# Patient Record
Sex: Male | Born: 1980 | Race: White | Hispanic: No | Marital: Married | Smoking: Former smoker
Health system: Southern US, Community
[De-identification: ages and names within clinical notes are randomized; demographics above are authoritative.]

## PROBLEM LIST (undated history)

## (undated) DIAGNOSIS — C719 Malignant neoplasm of brain, unspecified: Secondary | ICD-10-CM

## (undated) DIAGNOSIS — R569 Unspecified convulsions: Secondary | ICD-10-CM

## (undated) DIAGNOSIS — S82891A Other fracture of right lower leg, initial encounter for closed fracture: Secondary | ICD-10-CM

## (undated) DIAGNOSIS — K8020 Calculus of gallbladder without cholecystitis without obstruction: Secondary | ICD-10-CM

## (undated) HISTORY — DX: Malignant neoplasm of brain, unspecified: C71.9

## (undated) HISTORY — DX: Other fracture of right lower leg, initial encounter for closed fracture: S82.891A

## (undated) HISTORY — PX: ANKLE SURGERY: SHX546

## (undated) HISTORY — DX: Unspecified convulsions: R56.9

## (undated) HISTORY — PX: OTHER SURGICAL HISTORY: SHX169

---
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
## 2002-06-21 ENCOUNTER — Emergency Department (HOSPITAL_COMMUNITY): Admission: EM | Admit: 2002-06-21 | Discharge: 2002-06-22 | Payer: Self-pay | Admitting: Emergency Medicine

## 2004-05-20 ENCOUNTER — Ambulatory Visit: Payer: Self-pay | Admitting: Family Medicine

## 2004-07-24 ENCOUNTER — Ambulatory Visit: Payer: Self-pay | Admitting: Family Medicine

## 2004-10-31 ENCOUNTER — Emergency Department (HOSPITAL_COMMUNITY): Admission: EM | Admit: 2004-10-31 | Discharge: 2004-11-01 | Payer: Self-pay

## 2005-01-06 ENCOUNTER — Ambulatory Visit: Payer: Self-pay | Admitting: Family Medicine

## 2005-01-20 ENCOUNTER — Ambulatory Visit: Payer: Self-pay | Admitting: Family Medicine

## 2005-03-19 ENCOUNTER — Ambulatory Visit: Payer: Self-pay | Admitting: Family Medicine
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
## 2005-07-13 ENCOUNTER — Ambulatory Visit: Payer: Self-pay | Admitting: Family Medicine

## 2005-07-30 ENCOUNTER — Emergency Department (HOSPITAL_COMMUNITY): Admission: EM | Admit: 2005-07-30 | Discharge: 2005-07-30 | Payer: Self-pay | Admitting: Emergency Medicine

## 2005-09-02 ENCOUNTER — Ambulatory Visit: Payer: Self-pay | Admitting: Family Medicine

## 2005-11-12 ENCOUNTER — Ambulatory Visit: Payer: Self-pay | Admitting: Family Medicine

## 2006-08-18 ENCOUNTER — Ambulatory Visit: Payer: Self-pay | Admitting: Family Medicine

## 2006-12-20 ENCOUNTER — Encounter (INDEPENDENT_AMBULATORY_CARE_PROVIDER_SITE_OTHER): Payer: Self-pay | Admitting: Internal Medicine

## 2007-01-26 ENCOUNTER — Ambulatory Visit: Payer: Self-pay | Admitting: Family Medicine

## 2007-01-26 DIAGNOSIS — J019 Acute sinusitis, unspecified: Secondary | ICD-10-CM

## 2007-04-25 ENCOUNTER — Ambulatory Visit: Payer: Self-pay | Admitting: Family Medicine

## 2007-07-20 DIAGNOSIS — S82891A Other fracture of right lower leg, initial encounter for closed fracture: Secondary | ICD-10-CM

## 2007-07-20 HISTORY — DX: Other fracture of right lower leg, initial encounter for closed fracture: S82.891A

## 2007-08-02 ENCOUNTER — Ambulatory Visit: Payer: Self-pay | Admitting: Family Medicine

## 2007-08-02 DIAGNOSIS — F17200 Nicotine dependence, unspecified, uncomplicated: Secondary | ICD-10-CM

## 2007-12-19 ENCOUNTER — Encounter (INDEPENDENT_AMBULATORY_CARE_PROVIDER_SITE_OTHER): Payer: Self-pay | Admitting: Internal Medicine

## 2008-02-03 ENCOUNTER — Encounter: Payer: Self-pay | Admitting: Family Medicine

## 2008-02-03 ENCOUNTER — Emergency Department (HOSPITAL_COMMUNITY): Admission: EM | Admit: 2008-02-03 | Discharge: 2008-02-03 | Payer: Self-pay | Admitting: Emergency Medicine

## 2008-02-15 ENCOUNTER — Ambulatory Visit: Payer: Self-pay | Admitting: Family Medicine

## 2008-02-15 ENCOUNTER — Encounter: Admission: RE | Admit: 2008-02-15 | Discharge: 2008-02-15 | Payer: Self-pay | Admitting: Family Medicine

## 2008-02-15 DIAGNOSIS — J189 Pneumonia, unspecified organism: Secondary | ICD-10-CM

## 2008-02-15 DIAGNOSIS — R109 Unspecified abdominal pain: Secondary | ICD-10-CM | POA: Insufficient documentation

## 2008-03-14 ENCOUNTER — Ambulatory Visit: Payer: Self-pay | Admitting: Gastroenterology

## 2008-03-28 ENCOUNTER — Ambulatory Visit: Payer: Self-pay | Admitting: Gastroenterology

## 2008-03-28 ENCOUNTER — Encounter: Payer: Self-pay | Admitting: Gastroenterology

## 2008-03-29 ENCOUNTER — Telehealth: Payer: Self-pay | Admitting: Gastroenterology

## 2008-04-01 ENCOUNTER — Telehealth: Payer: Self-pay | Admitting: Gastroenterology

## 2008-04-01 ENCOUNTER — Encounter: Payer: Self-pay | Admitting: Gastroenterology

## 2008-04-08 ENCOUNTER — Ambulatory Visit: Payer: Self-pay | Admitting: Family Medicine

## 2008-05-10 ENCOUNTER — Ambulatory Visit: Payer: Self-pay | Admitting: Gastroenterology

## 2008-07-08 ENCOUNTER — Ambulatory Visit: Payer: Self-pay | Admitting: Family Medicine

## 2008-07-08 ENCOUNTER — Encounter: Payer: Self-pay | Admitting: Internal Medicine

## 2008-07-08 DIAGNOSIS — J302 Other seasonal allergic rhinitis: Secondary | ICD-10-CM | POA: Insufficient documentation

## 2008-07-08 DIAGNOSIS — J45909 Unspecified asthma, uncomplicated: Secondary | ICD-10-CM | POA: Insufficient documentation

## 2008-07-08 DIAGNOSIS — J069 Acute upper respiratory infection, unspecified: Secondary | ICD-10-CM | POA: Insufficient documentation

## 2008-07-08 DIAGNOSIS — F988 Other specified behavioral and emotional disorders with onset usually occurring in childhood and adolescence: Secondary | ICD-10-CM | POA: Insufficient documentation

## 2008-09-26 ENCOUNTER — Ambulatory Visit: Payer: Self-pay | Admitting: Family Medicine

## 2008-11-30 ENCOUNTER — Emergency Department (HOSPITAL_COMMUNITY): Admission: EM | Admit: 2008-11-30 | Discharge: 2008-11-30 | Payer: Self-pay | Admitting: Family Medicine

## 2009-01-08 ENCOUNTER — Ambulatory Visit: Payer: Self-pay | Admitting: Family Medicine

## 2009-03-11 ENCOUNTER — Encounter (INDEPENDENT_AMBULATORY_CARE_PROVIDER_SITE_OTHER): Payer: Self-pay | Admitting: Internal Medicine

## 2009-05-08 ENCOUNTER — Telehealth: Payer: Self-pay | Admitting: Family Medicine

## 2009-05-09 ENCOUNTER — Ambulatory Visit: Payer: Self-pay | Admitting: Family Medicine

## 2010-02-12 ENCOUNTER — Encounter: Payer: Self-pay | Admitting: Family Medicine

## 2010-08-18 NOTE — Letter (Signed)
Summary: [facility]  [facility]   Imported By: Lanelle Bal 02/24/2010 11:54:16  _____________________________________________________________________  External Attachment:    Type:   Image     Comment:   External Document

## 2010-10-20 ENCOUNTER — Inpatient Hospital Stay (INDEPENDENT_AMBULATORY_CARE_PROVIDER_SITE_OTHER)
Admission: RE | Admit: 2010-10-20 | Discharge: 2010-10-20 | Disposition: A | Discharge status: Home / Self Care | Payer: BC Managed Care – PPO | Source: Ambulatory Visit | Attending: Emergency Medicine | Admitting: Emergency Medicine

## 2010-10-20 ENCOUNTER — Ambulatory Visit (INDEPENDENT_AMBULATORY_CARE_PROVIDER_SITE_OTHER): Payer: BC Managed Care – PPO

## 2010-10-20 DIAGNOSIS — S335XXA Sprain of ligaments of lumbar spine, initial encounter: Secondary | ICD-10-CM

## 2010-10-20 LAB — POCT URINALYSIS DIP (DEVICE): Specific Gravity, Urine: 1.015 (ref 1.005–1.030)

## 2011-01-22 ENCOUNTER — Encounter: Payer: Self-pay | Admitting: Family Medicine

## 2011-01-22 ENCOUNTER — Ambulatory Visit (INDEPENDENT_AMBULATORY_CARE_PROVIDER_SITE_OTHER): Payer: BC Managed Care – PPO | Admitting: Family Medicine

## 2011-01-22 DIAGNOSIS — R197 Diarrhea, unspecified: Secondary | ICD-10-CM | POA: Insufficient documentation

## 2011-01-22 NOTE — Assessment & Plan Note (Signed)
Suspect viral gastroenteritis Reassuring exam Rev bland / brat diet with sips of fluids and rev s/s dehydration to watch for  Adv to call/ f/u if abd pain/fever/ worse sympt or no imp next week

## 2011-01-22 NOTE — Patient Instructions (Signed)
I think you have a GI virus  Eat bland diet BRAT -- bananas / rice/ applesauce/ toast  Sips of fluids all day long to prevent dehydration  If you feel dehydrated - rapid heart rate/ dizzy/ weak -- go to urgent care or ER to get fluids  Let us know next week if not improving  immodium AD is ok - for 2-3 days maximum

## 2011-01-22 NOTE — Progress Notes (Signed)
Subjective:    Patient ID: Corey Chang, male    DOB: 01-09-1981, 30 y.o.   MRN: [id number]  HPI Diarrhea started yest am  Felt a little bad on wed nt- a bit [employer] on to work and had to come twice - diarrhea was so bad  BM every 2 hours - just watery  Some blood when wiping from hemorroids Some abd cramping -- and also soreness all the time -- is uncomfortable -- really the whole abdomen equally No fever  Little queasy- but no vomiting  Is hungry but if he eats - has to run him to the bathroom  Is drinking fluids well - sprite and water   Ate BBQ chicken on wed -- but felt a little weird before that Got real hot too  No contacts with diarrhea -- and no one else got sick   No headache or tick bite or rash   No abx or new meds lately  Was on aleve and zyrtec D for congestion- feels like ear was clogged up   Patient Active Problem List  Diagnoses  . MALIGNANT NEOPLASM OF VENTRICLES OF BRAIN  . SMOKELESS TOBACCO ABUSE  . ADD  . SINUSITIS, ACUTE  . URI  . ALLERGIC RHINITIS  . PNEUMONIA  . ASTHMA  . SEIZURE DISORDER, COMPLEX PARTIAL  . ABDOMINAL PAIN  . Diarrhea   Past Medical History  Diagnosis Date  . Ankle fracture, right 07/2007  . History of brain tumor   . Aneurysm     history of  . Allergic rhinitis 04/22/97  . Asthma 04/22/97  . History of MRI 10/23/99, 01/18/00    head, stable   Past Surgical History  Procedure Date  . Ankle surgery     right, x 3  . Brain biopsy 1997, 2001   History  Substance Use Topics  . Smoking status: Former Games developer  . Smokeless tobacco: Current User    Types: Chew  . Alcohol Use: Yes     2 beers per week   Family History  Problem Relation Age of Onset  . Heart disease Maternal Grandfather     MI  . Parkinsonism Paternal Grandmother   . Alzheimer's disease Paternal Grandmother   . Depression Neg Hx   . Drug abuse Neg Hx   . Alcohol abuse Neg Hx   . Cancer Neg Hx     no colon, prostate, breast, uterine,ovarian    Allergies  Allergen Reactions  . Azithromycin     REACTION: NAUSEA; DIARRHEA  . Guaifenesin     REACTION: unspecified  . Sulfonamide Derivatives     REACTION: unspecified   No current outpatient prescriptions on file prior to visit.        Review of Systems Review of Systems  Constitutional: Negative for fever, appetite change, fatigue and unexpected weight change.  Eyes: Negative for pain and visual disturbance. ENT pos for ear congestion/ allergies   Respiratory: Negative for cough and shortness of breath.   Cardiovascular: Negative.  for cp or sob Gastrointestinal: Negative for severe abd pain / vomiting / jaundice  Genitourinary: Negative for urgency and frequency.  Skin: Negative for pallor.  Neurological: Negative for weakness, light-headedness, numbness and headaches.  Hematological: Negative for adenopathy. Does not bruise/bleed easily.  Psychiatric/Behavioral: Negative for dysphoric mood. The patient is not nervous/anxious.          Objective:   Physical Exam  Constitutional: He appears well-developed and well-nourished. No distress.  HENT:  Head: Normocephalic and  atraumatic.  Right Ear: External ear normal.  Left Ear: External ear normal.  Mouth/Throat: Oropharynx is clear and moist.       Nares are pale and boggy  Eyes: Conjunctivae and EOM are normal. Pupils are equal, round, and reactive to light. No scleral icterus.  Neck: Normal range of motion. Neck supple. No JVD present. No thyromegaly present.  Cardiovascular: Normal rate, regular rhythm and normal heart sounds.   Pulmonary/Chest: Breath sounds normal. No respiratory distress. He has no wheezes.  Abdominal: Soft. He exhibits no distension and no mass. There is no tenderness. There is no rebound and no guarding.       Mildly hyperactive bs without high pitched/ tinkling noises  Lymphadenopathy:    He has no cervical adenopathy.  Neurological: He is alert. He has normal reflexes.  Skin: Skin is  warm and dry. No rash noted. No erythema. No pallor.       Brisk cap refil time  Psychiatric: He has a normal mood and affect.          Assessment & Plan:

## 2011-04-16 LAB — URINALYSIS, ROUTINE W REFLEX MICROSCOPIC
Glucose, UA: NEGATIVE
Hgb urine dipstick: NEGATIVE
Specific Gravity, Urine: 1.024

## 2011-04-16 LAB — COMPREHENSIVE METABOLIC PANEL
AST: 25
Albumin: 4
Alkaline Phosphatase: 84
BUN: 7
CO2: 27
Chloride: 105
Creatinine, Ser: 0.74
GFR calc Af Amer: >60
GFR calc non Af Amer: >60
Potassium: 3.7
Total Bilirubin: 0.6

## 2011-04-16 LAB — LIPASE, BLOOD: Lipase: 25

## 2011-04-16 LAB — CBC
HCT: 48.1
MCV: 85.8
RBC: 5.61
WBC: 10.2

## 2011-04-16 LAB — DIFFERENTIAL
Basophils Absolute: 0
Basophils Relative: 0
Eosinophils Relative: 4
Lymphocytes Relative: 15
Monocytes Absolute: 1.2 — ABNORMAL HIGH

## 2011-10-14 ENCOUNTER — Encounter: Payer: Self-pay | Admitting: Internal Medicine

## 2011-10-14 ENCOUNTER — Ambulatory Visit (INDEPENDENT_AMBULATORY_CARE_PROVIDER_SITE_OTHER): Payer: BC Managed Care – PPO | Admitting: Internal Medicine

## 2011-10-14 VITALS — BP 120/80 | HR 79 | Temp 98.0°F | Ht 71.0 in | Wt 192.0 lb

## 2011-10-14 DIAGNOSIS — C719 Malignant neoplasm of brain, unspecified: Secondary | ICD-10-CM | POA: Insufficient documentation

## 2011-10-14 DIAGNOSIS — Z23 Encounter for immunization: Secondary | ICD-10-CM

## 2011-10-14 DIAGNOSIS — J45909 Unspecified asthma, uncomplicated: Secondary | ICD-10-CM | POA: Insufficient documentation

## 2011-10-14 MED ORDER — PREDNISONE 20 MG PO TABS: 40.0000 mg | ORAL_TABLET | Freq: Every day | ORAL | Status: AC

## 2011-10-14 MED ORDER — MONTELUKAST SODIUM 10 MG PO TABS: 10.0000 mg | ORAL_TABLET | Freq: Every day | ORAL | Status: DC

## 2011-10-14 NOTE — Assessment & Plan Note (Signed)
Doesn't have apparent asthma---just pollen related Will give 5 days of prednisone and start singulair Continue the other meds

## 2011-10-14 NOTE — Progress Notes (Signed)
Subjective:    Patient ID: Corey Chang, male    DOB: [birth year], 31 y.o.   MRN: [id number]  HPI Has had wheezing in chest and cough Goes back 2 months Mostly irritating Mostly in Am and then better after taking allergy meds  No fever Doesn't smoke Doesn't want to give up smokeless tobacco No sore throat Did get biaxin a month ago for ear infection---walk in clinic This only helped ear  Not SOB--unless he pushes himself (and no recent change in his stamina) Has proair inhaler--this has helped  Current Outpatient Prescriptions on File Prior to Visit  Medication Sig Dispense Refill  . fexofenadine (ALLEGRA) 180 MG tablet Take 180 mg by mouth daily.        . fluticasone (FLONASE) 50 MCG/ACT nasal spray 2 sprays each nostril every morning       . levETIRAcetam (KEPPRA) 500 MG tablet Take 500 mg by mouth every 12 (twelve) hours.          Allergies  Allergen Reactions  . Azithromycin     REACTION: NAUSEA; DIARRHEA  . Guaifenesin     REACTION: unspecified  . Sulfonamide Derivatives     REACTION: unspecified    Past Medical History  Diagnosis Date  . Ankle fracture, right 07/2007  . History of brain tumor   . Aneurysm     history of  . Allergic rhinitis 04/22/97  . Asthma 04/22/97  . History of MRI 10/23/99, 01/18/00    head, stable    Past Surgical History  Procedure Date  . Ankle surgery     right, x 3  . Brain biopsy 1997, 2001    Family History  Problem Relation Age of Onset  . Heart disease Maternal Grandfather     MI  . Parkinsonism Paternal Grandmother   . Alzheimer's disease Paternal Grandmother   . Depression Neg Hx   . Drug abuse Neg Hx   . Alcohol abuse Neg Hx   . Cancer Neg Hx     no colon, prostate, breast, uterine,ovarian    History   Social History  . Marital Status: Married    Spouse Name: N/A    Number of Children: 0  . Years of Education: N/A   Occupational History  . sign erector     DOT for [employer]   Social History Main Topics  . Smoking  status: Former Games developer  . Smokeless tobacco: Current User    Types: Chew  . Alcohol Use: Yes     2 beers per week  . Drug Use: No  . Sexually Active: Not on file   Other Topics Concern  . 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 Patient gets regular exercise.   Review of Systems No vomiting or diarrhea Appetite is fine Did have asthma as child---told only "allergic asthma" by Dr [place] Callas    Objective:   Physical Exam  Constitutional: He appears well-developed and well-nourished. No distress.  HENT:  Mouth/Throat: Oropharynx is clear and moist. No oropharyngeal exudate.       TMs normal Mild nasal congestion  Neck: Normal range of motion. Neck supple.  Pulmonary/Chest: Effort normal and breath sounds normal. No respiratory distress. He has no wheezes. He has no rales.  Lymphadenopathy:    He has no cervical adenopathy.  Skin:       Small well apposed laceration to left thumb. No inflammation          Assessment & Plan:

## 2011-10-14 NOTE — Assessment & Plan Note (Signed)
Discussed that he had biopsy and then RT when he was 19  Due for follow up Doctor in [place] is not there anymore Will set up with oncology locally

## 2011-10-26 ENCOUNTER — Encounter: Payer: Self-pay | Admitting: Internal Medicine

## 2011-10-26 ENCOUNTER — Ambulatory Visit (INDEPENDENT_AMBULATORY_CARE_PROVIDER_SITE_OTHER): Payer: BC Managed Care – PPO | Admitting: Internal Medicine

## 2011-10-26 VITALS — BP 110/70 | HR 87 | Temp 98.3°F | Wt 191.0 lb

## 2011-10-26 DIAGNOSIS — J019 Acute sinusitis, unspecified: Secondary | ICD-10-CM

## 2011-10-26 DIAGNOSIS — J45909 Unspecified asthma, uncomplicated: Secondary | ICD-10-CM

## 2011-10-26 MED ORDER — AMOXICILLIN-POT CLAVULANATE 875-125 MG PO TABS: 1.0000 | ORAL_TABLET | Freq: Two times a day (BID) | ORAL | Status: AC

## 2011-10-26 NOTE — Patient Instructions (Signed)
Call next week if the sinus infection isn't much better

## 2011-10-26 NOTE — Progress Notes (Signed)
  Subjective:    Patient ID: Corey Chang, male    DOB: Aug 13, 1980, 31 y.o.   MRN: [id number]  HPI Seemed to be better at first Now with productive yellow mucus Some wheezing which he relates to the mucus Breathing is better on the singulair  No fever Still has sinus congestion Pain in ears Still with cough  Current Outpatient Prescriptions on File Prior to Visit  Medication Sig Dispense Refill  . fexofenadine (ALLEGRA) 180 MG tablet Take 180 mg by mouth daily.        . fluticasone (FLONASE) 50 MCG/ACT nasal spray 2 sprays each nostril every morning       . levETIRAcetam (KEPPRA) 500 MG tablet Take 500 mg by mouth every 12 (twelve) hours.        . montelukast (SINGULAIR) 10 MG tablet Take 1 tablet (10 mg total) by mouth daily.  30 tablet  11    Allergies  Allergen Reactions  . Azithromycin     REACTION: NAUSEA; DIARRHEA  . Guaifenesin     REACTION: unspecified  . Sulfonamide Derivatives     REACTION: unspecified    Past Medical History  Diagnosis Date  . Ankle fracture, right 07/2007  . History of brain tumor   . Aneurysm     history of  . Allergic rhinitis 04/22/97  . Asthma 04/22/97  . History of MRI 10/23/99, 01/18/00    head, stable    Past Surgical History  Procedure Date  . Ankle surgery     right, x 3  . Brain biopsy 1997, 2001    Family History  Problem Relation Age of Onset  . Heart disease Maternal Grandfather     MI  . Parkinsonism Paternal Grandmother   . Alzheimer's disease Paternal Grandmother   . Depression Neg Hx   . Drug abuse Neg Hx   . Alcohol abuse Neg Hx   . Cancer Neg Hx     no colon, prostate, breast, uterine,ovarian    History   Social History  . Marital Status: Married    Spouse Name: N/A    Number of Children: 0  . Years of Education: N/A   Occupational History  . sign erector     DOT for [employer]   Social History Main Topics  . Smoking status: Former Games developer  . Smokeless tobacco: Current User    Types: Chew  . Alcohol Use: Yes       2 beers per week  . Drug Use: No  . Sexually Active: Not on file   Other Topics Concern  . 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 Patient gets regular exercise.   Review of Systems No rash Appetite is fine     Objective:   Physical Exam  Constitutional: He appears well-developed and well-nourished. No distress.  HENT:  Mouth/Throat: Oropharynx is clear and moist. No oropharyngeal exudate.       No sinus tenderness TMs non inflamed Marked nasal inflammation  Neck: Normal range of motion. Neck supple.  Pulmonary/Chest: Effort normal. No respiratory distress. He has no wheezes. He has no rales.       Some I/E squeeks  Lymphadenopathy:    He has no cervical adenopathy.          Assessment & Plan:

## 2011-10-26 NOTE — Assessment & Plan Note (Signed)
Now seems to have infection Will treat with augmentin then quinolone if not better by next week

## 2011-10-26 NOTE — Assessment & Plan Note (Signed)
Better on the montelukast

## 2011-11-09 ENCOUNTER — Telehealth: Payer: Self-pay | Admitting: *Deleted

## 2011-11-09 NOTE — Telephone Encounter (Signed)
patient confirmed appointment for 11-26-2011 starting at 10:00am

## 2011-11-10 ENCOUNTER — Telehealth: Payer: Self-pay | Admitting: Oncology

## 2011-11-10 NOTE — Telephone Encounter (Signed)
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

## 2011-11-24 ENCOUNTER — Encounter: Payer: Self-pay | Admitting: Oncology

## 2011-11-24 DIAGNOSIS — R569 Unspecified convulsions: Secondary | ICD-10-CM | POA: Insufficient documentation

## 2011-11-26 ENCOUNTER — Ambulatory Visit: Payer: BC Managed Care – PPO

## 2011-11-26 ENCOUNTER — Encounter: Payer: Self-pay | Admitting: Oncology

## 2011-11-26 ENCOUNTER — Ambulatory Visit (HOSPITAL_BASED_OUTPATIENT_CLINIC_OR_DEPARTMENT_OTHER): Payer: BC Managed Care – PPO | Admitting: Oncology

## 2011-11-26 ENCOUNTER — Telehealth: Payer: Self-pay | Admitting: Oncology

## 2011-11-26 ENCOUNTER — Other Ambulatory Visit (HOSPITAL_BASED_OUTPATIENT_CLINIC_OR_DEPARTMENT_OTHER): Payer: BC Managed Care – PPO | Admitting: Lab

## 2011-11-26 VITALS — BP 121/80 | HR 68 | Temp 97.5°F | Ht 69.5 in | Wt 192.3 lb

## 2011-11-26 DIAGNOSIS — R569 Unspecified convulsions: Secondary | ICD-10-CM

## 2011-11-26 DIAGNOSIS — Z85841 Personal history of malignant neoplasm of brain: Secondary | ICD-10-CM

## 2011-11-26 DIAGNOSIS — C719 Malignant neoplasm of brain, unspecified: Secondary | ICD-10-CM

## 2011-11-26 DIAGNOSIS — F17200 Nicotine dependence, unspecified, uncomplicated: Secondary | ICD-10-CM

## 2011-11-26 LAB — COMPREHENSIVE METABOLIC PANEL
AST: 28 U/L (ref 0–37)
Alkaline Phosphatase: 86 U/L (ref 39–117)
BUN: 9 mg/dL (ref 6–23)
Glucose, Bld: 75 mg/dL (ref 70–99)
Sodium: 139 mEq/L (ref 135–145)
Total Bilirubin: 0.6 mg/dL (ref 0.3–1.2)

## 2011-11-26 LAB — CBC WITH DIFFERENTIAL/PLATELET
BASO%: 2 % (ref 0.0–2.0)
Basophils Absolute: 0.1 10*3/uL (ref 0.0–0.1)
EOS%: 7.7 % — ABNORMAL HIGH (ref 0.0–7.0)
HGB: 17 g/dL (ref 13.0–17.1)
MCH: 29.5 pg (ref 27.2–33.4)
MCHC: 34.5 g/dL (ref 32.0–36.0)
MCV: 85.3 fL (ref 79.3–98.0)
MONO%: 12.9 % (ref 0.0–14.0)
NEUT%: 36.7 % — ABNORMAL LOW (ref 39.0–75.0)
RDW: 14.4 % (ref 11.0–14.6)
lymph#: 2.8 10*3/uL (ref 0.9–3.3)

## 2011-11-26 NOTE — Telephone Encounter (Signed)
appts made and printed for pt aom °

## 2011-11-26 NOTE — Consult Note (Signed)
Corey Chang  [phone] Fax:([phone]) [phone]   MEDICAL ONCOLOGY - INITIAL CONSULATION   Referral MD:  Dr. Hannah Chang, M.D.  Reason for Referral:  History of left temporal glioma.     HPI:   [name] is a 31 year-old man with history of left temporal glioma in 12/1999.  He has been lost to follow up with his original treating radiation oncologist Dr. Loralyn Chang from [facility].  His PCP recently realized that patient had not had follow up and thus kindly referred patient to the Cancer to establish care.   [name] had seizures and headache in Jan 1998.  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  He was evaluated by Dr. Casilda Chang.   He was followed with serial MRI's and was found to have persistent edema.  Thus, he underwent sterotactic biopsy of the lesion in 06/1998 with pathology showing low-grade glioma, grade I.  In April 2001, brain MRI showed interval development of enhancement in the existing nodules in the right thalamus and right cerebral peduncle with extension into the right medial temporal lobe.  He underwent on 01/06/2000 repeat biopsy at [facility] which showed low grade glioma.  He underwent adjuvant radiation therapy on Corey protocol Chang, total 54 Gy in 30 fractions.  He was randomized on this protocol to the arm that was supposed to receive chemotherapy; however, he declined.  He las saw Dr. Clelia Chang at [facility] on 12/29/2005 and was in remission at that time.  He was advised to have yearly follow up; however, he was lost to follow up.   [name] presented to the [facility] for the first time today with his aunt.  He reports feeling well.  His PCP recently attempted to taper down his Corey Chang; however, he developed seizure a few days after.  Thus, his Corey Chang dose was increased back to BID dosing; and he has been doing well.  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    Patient denies fatigue, fever, anorexia, weight loss, headache, visual changes, confusion, drenching night sweats, palpable lymph node swelling, mucositis, odynophagia, dysphagia, nausea vomiting, jaundice, chest pain, palpitation, dyspnea on exertion, productive cough, gum bleeding, epistaxis, hematemesis, hemoptysis, abdominal pain, abdominal swelling, early satiety, melena, hematochezia, hematuria, skin rash, spontaneous bleeding, joint swelling, joint pain, heat or cold intolerance, bowel bladder incontinence, back pain, focal motor weakness, paresthesia, depression, suicidal or homocidal ideation, feeling hopelessness.  He has mild wheezing from allergy but no increased work of breathing.     Past Medical History  Diagnosis Date  . Ankle fracture, right 07/2007  . 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  He had external beam radiation in August 2001 with 54 Gy in 30 fractions on Corey Chang protocol.   He declined chemotherapy.   . Aneurysm     history of that turned out to be oligodendroglioma  . Allergic rhinitis 04/22/97  . Asthma 04/22/97  . Seizure     from history of oligodendroglioma  :  Past Surgical History  Procedure Date  . Ankle surgery     right, x 3  . Brain biopsy 1997, 2001  :  Current Outpatient Prescriptions  Medication Sig Dispense Refill  . fexofenadine (ALLEGRA) 180 MG tablet Take 180 mg by mouth daily.        . fluticasone (  FLONASE) 50 MCG/ACT nasal spray 2 sprays each nostril every morning       . levETIRAcetam (KEPPRA) 500 MG tablet Take 500 mg by mouth every 12 (twelve) hours.        Marland Kitchen loratadine (CLARITIN) 10 MG tablet Take 10 mg by mouth daily.      . montelukast (SINGULAIR) 10 MG tablet Take 1 tablet (10 mg total) by mouth daily.  30 tablet  11  . PROAIR HFA 108 (90 BASE) MCG/ACT inhaler As needed         Allergies  Allergen Reactions  . Sulfonamide  Derivatives     REACTION: unspecified  :  Family History  Problem Relation Age of Onset  . Heart disease Maternal Grandfather     MI  . Parkinsonism Paternal Grandmother   . Alzheimer's disease Paternal Grandmother   . Depression Neg Hx   . Drug abuse Neg Hx   . Alcohol abuse Neg Hx   . Cancer Neg Hx     no colon, prostate, breast, uterine,ovarian  :  History   Social History  . Marital Status: Married    Spouse Name: N/A    Number of Children: 1  . Years of Education: N/A   Occupational History  . 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     Social History Main Topics  . Smoking status: Former Games developer  . Smokeless tobacco: Current User    Types: Chew  . Alcohol Use: Yes     2 beers per week  . Drug Use: No  . Sexually Active: Not on file   Other Topics Concern  . 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 Patient gets regular exercise.  :  Pertinent items are noted in HPI.  Exam: ECOG 0.   General:  well-nourished in no acute distress.  Eyes:  no scleral icterus.  ENT:  There were no oropharyngeal lesions.  Neck was without thyromegaly.  Lymphatics:  Negative cervical, supraclavicular or axillary adenopathy.  Respiratory: lungs were clear bilaterally without wheezing or crackles.  Cardiovascular:  Regular rate and rhythm, S1/S2, without murmur, rub or gallop.  There was no pedal edema.  GI:  abdomen was soft, flat, nontender, nondistended, without organomegaly.  Muscoloskeletal:  no spinal tenderness of palpation of vertebral spine.  Skin exam was without echymosis, petichae.  Neuro exam was nonfocal. CN II-XII grossly intact.  Motor strength 5/5 bilaterally.  There was no dysmetria.  Patient was able to get on and off exam table without assistance.  Gait was normal.  Patient was alerted and oriented.  Attention was good.   Language was appropriate.  Mood was normal without depression.  Speech was not  pressured.  Thought content was not tangential.     Lab Results  Component Value Date   WBC 6.8 11/26/2011   HGB 17.0 11/26/2011   HCT 49.3 11/26/2011   PLT 224 11/26/2011   GLUCOSE 104* 02/03/2008   ALT 28 02/03/2008   AST 25 02/03/2008   NA 139 02/03/2008   K 3.7 02/03/2008   CL 105 02/03/2008   CREATININE 0.74 02/03/2008   BUN 7 02/03/2008   CO2 27 02/03/2008    Assessment and Plan:   1.  History of left temporal glioma:   - s/p resection in 2001; s/p adjuvant radiation therapy.  He has been in remission.  There has been no residual side  effects.  - Clinically today on exam, there was no sign of recurrence.  I recommended brain MRI with and without IV contrast to ensure no recurrent disease.  He has follow up appointment in about 1 year.  I recommended about every 2 year brain MRI but sooner if he has concerning symptoms.   2.  Seizure:  Continue with Corey Chang 500mg  PO BID.  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   I advised him not to drink EtOH while taking Corey Chang.   3.  Chewing tobacco:  I strongly urged him to stop this since it can increase his risk of oral cancer.      Thank you for this referral.   The length of time of the face-to-face encounter was 30  minutes. More than 50% of time was spent counseling and coordination of care.

## 2011-11-26 NOTE — Progress Notes (Signed)
See consult note

## 2011-11-30 ENCOUNTER — Ambulatory Visit (HOSPITAL_COMMUNITY)
Admission: RE | Admit: 2011-11-30 | Discharge: 2011-11-30 | Disposition: A | Discharge status: Home / Self Care | Payer: BC Managed Care – PPO | Source: Ambulatory Visit | Attending: Oncology | Admitting: Oncology

## 2011-11-30 DIAGNOSIS — R569 Unspecified convulsions: Secondary | ICD-10-CM

## 2011-11-30 DIAGNOSIS — Z923 Personal history of irradiation: Secondary | ICD-10-CM | POA: Insufficient documentation

## 2011-11-30 DIAGNOSIS — Q283 Other malformations of cerebral vessels: Secondary | ICD-10-CM | POA: Insufficient documentation

## 2011-11-30 DIAGNOSIS — C719 Malignant neoplasm of brain, unspecified: Secondary | ICD-10-CM | POA: Insufficient documentation

## 2011-11-30 MED ORDER — GADOBENATE DIMEGLUMINE 529 MG/ML IV SOLN
18.0000 mL | Freq: Once | INTRAVENOUS | Status: AC | PRN
  Administered 2011-11-30: 18 mL via INTRAVENOUS

## 2011-12-01 ENCOUNTER — Telehealth: Payer: Self-pay | Admitting: *Deleted

## 2011-12-01 NOTE — Telephone Encounter (Signed)
Message copied by Wende Mott on Wed Dec 01, 2011 11:13 AM ------      Message from: HA, Raliegh Ip T      Created: Wed Dec 01, 2011  9:42 AM       Please call patient.  We will need to get previous MRI at [facility] burned on a CD-ROM for our radiologist for review.  Please contact  [facility] to get the CD Rom.            Thanks.

## 2011-12-01 NOTE — Telephone Encounter (Signed)
Received call back from Med Records at [facility] and requested MRI on CD Rom.  Was done there on 12/29/2005.   Faxed them a Release of Information (fax [phone]) for CD Rom of MRI brain from 12/29/2005 to be mailed to [place] asap.

## 2011-12-01 NOTE — Telephone Encounter (Signed)
Called [facility] (ph# [phone]) and spoke w/ Duffy Rhody.  Requested most recent MRI brain on CD be sent to [place] at [facility] then we can forward to our Radiology dept so they can compare to MRI done here.   Called pt and he says he does not have MRI on CD.  Informed him we are waiting for it to be sent from [facility] so we can compare to new MRI.  He verbalized understanding.

## 2011-12-01 NOTE — Telephone Encounter (Signed)
Received VM from Rad Onc dept at [facility] stating they do not have record of MRI in past 5 years.   I called [facility] Med Record dept and left a VM requesting call back regarding trying to get MRI brain on disc for our Radiologist to review.  Waiting for call back.

## 2011-12-09 ENCOUNTER — Telehealth: Payer: Self-pay | Admitting: Internal Medicine

## 2011-12-09 ENCOUNTER — Other Ambulatory Visit: Payer: Self-pay | Admitting: Oncology

## 2011-12-09 ENCOUNTER — Telehealth: Payer: Self-pay | Admitting: *Deleted

## 2011-12-09 ENCOUNTER — Encounter: Payer: Self-pay | Admitting: *Deleted

## 2011-12-09 DIAGNOSIS — C719 Malignant neoplasm of brain, unspecified: Secondary | ICD-10-CM

## 2011-12-09 MED ORDER — AMOXICILLIN-POT CLAVULANATE 875-125 MG PO TABS: 1.0000 | ORAL_TABLET | Freq: Two times a day (BID) | ORAL | Status: DC

## 2011-12-09 NOTE — Telephone Encounter (Signed)
Patient is blowing green mucous,congestion,cough,left ear pain.  Patient said he has been seen several times since the beginning of the year.  Patient wants to know if a rx can be called in to [facility] or does he need to be seen?

## 2011-12-09 NOTE — Telephone Encounter (Signed)
VM from Dr. Chestine Spore,  States he received the CD of MRI Brain for comparison.  He will dictate an addendum to MRI report but Dr. Gaylyn Rong may call him if he wants to discuss over the phone.  [phone].

## 2011-12-09 NOTE — Telephone Encounter (Signed)
I call patient and personally talked with him on the phone today.  I relayed to him the discussion I had with the radiologist Dr. Charisse Klinefelter.  Compared this last brain MRI to the ones in 2005, 2006, and 2007, there has been a slight enlargement of the cyst in the right temporal area.   He is still asymptomatic.  He preferred to establish care with Neuro Surgery here in town.  I referred him to Dr. Venetia Maxon who is active in the brain malignancy program (in case he needs repeat radiation) again.

## 2011-12-09 NOTE — Telephone Encounter (Signed)
Okay to have him try the augmentin again as long as he was improved after the visit in April 875mg  bid  #20 x 0 Will need to be seen next week if not improving

## 2011-12-09 NOTE — Telephone Encounter (Signed)
Patient advised as instructed via telephone, he stated that the Augmentin did help in April.  Rx sent to [facility] electronically.

## 2011-12-09 NOTE — Progress Notes (Signed)
Recevied MRI Brain on CD dates 04/05/05, 12/29/05 and 01/06/04 from [facility]. Boyd CD to [facility] Radiology dept and gave to Fay Records who will arrange for disc to be taken to [facility] today and given to Dr. Janeece Riggers for review,, to compare to recent MRI Brain done at [facility].

## 2011-12-10 ENCOUNTER — Telehealth: Payer: Self-pay | Admitting: *Deleted

## 2011-12-10 ENCOUNTER — Telehealth: Payer: Self-pay | Admitting: Oncology

## 2011-12-10 NOTE — Telephone Encounter (Signed)
called Dr. Rush Farmer office lmovm with new pt cor with pts info and informed her that we fax over info needed to schedule appt.  will inform pt of call and that he wil receive a call from there office with appt d/t

## 2011-12-10 NOTE — Telephone Encounter (Signed)
Pt called to clarify results of his MRI and reason for referral to neurosurgeon.  Reviewed results w/ pt that Dr. Gaylyn Rong had explained already and that Neurosurgeon's opinion if pt needs biopsy vs. Surgery vs. Observation of slightly enlarged cystic appearing lesion.  Pt verbalized understanding and states will keep appt w/ neurosurgeon as scheduled.

## 2012-03-01 ENCOUNTER — Encounter: Payer: Self-pay | Admitting: Internal Medicine

## 2012-03-01 ENCOUNTER — Ambulatory Visit (INDEPENDENT_AMBULATORY_CARE_PROVIDER_SITE_OTHER): Payer: BC Managed Care – PPO | Admitting: Internal Medicine

## 2012-03-01 VITALS — BP 120/80 | HR 71 | Temp 97.9°F | Wt 195.0 lb

## 2012-03-01 DIAGNOSIS — J019 Acute sinusitis, unspecified: Secondary | ICD-10-CM

## 2012-03-01 MED ORDER — AMOXICILLIN-POT CLAVULANATE 875-125 MG PO TABS: 1.0000 | ORAL_TABLET | Freq: Two times a day (BID) | ORAL | Status: AC

## 2012-03-01 NOTE — Assessment & Plan Note (Signed)
Seems like this is just a viral infection Discussed typical time course Will continue supportive care If worsens, or persists to next week, will start the augmentin rx

## 2012-03-01 NOTE — Progress Notes (Signed)
  Subjective:    Patient ID: Corey Chang, male    DOB: 1981-05-12, 31 y.o.   MRN: [id number]  HPI Started with respiratory virus about 5 days ago Had clear rhinorrhea Then last night---turned yellow  Sudafed and guaifenisin---slight help  No fever Ears are clogged Cough with mucus---seems to be from PND No sig sore throat Does have frontal headache---using Goody powders  Current Outpatient Prescriptions on File Prior to Visit  Medication Sig Dispense Refill  . fluticasone (FLONASE) 50 MCG/ACT nasal spray 2 sprays each nostril every morning       . levETIRAcetam (KEPPRA) 500 MG tablet Take 500 mg by mouth every 12 (twelve) hours.        Marland Kitchen PROAIR HFA 108 (90 BASE) MCG/ACT inhaler As needed        No Known Allergies  Past Medical History  Diagnosis Date  . Ankle fracture, right 07/2007  . 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  He had external beam radiation in August 2001 with 54 Gy in 30 fractions on RTOG 9802 protocol.   He declined chemotherapy.   . Aneurysm     history of that turned out to be oligodendroglioma  . Allergic rhinitis 04/22/97  . Asthma 04/22/97  . 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 Ankle surgery     right, x 3  . Brain biopsy 1997, 2001    Family History  Problem Relation Age of Onset  . Heart disease Maternal Grandfather     MI  . Parkinsonism Paternal Grandmother   . Alzheimer's disease Paternal Grandmother   . Depression Neg Hx   . Drug abuse Neg Hx   . Alcohol abuse Neg Hx   . Cancer Neg Hx     no colon, prostate, breast, uterine,ovarian    History   Social History  . Marital Status: Married    Spouse Name: N/A    Number of Children: 1  . Years of Education: N/A   Occupational History  . 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     Social History Main Topics  . Smoking status: Former Games developer  . Smokeless tobacco: Current User      Types: Chew  . Alcohol Use: Yes     2 beers per week  . Drug Use: No  . Sexually Active: Not on file   Other Topics Concern  . 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 Patient gets regular exercise.      Review of Systems Vomited once 2 days ago No stomach pain Eating okay    Objective:   Physical Exam  Constitutional: He appears well-developed and well-nourished. No distress.  HENT:  Mouth/Throat: Oropharynx is clear and moist. No oropharyngeal exudate.       TMs normal Mild right maxillary tenderness Mild nasal inflammation and swelling  Neck: Normal range of motion. Neck supple.  Pulmonary/Chest: Effort normal and breath sounds normal. No respiratory distress. He has no wheezes. He has no rales.  Lymphadenopathy:    He has no cervical adenopathy.          Assessment & Plan:

## 2012-03-28 ENCOUNTER — Telehealth: Payer: Self-pay | Admitting: Oncology

## 2012-03-28 NOTE — Telephone Encounter (Signed)
Faxed records to [facility] @ [place] [phone]

## 2012-05-04 ENCOUNTER — Ambulatory Visit (INDEPENDENT_AMBULATORY_CARE_PROVIDER_SITE_OTHER): Payer: BC Managed Care – PPO | Admitting: Family Medicine

## 2012-05-04 ENCOUNTER — Encounter: Payer: Self-pay | Admitting: Family Medicine

## 2012-05-04 VITALS — BP 116/76 | HR 80 | Temp 98.6°F | Wt 201.5 lb

## 2012-05-04 DIAGNOSIS — J019 Acute sinusitis, unspecified: Secondary | ICD-10-CM

## 2012-05-04 MED ORDER — AMOXICILLIN-POT CLAVULANATE 875-125 MG PO TABS: 1.0000 | ORAL_TABLET | Freq: Two times a day (BID) | ORAL | Status: AC

## 2012-05-04 NOTE — Progress Notes (Signed)
  Subjective:    Patient ID: Corey Chang, male    DOB: [birth year], 31 y.o.   MRN: [id number]  HPI CC: sinusitis?  5d h/o significant sinus congestion, constant PNdrainage, yellow/green mucous that leads to productive cough.  Worse congestion at night time.  + HA.  Mild ear and tooth pain.  + ST from drainage.  + coryza and rhinorrhea.  No fevers/chills, abd pain, nausea.  Taking alleve, mucinex.    Tends to get recurrent sinusitis. No sick contacts at home.  No smokers at home. H/o seasonal allergies and allergy related asthma - usually flonase works well for this.  Uses albuterol PRN.  Past Medical History  Diagnosis Date  . Ankle fracture, right 07/2007  . 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  He had external beam radiation in August 2001 with 54 Gy in 30 fractions on RTOG 9802 protocol.   He declined chemotherapy.   . Aneurysm     history of that turned out to be oligodendroglioma  . Allergic rhinitis 04/22/97  . Asthma 04/22/97  . 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 Constitutional: He appears well-developed and well-nourished. No distress.  HENT:  Head: Normocephalic and atraumatic.  Right Ear: Hearing, tympanic membrane, external ear and ear canal normal.  Left Ear: Hearing, tympanic membrane, external ear and ear canal normal.  Nose: No mucosal edema or rhinorrhea. Right sinus exhibits maxillary sinus tenderness. Right sinus exhibits no frontal sinus tenderness. Left sinus exhibits maxillary sinus tenderness. Left sinus exhibits no frontal sinus tenderness.  Mouth/Throat: Uvula is midline, oropharynx is clear and moist and mucous membranes are normal. No oropharyngeal exudate, posterior oropharyngeal edema, posterior oropharyngeal erythema or tonsillar abscesses.       Evidently congested  Eyes: Conjunctivae normal and EOM are normal. Pupils are equal,  round, and reactive to light. No scleral icterus.  Neck: Normal range of motion. Neck supple.  Cardiovascular: Normal rate, regular rhythm, normal heart sounds and intact distal pulses.   No murmur heard. Pulmonary/Chest: Effort normal and breath sounds normal. No respiratory distress. He has no wheezes. He has no rales.       Cough present  Lymphadenopathy:    He has no cervical adenopathy.  Skin: Skin is warm and dry. No rash noted.       Assessment & Plan:

## 2012-05-04 NOTE — Patient Instructions (Signed)
You have a sinus infection, likely viral. Take medicine as prescribed if going on past 10 days, sudden worsening, or fever >101. Otherwise continue with meds as up to now. Push fluids and plenty of rest. Nasal saline irrigation or neti pot to help drain sinuses. May use simple mucinex with plenty of fluid to help mobilize mucous. Let us know if fever >101.5, trouble opening/closing mouth, difficulty swallowing, or worsening - you may need to be seen again.

## 2012-05-04 NOTE — Assessment & Plan Note (Signed)
Given duration, likely viral. Supportive care continued as up to now. Discussed sxs to be concerned for bacterial superinfection - provided with WASP for augmentin in case concern for bacterial sinusitis. Pt agrees with plan.

## 2012-08-18 ENCOUNTER — Other Ambulatory Visit: Payer: Self-pay | Admitting: Neurosurgery

## 2012-08-18 DIAGNOSIS — D496 Neoplasm of unspecified behavior of brain: Secondary | ICD-10-CM

## 2012-08-25 ENCOUNTER — Ambulatory Visit
Admission: RE | Admit: 2012-08-25 | Discharge: 2012-08-25 | Disposition: A | Discharge status: Home / Self Care | Payer: BC Managed Care – PPO | Source: Ambulatory Visit | Attending: Neurosurgery | Admitting: Neurosurgery

## 2012-08-25 DIAGNOSIS — D496 Neoplasm of unspecified behavior of brain: Secondary | ICD-10-CM

## 2012-08-25 MED ORDER — GADOBENATE DIMEGLUMINE 529 MG/ML IV SOLN
19.0000 mL | Freq: Once | INTRAVENOUS | Status: AC | PRN
  Administered 2012-08-25: 19 mL via INTRAVENOUS

## 2012-09-28 ENCOUNTER — Ambulatory Visit (INDEPENDENT_AMBULATORY_CARE_PROVIDER_SITE_OTHER): Payer: BC Managed Care – PPO | Admitting: Family Medicine

## 2012-09-28 ENCOUNTER — Encounter: Payer: Self-pay | Admitting: *Deleted

## 2012-09-28 ENCOUNTER — Encounter: Payer: Self-pay | Admitting: Family Medicine

## 2012-09-28 VITALS — BP 118/78 | HR 70 | Temp 97.8°F | Ht 69.5 in | Wt 199.0 lb

## 2012-09-28 DIAGNOSIS — J069 Acute upper respiratory infection, unspecified: Secondary | ICD-10-CM

## 2012-09-28 DIAGNOSIS — J45909 Unspecified asthma, uncomplicated: Secondary | ICD-10-CM

## 2012-09-28 NOTE — Assessment & Plan Note (Signed)
No ongoing asthma exacerbation.

## 2012-09-28 NOTE — Patient Instructions (Addendum)
Mucinex DM, nasal saline spray 2-3 times daily. Call if not following viral course as expected, ie. If worsening as opposed to improving in next 3-4  Days.  Call sooner if shortness of breath or wheezing, use albuterol prn.

## 2012-09-28 NOTE — Assessment & Plan Note (Signed)
Symptomatic care. No clear sign of ongoing bacterial infection.

## 2012-09-28 NOTE — Progress Notes (Signed)
  Subjective:    Patient ID: Corey Chang, male    DOB: 1981/05/07, 32 y.o.   MRN: [id number]  Cough This is a new problem. Episode onset: 4 days. The cough is productive of sputum. Associated symptoms include headaches, postnasal drip, rhinorrhea and a sore throat. Pertinent negatives include no fever, shortness of breath or wheezing. Treatments tried: Flonase, Zyrtec, and Aleve daily. Mucinex and Albuterol x 2 days. His past medical history is significant for asthma.      Review of Systems  Constitutional: Negative for fever.  HENT: Positive for sore throat, rhinorrhea and postnasal drip.        Occasional ear pain  Respiratory: Positive for cough. Negative for shortness of breath and wheezing.   Neurological: Positive for headaches.       Objective:   Physical Exam  Constitutional: Vital signs are normal. He appears well-developed and well-nourished.  Non-toxic appearance. He does not appear ill. No distress.  HENT:  Head: Normocephalic and atraumatic.  Right Ear: Hearing, tympanic membrane, external ear and ear canal normal. No tenderness. No foreign bodies. Tympanic membrane is not retracted and not bulging.  Left Ear: Hearing, tympanic membrane, external ear and ear canal normal. No tenderness. No foreign bodies. Tympanic membrane is not retracted and not bulging.  Nose: Nose normal. No mucosal edema or rhinorrhea. Right sinus exhibits no maxillary sinus tenderness and no frontal sinus tenderness. Left sinus exhibits no maxillary sinus tenderness and no frontal sinus tenderness.  Mouth/Throat: Uvula is midline, oropharynx is clear and moist and mucous membranes are normal. Normal dentition. No dental caries. No oropharyngeal exudate or tonsillar abscesses.  Eyes: Conjunctivae, EOM and lids are normal. Pupils are equal, round, and reactive to light. No foreign bodies found.  Neck: Trachea normal, normal range of motion and phonation normal. Neck supple. Carotid bruit is not present. No  mass and no thyromegaly present.  Cardiovascular: Normal rate, regular rhythm, S1 normal, S2 normal, normal heart sounds, intact distal pulses and normal pulses.  Exam reveals no gallop.   No murmur heard. Pulmonary/Chest: Effort normal and breath sounds normal. No respiratory distress. He has no wheezes. He has no rhonchi. He has no rales.  Abdominal: Soft. Normal appearance and bowel sounds are normal. There is no hepatosplenomegaly. There is no tenderness. There is no rebound, no guarding and no CVA tenderness. No hernia.  Lymphadenopathy:    He has no cervical adenopathy.  Neurological: He is alert. He has normal reflexes.  Skin: Skin is warm, dry and intact. No rash noted.  Psychiatric: He has a normal mood and affect. His speech is normal and behavior is normal. Judgment normal.          Assessment & Plan:

## 2012-10-10 ENCOUNTER — Telehealth: Payer: Self-pay

## 2012-10-10 MED ORDER — AZITHROMYCIN 250 MG PO TABS: ORAL_TABLET | ORAL | Status: DC

## 2012-10-10 NOTE — Telephone Encounter (Signed)
Pt left note that pt seen 09/28/12; pt said if did not improved in a week pt was to call back and would not have to see a doctor to get an antibiotic. Pt said has been 11 days and pt said he feels worse not just congestion in head but also in chest; pt has tightness in chest with productive cough with yellow phlegm. No fever or SOB. Pt said slight chest pain when coughs or takes deep breath. [place]. Pt has trouble swallowing large pills. Pt request call back.

## 2012-10-10 NOTE — Telephone Encounter (Signed)
Patient advised.

## 2012-10-10 NOTE — Telephone Encounter (Signed)
Sent in rx for azithro x 5 days. Let me know if he is having SOB or wheeze.

## 2012-10-11 NOTE — Progress Notes (Signed)
Received office notes from Dr. [name] from [facility]; forwarded to Dr. Gaylyn Rong

## 2012-10-30 ENCOUNTER — Telehealth: Payer: Self-pay | Admitting: *Deleted

## 2012-10-30 NOTE — Telephone Encounter (Signed)
Patient wants refills on singulair but this is not on medication list is it okay to refill?

## 2012-10-30 NOTE — Telephone Encounter (Signed)
He has been on this Okay to refill #30 x 11 10mg  Take daily for severe allergy symptoms

## 2012-10-31 MED ORDER — MONTELUKAST SODIUM 10 MG PO TABS: 10.0000 mg | ORAL_TABLET | Freq: Every day | ORAL | Status: DC

## 2012-10-31 NOTE — Telephone Encounter (Signed)
rx sent to pharmacy by e-script  

## 2012-11-24 ENCOUNTER — Other Ambulatory Visit (HOSPITAL_BASED_OUTPATIENT_CLINIC_OR_DEPARTMENT_OTHER): Payer: BC Managed Care – PPO | Admitting: Lab

## 2012-11-24 ENCOUNTER — Ambulatory Visit (HOSPITAL_BASED_OUTPATIENT_CLINIC_OR_DEPARTMENT_OTHER): Payer: BC Managed Care – PPO | Admitting: Oncology

## 2012-11-24 ENCOUNTER — Encounter: Payer: Self-pay | Admitting: Oncology

## 2012-11-24 VITALS — BP 136/79 | HR 74 | Temp 98.2°F | Resp 20 | Ht 69.5 in | Wt 195.6 lb

## 2012-11-24 DIAGNOSIS — F17200 Nicotine dependence, unspecified, uncomplicated: Secondary | ICD-10-CM

## 2012-11-24 DIAGNOSIS — Z85841 Personal history of malignant neoplasm of brain: Secondary | ICD-10-CM

## 2012-11-24 DIAGNOSIS — R569 Unspecified convulsions: Secondary | ICD-10-CM

## 2012-11-24 DIAGNOSIS — C719 Malignant neoplasm of brain, unspecified: Secondary | ICD-10-CM

## 2012-11-24 LAB — COMPREHENSIVE METABOLIC PANEL (CC13)
ALT: 19 U/L (ref 0–55)
AST: 20 U/L (ref 5–34)
Albumin: 4.1 g/dL (ref 3.5–5.0)
Alkaline Phosphatase: 92 U/L (ref 40–150)
BUN: 10.6 mg/dL (ref 7.0–26.0)
CO2: 22 meq/L (ref 22–29)
Calcium: 9.4 mg/dL (ref 8.4–10.4)
Chloride: 106 meq/L (ref 98–107)
Creatinine: 1 mg/dL (ref 0.7–1.3)
Glucose: 89 mg/dL (ref 70–99)
Potassium: 3.7 meq/L (ref 3.5–5.1)
Sodium: 140 meq/L (ref 136–145)
Total Bilirubin: 0.39 mg/dL (ref 0.20–1.20)
Total Protein: 7.5 g/dL (ref 6.4–8.3)

## 2012-11-24 LAB — CBC WITH DIFFERENTIAL/PLATELET
BASO%: 1.4 % (ref 0.0–2.0)
Basophils Absolute: 0.1 10*3/uL (ref 0.0–0.1)
HGB: 16.8 g/dL (ref 13.0–17.1)
LYMPH%: 44.7 % (ref 14.0–49.0)
MCH: 28.7 pg (ref 27.2–33.4)
MONO%: 11 % (ref 0.0–14.0)
NEUT#: 2.3 10*3/uL (ref 1.5–6.5)
NEUT%: 37.2 % — ABNORMAL LOW (ref 39.0–75.0)
RBC: 5.84 10*6/uL — ABNORMAL HIGH (ref 4.20–5.82)
RDW: 13.8 % (ref 11.0–14.6)
lymph#: 2.7 10*3/uL (ref 0.9–3.3)

## 2012-11-24 NOTE — Progress Notes (Signed)
[facility]  Telephone:([phone]) [phone] Fax:([phone]) [phone]   OFFICE PROGRESS NOTE   Cc:  [name], MD  DIAGNOSIS: History of left temporal glioma in 12/1999. He was treated by Dr Loralyn Freshwater from [facility].  PAST THERAPY:  1. He underwent sterotactic biopsy of the lesion in 06/1998 with pathology showing low-grade glioma, grade I 2. In April 2001, brain MRI showed interval development of enhancement in the existing nodules in the right thalamus and right cerebral peduncle with extension into the right medial temporal lobe. He underwent on 01/06/2000 repeat biopsy at [facility] which showed low grade glioma. He underwent adjuvant radiation therapy on RTOG protocol 9802, total 54 Gy in 30 fractions. He was randomized on this protocol to the arm that was supposed to receive chemotherapy; however, he declined.  CURRENT THERAPY: Watchful observation  INTERVAL HISTORY: Corey Chang 32 y.o. male returns for routine follow-up. He has continued to do well over the past year. He is followed by Dr Venetia Maxon who is planning on doing another MRI at the end of this year. No headaches, dizziness, changes in vision. No chest pain, SOB, DOE. No abdominal pain, nausea, vomiting. No weakness in his extremities.   Past Medical History  Diagnosis Date  . Ankle fracture, right 07/2007  . 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  He had external beam radiation in August 2001 with 54 Gy in 30 fractions on RTOG 9802 protocol.   He declined chemotherapy.   . Aneurysm     history of that turned out to be oligodendroglioma  . Allergic rhinitis 04/22/97  . Asthma 04/22/97  . 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 Ankle surgery      right, x 3  . Brain biopsy  1997, 2001    Current Outpatient Prescriptions  Medication Sig Dispense Refill  . fluticasone (FLONASE) 50 MCG/ACT nasal spray 2 sprays each nostril every morning        . ibuprofen (ADVIL,MOTRIN) 200 MG tablet Take 200 mg by mouth every 6 (six) hours as needed.      . levETIRAcetam (KEPPRA) 500 MG tablet Take by mouth every 12 (twelve) hours. 500 mg every AM and 1000 mg every PM      . montelukast (SINGULAIR) 10 MG tablet Take 1 tablet (10 mg total) by mouth at bedtime.  30 tablet  11  . PROAIR HFA 108 (90 BASE) MCG/ACT inhaler As needed       No current facility-administered medications for this visit.    ALLERGIES:  has No Known Allergies.  REVIEW OF SYSTEMS:  The rest of the 14-point review of system was negative.   Filed Vitals:   11/24/12 0838  BP: 136/79  Pulse: 74  Temp: 98.2 F (36.8 C)  Resp: 20   Wt Readings from Last 3 Encounters:  11/24/12 195 lb 9.6 oz (88.724 kg)  09/28/12 199 lb (90.266 kg)  05/04/12 201 lb 8 oz (91.4 kg)   ECOG Performance status: 0  PHYSICAL EXAMINATION:   General: well-nourished in no acute distress. Eyes: no scleral icterus. ENT: There were no oropharyngeal lesions. Neck was without thyromegaly. Lymphatics: Negative cervical, supraclavicular or axillary adenopathy. Respiratory: lungs were clear bilaterally without wheezing or crackles. Cardiovascular: Regular rate and rhythm, S1/S2, without murmur, rub or gallop. There was no pedal edema. GI: abdomen was soft, flat, nontender, nondistended, without organomegaly. Muscoloskeletal: no spinal tenderness of  palpation of vertebral spine. Skin exam was without echymosis, petichae. Neuro exam was nonfocal. CN II-XII grossly intact. Motor strength 5/5 bilaterally. There was no dysmetria. Patient was able to get on and off exam table without assistance. Gait was normal. Patient was alerted and oriented. Attention was good. Language was appropriate. Mood was normal without depression. Speech was not pressured. Thought content was not tangential.    LABORATORY/RADIOLOGY DATA:  Lab Results  Component Value Date   WBC 6.1 11/24/2012   HGB 16.8 11/24/2012   HCT 48.9 11/24/2012    PLT 230 11/24/2012   GLUCOSE 89 11/24/2012   ALKPHOS 92 11/24/2012   ALT 19 11/24/2012   AST 20 11/24/2012   NA 140 11/24/2012   K 3.7 11/24/2012   CL 106 11/24/2012   CREATININE 1.0 11/24/2012   BUN 10.6 11/24/2012   CO2 22 11/24/2012   IMAGING:  *RADIOLOGY REPORT*  Clinical Data: Follow-up brain tumor. oligodendroglioma.  MRI HEAD WITHOUT AND WITH CONTRAST  Technique: Multiplanar, multiecho pulse sequences of the brain and  surrounding structures were obtained according to standard protocol  without and with intravenous contrast  Contrast: 19mL MULTIHANCE GADOBENATE DIMEGLUMINE 529 MG/ML IV SOLN  Comparison: 11/30/2011. Head CT 10/31/2004.  Findings: The brainstem is normal. There are old small vessel  infarctions affecting the cerebellum.  There is no discernible change relating to have a nonenhancing mass  lesion of the medial temporal lobe on the right. Old blood  products are present to within this lesion. There are extensive  intratumoral cystic changes. One would suspect that there had been  previous biopsy, though the approach is not clearly apparent . The  internal characteristics and overall size of the lesion are  unchanged. No evidence of dedifferentiation, extension or  development of a second lesion. Gliotic focus in the right  hemispheric white matter appears the same. 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 No hydrocephalus. No extra-axial fluid  collection. There is mucosal inflammation of the paranasal  sinuses.  IMPRESSION:  Stable MRI examination when compared to May of last year. Right  mesial temporal lobe tumor shows no evidence of growth or  dedifferentiation.  Original Report Authenticated By: Paulina Fusi, M.D.  ASSESSMENT AND PLAN:   1. History of left temporal glioma:  - s/p resection in 2001; s/p adjuvant radiation therapy. He  has been in remission. There has been no residual side effects.  - Clinically today on exam, there was no sign of recurrence. Brain MRI from 08/2012 showed no evidence of recurrence. Per patient, he will have another MRI at the end of the year by Dr Venetia Maxon. 2. Seizure: Continue with Kepra 500mg  PO BID. 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 I advised him not to drink EtOH while taking Kepra.  3. Chewing tobacco: I strongly urged him to stop this since it can increase his risk of oral cancer.  4. Follow-up: I have recommended follow-up in 1 year, but the patient prefers to follow-up PRN. He can be referred back to [place] as needed.       The length of time of the face-to-face encounter was 15 minutes. More than 50% of time was spent counseling and coordination of care.

## 2012-12-05 ENCOUNTER — Encounter (HOSPITAL_COMMUNITY): Payer: Self-pay | Admitting: Emergency Medicine

## 2012-12-05 ENCOUNTER — Telehealth: Payer: Self-pay | Admitting: Nurse Practitioner

## 2012-12-05 ENCOUNTER — Emergency Department (HOSPITAL_COMMUNITY)
Admission: EM | Admit: 2012-12-05 | Discharge: 2012-12-05 | Disposition: A | Discharge status: Home / Self Care | Payer: Worker's Compensation | Attending: Emergency Medicine | Admitting: Emergency Medicine

## 2012-12-05 ENCOUNTER — Emergency Department (HOSPITAL_COMMUNITY): Payer: Worker's Compensation

## 2012-12-05 DIAGNOSIS — G40909 Epilepsy, unspecified, not intractable, without status epilepticus: Secondary | ICD-10-CM | POA: Insufficient documentation

## 2012-12-05 DIAGNOSIS — Z8669 Personal history of other diseases of the nervous system and sense organs: Secondary | ICD-10-CM | POA: Insufficient documentation

## 2012-12-05 DIAGNOSIS — IMO0002 Reserved for concepts with insufficient information to code with codable children: Secondary | ICD-10-CM | POA: Insufficient documentation

## 2012-12-05 DIAGNOSIS — C719 Malignant neoplasm of brain, unspecified: Secondary | ICD-10-CM | POA: Insufficient documentation

## 2012-12-05 DIAGNOSIS — R55 Syncope and collapse: Secondary | ICD-10-CM | POA: Insufficient documentation

## 2012-12-05 DIAGNOSIS — Z8781 Personal history of (healed) traumatic fracture: Secondary | ICD-10-CM | POA: Insufficient documentation

## 2012-12-05 DIAGNOSIS — Z87891 Personal history of nicotine dependence: Secondary | ICD-10-CM | POA: Insufficient documentation

## 2012-12-05 DIAGNOSIS — G9389 Other specified disorders of brain: Secondary | ICD-10-CM

## 2012-12-05 DIAGNOSIS — Y92410 Unspecified street and highway as the place of occurrence of the external cause: Secondary | ICD-10-CM | POA: Insufficient documentation

## 2012-12-05 DIAGNOSIS — Y9389 Activity, other specified: Secondary | ICD-10-CM | POA: Insufficient documentation

## 2012-12-05 DIAGNOSIS — J45909 Unspecified asthma, uncomplicated: Secondary | ICD-10-CM | POA: Insufficient documentation

## 2012-12-05 DIAGNOSIS — Z79899 Other long term (current) drug therapy: Secondary | ICD-10-CM | POA: Insufficient documentation

## 2012-12-05 LAB — BASIC METABOLIC PANEL
BUN: 8 mg/dL (ref 6–23)
CO2: 26 mEq/L (ref 19–32)
Chloride: 101 mEq/L (ref 96–112)
Creatinine, Ser: 0.99 mg/dL (ref 0.50–1.35)
Glucose, Bld: 87 mg/dL (ref 70–99)

## 2012-12-05 LAB — POCT I-STAT TROPONIN I: Troponin i, poc: 0 ng/mL (ref 0.00–0.08)

## 2012-12-05 LAB — CBC
HCT: 48.3 % (ref 39.0–52.0)
MCV: 83.9 fL (ref 78.0–100.0)
RBC: 5.76 MIL/uL (ref 4.22–5.81)
WBC: 6.2 10*3/uL (ref 4.0–10.5)
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
## 2012-12-08 ENCOUNTER — Encounter (HOSPITAL_BASED_OUTPATIENT_CLINIC_OR_DEPARTMENT_OTHER): Payer: Self-pay | Admitting: Emergency Medicine

## 2012-12-08 ENCOUNTER — Emergency Department (HOSPITAL_BASED_OUTPATIENT_CLINIC_OR_DEPARTMENT_OTHER)
Admission: EM | Admit: 2012-12-08 | Discharge: 2012-12-08 | Disposition: A | Discharge status: Home / Self Care | Payer: Worker's Compensation | Attending: Emergency Medicine | Admitting: Emergency Medicine

## 2012-12-08 DIAGNOSIS — J45909 Unspecified asthma, uncomplicated: Secondary | ICD-10-CM | POA: Insufficient documentation

## 2012-12-08 DIAGNOSIS — Z139 Encounter for screening, unspecified: Secondary | ICD-10-CM

## 2012-12-08 DIAGNOSIS — Z85841 Personal history of malignant neoplasm of brain: Secondary | ICD-10-CM | POA: Insufficient documentation

## 2012-12-08 DIAGNOSIS — R55 Syncope and collapse: Secondary | ICD-10-CM | POA: Insufficient documentation

## 2012-12-08 DIAGNOSIS — Z1389 Encounter for screening for other disorder: Secondary | ICD-10-CM | POA: Insufficient documentation

## 2012-12-08 DIAGNOSIS — Z8709 Personal history of other diseases of the respiratory system: Secondary | ICD-10-CM | POA: Insufficient documentation

## 2012-12-08 DIAGNOSIS — Z87891 Personal history of nicotine dependence: Secondary | ICD-10-CM | POA: Insufficient documentation

## 2012-12-08 DIAGNOSIS — G40909 Epilepsy, unspecified, not intractable, without status epilepticus: Secondary | ICD-10-CM | POA: Insufficient documentation

## 2012-12-08 DIAGNOSIS — IMO0002 Reserved for concepts with insufficient information to code with codable children: Secondary | ICD-10-CM | POA: Insufficient documentation

## 2012-12-08 DIAGNOSIS — Z8679 Personal history of other diseases of the circulatory system: Secondary | ICD-10-CM | POA: Insufficient documentation

## 2012-12-08 DIAGNOSIS — Z8781 Personal history of (healed) traumatic fracture: Secondary | ICD-10-CM | POA: Insufficient documentation

## 2012-12-08 DIAGNOSIS — Z79899 Other long term (current) drug therapy: Secondary | ICD-10-CM | POA: Insufficient documentation
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
## 2012-12-08 NOTE — Discharge Instructions (Signed)
No active health concerns.  Please follow up with your neurologist or primary care physician regarding work status
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
## 2012-12-12 NOTE — Telephone Encounter (Signed)
Called patient he is coming for a sooner apt. With Dr.Yan.

## 2012-12-13 ENCOUNTER — Ambulatory Visit (INDEPENDENT_AMBULATORY_CARE_PROVIDER_SITE_OTHER): Payer: BC Managed Care – PPO | Admitting: Neurology

## 2012-12-13 ENCOUNTER — Encounter: Payer: Self-pay | Admitting: Neurology

## 2012-12-13 VITALS — BP 121/73 | HR 73 | Ht 71.5 in | Wt 194.5 lb

## 2012-12-13 DIAGNOSIS — R569 Unspecified convulsions: Secondary | ICD-10-CM

## 2012-12-13 DIAGNOSIS — G40209 Localization-related (focal) (partial) symptomatic epilepsy and epileptic syndromes with complex partial seizures, not intractable, without status epilepticus: Secondary | ICD-10-CM

## 2012-12-13 MED ORDER — LEVETIRACETAM 500 MG PO TABS: 1000.0000 mg | ORAL_TABLET | Freq: Two times a day (BID) | ORAL | Status: DC
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
## 2012-12-14 ENCOUNTER — Telehealth: Payer: Self-pay

## 2012-12-14 MED ORDER — LEVETIRACETAM 500 MG PO TABS: ORAL_TABLET | ORAL | Status: DC

## 2012-12-14 NOTE — Telephone Encounter (Signed)
[facility] called wanting to verify Keppra Rx.  The prescription sent has two different directions in the sig.  I reviewed OV which says: PLAN: Discussed with Dr. Terrace Arabia. Will repeat EEG. Will increase Keppra to 1500mg  daily. I will update Rx and resend.

## 2012-12-15 DIAGNOSIS — Z0289 Encounter for other administrative examinations: Secondary | ICD-10-CM

## 2012-12-21 ENCOUNTER — Other Ambulatory Visit: Payer: Self-pay | Admitting: Neurology

## 2012-12-21 DIAGNOSIS — G40209 Localization-related (focal) (partial) symptomatic epilepsy and epileptic syndromes with complex partial seizures, not intractable, without status epilepticus: Secondary | ICD-10-CM | POA: Insufficient documentation

## 2012-12-21 MED ORDER — LEVETIRACETAM 1000 MG PO TABS: ORAL_TABLET | ORAL | Status: DC

## 2012-12-26 ENCOUNTER — Ambulatory Visit: Payer: Self-pay | Admitting: Nurse Practitioner

## 2013-01-09 ENCOUNTER — Other Ambulatory Visit: Payer: Self-pay | Admitting: Radiation Therapy

## 2013-01-09 DIAGNOSIS — C719 Malignant neoplasm of brain, unspecified: Secondary | ICD-10-CM

## 2013-02-08 DIAGNOSIS — Z0289 Encounter for other administrative examinations: Secondary | ICD-10-CM

## 2013-02-23 ENCOUNTER — Telehealth: Payer: Self-pay

## 2013-02-23 NOTE — Telephone Encounter (Signed)
I called and left VM for patient that disability forms are at front for pick up. Please make sure you complete your parts, review carefully. I will have ongoing forms placed to be completed the first week of each month.

## 2013-03-16 ENCOUNTER — Ambulatory Visit (INDEPENDENT_AMBULATORY_CARE_PROVIDER_SITE_OTHER): Payer: BC Managed Care – PPO | Admitting: Nurse Practitioner

## 2013-03-16 ENCOUNTER — Encounter: Payer: Self-pay | Admitting: Nurse Practitioner

## 2013-03-16 VITALS — BP 123/71 | HR 73 | Ht 71.0 in | Wt 190.0 lb

## 2013-03-16 DIAGNOSIS — R569 Unspecified convulsions: Secondary | ICD-10-CM

## 2013-03-16 MED ORDER — LEVETIRACETAM 500 MG PO TABS: 1000.0000 mg | ORAL_TABLET | Freq: Two times a day (BID) | ORAL | Status: DC
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
## 2013-03-16 NOTE — Patient Instructions (Addendum)
Continue Keppra at 1000mg  BID, will change  to 500mg  tab rx to drug store F/U in 3 months

## 2013-04-02 DIAGNOSIS — Z0289 Encounter for other administrative examinations: Secondary | ICD-10-CM

## 2013-04-20 ENCOUNTER — Telehealth: Payer: Self-pay

## 2013-04-20 NOTE — Telephone Encounter (Signed)
I called patient and left a VM that I am submitting his disability/out of work forms. He is to be out of work and seizure free for 6 months from 12/05/2012. That is now two months from now. Please call and advise [place] if there have been and new seizures. I will call when forms are signed so patient can pick up his copy.

## 2013-04-24 ENCOUNTER — Telehealth: Payer: Self-pay

## 2013-04-24 NOTE — Telephone Encounter (Signed)
I called patient to let him know that his forms have been signed and are up front for him to pick up when he makes a payment. I have also faxed a copy to his employer. Patient states that he has had no further seizures.

## 2013-04-30 DIAGNOSIS — Z0289 Encounter for other administrative examinations: Secondary | ICD-10-CM

## 2013-05-23 ENCOUNTER — Telehealth: Payer: Self-pay | Admitting: Nurse Practitioner

## 2013-05-23 NOTE — Telephone Encounter (Signed)
called patient to reschedule per yan, couldn't reach pt, left message to call back and reschedule.  °

## 2013-05-25 ENCOUNTER — Telehealth: Payer: Self-pay | Admitting: Nurse Practitioner

## 2013-06-01 NOTE — Telephone Encounter (Signed)
sched slot change, CM has no more 11:30 available openings, patient did not want to wait to be seen, resched appt w/ Dr Terrace Arabia, confirmed with patient.

## 2013-06-12 ENCOUNTER — Ambulatory Visit
Admission: RE | Admit: 2013-06-12 | Discharge: 2013-06-12 | Disposition: A | Discharge status: Home / Self Care | Payer: BC Managed Care – PPO | Source: Ambulatory Visit | Attending: Radiation Oncology | Admitting: Radiation Oncology

## 2013-06-12 ENCOUNTER — Inpatient Hospital Stay: Admission: RE | Admit: 2013-06-12 | Payer: Self-pay | Source: Ambulatory Visit

## 2013-06-12 DIAGNOSIS — C719 Malignant neoplasm of brain, unspecified: Secondary | ICD-10-CM

## 2013-06-12 MED ORDER — GADOBENATE DIMEGLUMINE 529 MG/ML IV SOLN: 18.0000 mL | Freq: Once | INTRAVENOUS | Status: AC | PRN

## 2013-06-12 NOTE — Progress Notes (Signed)
Quick Note:  No new findings to present in conference. Neuroradiology agrees, we could forego the imaging review in our conference tomorrow. ______

## 2013-06-13 ENCOUNTER — Ambulatory Visit
Admission: RE | Admit: 2013-06-13 | Discharge: 2013-06-13 | Disposition: A | Discharge status: Home / Self Care | Payer: BC Managed Care – PPO | Source: Ambulatory Visit | Attending: Radiation Oncology | Admitting: Radiation Oncology

## 2013-06-13 ENCOUNTER — Encounter: Payer: Self-pay | Admitting: Radiation Oncology

## 2013-06-13 DIAGNOSIS — C719 Malignant neoplasm of brain, unspecified: Secondary | ICD-10-CM

## 2013-06-13 NOTE — Progress Notes (Signed)
  Radiation Oncology         [phone]) [phone] ________________________________  Name: Corey Chang MRN: [id number]  Date: 06/13/2013  DOB: 09/29/1980  Multidisciplinary Neuro Oncology Clinic Follow-Up Visit Note  CC: [name], MD  [name], MD  Diagnosis:   32 yo s/p biopsy and radiotherapy for oligodendroglioma  Interval Since Last Radiation:  13  years  Narrative:  The patient returns today for routine follow-up with myself and Dr. Venetia Maxon from neurosurgery.  The recent films were presented in our multidisciplinary conference with neuroradiology just prior to the clinic.  He is asymptomatic.                              ALLERGIES:  has No Known Allergies.  Meds: Current Outpatient Prescriptions  Medication Sig Dispense Refill  . fluticasone (FLONASE) 50 MCG/ACT nasal spray Place 1 spray into the nose daily. 2 sprays each nostril every morning      . levETIRAcetam (KEPPRA) 500 MG tablet Take 2 tablets (1,000 mg total) by mouth every 12 (twelve) hours.  120 tablet  11  . montelukast (SINGULAIR) 10 MG tablet Take 10 mg by mouth at bedtime.      . Naproxen Sodium (ALEVE PO) Take 1 tablet by mouth daily as needed (for pain.).      Marland Kitchen PROAIR HFA 108 (90 BASE) MCG/ACT inhaler Inhale 1 puff into the lungs daily as needed for wheezing. As needed       No current facility-administered medications for this encounter.    Physical Findings: The patient is in no acute distress. Patient is alert and oriented.  vitals were not taken for this visit..  No significant changes.  Lab Findings: Lab Results  Component Value Date   WBC 6.2 12/05/2012   HGB 16.9 12/05/2012   HCT 48.3 12/05/2012   MCV 83.9 12/05/2012   PLT 209 12/05/2012    @LASTCHEM @  Radiographic Findings: Mr Laqueta Jean DG Contrast  06/12/2013   CLINICAL DATA:  Followup brain tumor.  Glioma  EXAM: MRI HEAD WITHOUT AND WITH CONTRAST  TECHNIQUE: Multiplanar, multiecho pulse sequences of the brain and surrounding structures  were obtained without and with intravenous contrast.  CONTRAST:  18 mL MultiHance IV  COMPARISON:  MRI 08/25/2012.  CT 12/05/2012  FINDINGS: Mass lesion in the right medial temporal lobe is unchanged. This has cystic and solid components and does not enhance. There is associated chronic hemorrhage, unchanged. This mass involves the hippocampus.  Negative for acute infarct. Chronic ischemia is present in the right cerebellum, right thalamus,, and right frontal white matter.  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 Ill-defined enhancement in the ventral pons unchanged consistent with a small vascular malformation which is stable.  IMPRESSION: Nonenhancing cystic and solid mass right hippocampus is stable. Other findings are unchanged.   Electronically Signed   By: [name] M.D.   On: 06/12/2013 15:04    Impression:  The patient is NED  Plan:  MRI in 1 year then follow-up  _____________________________________  Artist Pais. Kathrynn Running, M.D. and  [name], M.D.

## 2013-06-13 NOTE — Progress Notes (Signed)
Name: Corey Chang MRN: [id number]  Date: 06/13/2013  DOB: 14-Feb-1981  Multidisciplinary Neuro Oncology Clinic Follow-Up Visit Note  CC: [name], MD  [name], MD  Diagnosis:   32 yo s/p biopsy and radiotherapy for oligodendroglioma  Interval Since Last Radiation:  13  years  Narrative:  The patient returns today for routine follow-up with myself and Dr. Venetia Maxon from neurosurgery.  The recent films were presented in our multidisciplinary conference with neuroradiology just prior to the clinic.  He is asymptomatic.   Physical exam reveals fullstrength both upper extremities without drift.  Face symmetric.  PERRL, EOMI.                           ALLERGIES:  has No Known Allergies.  Meds: Current Outpatient Prescriptions  Medication Sig Dispense Refill  . fluticasone (FLONASE) 50 MCG/ACT nasal spray Place 1 spray into the nose daily. 2 sprays each nostril every morning      . levETIRAcetam (KEPPRA) 500 MG tablet Take 2 tablets (1,000 mg total) by mouth every 12 (twelve) hours.  120 tablet  11  . montelukast (SINGULAIR) 10 MG tablet Take 10 mg by mouth at bedtime.      . Naproxen Sodium (ALEVE PO) Take 1 tablet by mouth daily as needed (for pain.).      Marland Kitchen PROAIR HFA 108 (90 BASE) MCG/ACT inhaler Inhale 1 puff into the lungs daily as needed for wheezing. As needed       No current facility-administered medications for this encounter.    Physical Findings: The patient is in no acute distress. Patient is alert and oriented.  vitals were not taken for this visit..  No significant changes.  Lab Findings: Lab Results  Component Value Date   WBC 6.2 12/05/2012   HGB 16.9 12/05/2012   HCT 48.3 12/05/2012   MCV 83.9 12/05/2012   PLT 209 12/05/2012    @LASTCHEM @  Radiographic Findings: Mr Corey Chang Contrast  06/12/2013   CLINICAL DATA:  Followup brain tumor.  Glioma  EXAM: MRI HEAD WITHOUT AND WITH CONTRAST  TECHNIQUE: Multiplanar, multiecho pulse sequences of the brain and  surrounding structures were obtained without and with intravenous contrast.  CONTRAST:  18 mL MultiHance IV  COMPARISON:  MRI 08/25/2012.  CT 12/05/2012  FINDINGS: Mass lesion in the right medial temporal lobe is unchanged. This has cystic and solid components and does not enhance. There is associated chronic hemorrhage, unchanged. This mass involves the hippocampus.  Negative for acute infarct. Chronic ischemia is present in the right cerebellum, right thalamus,, and right frontal white matter.  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 Ill-defined enhancement in the ventral pons unchanged consistent with a small vascular malformation which is stable.  IMPRESSION: Nonenhancing cystic and solid mass right hippocampus is stable. Other findings are unchanged.   Electronically Signed   By: [name] M.D.   On: 06/12/2013 15:04    Impression:  The patient is NED  Plan:  MRI in 1 year then follow-up  _____________________________________  [name], M.D.

## 2013-06-25 ENCOUNTER — Encounter (INDEPENDENT_AMBULATORY_CARE_PROVIDER_SITE_OTHER): Payer: Self-pay

## 2013-06-25 ENCOUNTER — Encounter: Payer: Self-pay | Admitting: Neurology

## 2013-06-25 ENCOUNTER — Ambulatory Visit: Payer: BC Managed Care – PPO | Admitting: Nurse Practitioner

## 2013-06-25 ENCOUNTER — Ambulatory Visit (INDEPENDENT_AMBULATORY_CARE_PROVIDER_SITE_OTHER): Payer: BC Managed Care – PPO | Admitting: Neurology

## 2013-06-25 VITALS — BP 129/85 | HR 73

## 2013-06-25 DIAGNOSIS — C715 Malignant neoplasm of cerebral ventricle: Secondary | ICD-10-CM

## 2013-06-25 DIAGNOSIS — G40209 Localization-related (focal) (partial) symptomatic epilepsy and epileptic syndromes with complex partial seizures, not intractable, without status epilepticus: Secondary | ICD-10-CM
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
## 2013-07-04 ENCOUNTER — Ambulatory Visit (INDEPENDENT_AMBULATORY_CARE_PROVIDER_SITE_OTHER): Payer: BC Managed Care – PPO | Admitting: Neurology

## 2013-07-04 ENCOUNTER — Encounter: Payer: Self-pay | Admitting: Neurology

## 2013-07-04 VITALS — BP 130/80 | HR 68 | Temp 96.8°F | Resp 12 | Ht 71.0 in | Wt 189.4 lb

## 2013-07-04 DIAGNOSIS — C712 Malignant neoplasm of temporal lobe: Secondary | ICD-10-CM

## 2013-07-04 DIAGNOSIS — G40209 Localization-related (focal) (partial) symptomatic epilepsy and epileptic syndromes with complex partial seizures, not intractable, without status epilepticus: Secondary | ICD-10-CM

## 2013-07-04 MED ORDER — LEVETIRACETAM 500 MG PO TABS: 1500.0000 mg | ORAL_TABLET | Freq: Two times a day (BID) | ORAL | Status: DC
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
## 2013-07-05 ENCOUNTER — Ambulatory Visit
Admission: RE | Admit: 2013-07-05 | Discharge: 2013-07-05 | Disposition: A | Discharge status: Home / Self Care | Payer: BC Managed Care – PPO | Source: Ambulatory Visit | Attending: Neurology | Admitting: Neurology

## 2013-07-05 DIAGNOSIS — G40209 Localization-related (focal) (partial) symptomatic epilepsy and epileptic syndromes with complex partial seizures, not intractable, without status epilepticus: Secondary | ICD-10-CM

## 2013-07-23 DIAGNOSIS — Z0289 Encounter for other administrative examinations: Secondary | ICD-10-CM

## 2013-08-01 ENCOUNTER — Other Ambulatory Visit: Payer: Self-pay | Admitting: Hematology and Oncology

## 2013-08-03 ENCOUNTER — Telehealth: Payer: Self-pay | Admitting: Hematology and Oncology

## 2013-08-03 NOTE — Telephone Encounter (Signed)
, °

## 2013-08-06 ENCOUNTER — Telehealth: Payer: Self-pay

## 2013-08-06 NOTE — Telephone Encounter (Signed)
I called and spoke with patient. Okay to fax forms to [phone]. He does not need a copy. We reviewed form. He will be sure to breing in three more forms for April through June.

## 2013-08-08 ENCOUNTER — Ambulatory Visit (HOSPITAL_BASED_OUTPATIENT_CLINIC_OR_DEPARTMENT_OTHER): Payer: BC Managed Care – PPO | Admitting: Hematology and Oncology

## 2013-08-08 ENCOUNTER — Encounter (INDEPENDENT_AMBULATORY_CARE_PROVIDER_SITE_OTHER): Payer: Self-pay

## 2013-08-08 ENCOUNTER — Encounter: Payer: Self-pay | Admitting: Hematology and Oncology

## 2013-08-08 VITALS — BP 129/66 | HR 82 | Temp 97.4°F | Resp 18 | Ht 71.0 in | Wt 191.1 lb

## 2013-08-08 DIAGNOSIS — Z85841 Personal history of malignant neoplasm of brain: Secondary | ICD-10-CM

## 2013-08-08 DIAGNOSIS — C719 Malignant neoplasm of brain, unspecified: Secondary | ICD-10-CM

## 2013-08-08 DIAGNOSIS — F17200 Nicotine dependence, unspecified, uncomplicated: Secondary | ICD-10-CM

## 2013-08-08 DIAGNOSIS — R569 Unspecified convulsions: Secondary | ICD-10-CM

## 2013-08-08 NOTE — Progress Notes (Signed)
[facility] OFFICE PROGRESS NOTE  Patient Care Team: Delia Chimes as PCP - General (Nurse Practitioner)  DIAGNOSIS: Low-grade glioma  SUMMARY OF ONCOLOGIC HISTORY: History of left temporal glioma in 12/1999. He was treated by Dr Elberta Spaniel from [facility]. He underwent sterotactic biopsy of the lesion in 06/1998 with pathology showing low-grade glioma, grade I.In April 2001, brain MRI showed interval development of enhancement in the existing nodules in the right thalamus and right cerebral peduncle with extension into the right medial temporal lobe. He underwent on 01/06/2000 repeat biopsy at [facility] which showed low grade glioma. He underwent adjuvant radiation therapy on RTOG protocol 9802, total 54 Gy in 30 fractions. He was randomized on this protocol to the arm that was supposed to receive chemotherapy; however, he declined.  INTERVAL HISTORY: Corey Chang 33 y.o. male returns for further followup. Over the last 2 months, he had multiple spells which his family member characterize that as partial seizures. There were non-purposeful movements lasting for several seconds to minutes. It were no reported incontinence episodes. No focal neurological deficit. He saw his neurologist recently and was recommended to continue on anti-seizure medications. He denies any headache. No permanent neurological deficit.   I have reviewed the past medical history, past surgical history, social history and family history with the patient and they are unchanged from previous note.  ALLERGIES:  is allergic to carbatrol.  MEDICATIONS:  Current Outpatient Prescriptions  Medication Sig Dispense Refill  . fluticasone (FLONASE) 50 MCG/ACT nasal spray Place 1 spray into the nose daily as needed. 2 sprays each nostril every morning      . levETIRAcetam (KEPPRA) 500 MG tablet Take 3 tablets (1,500 mg total) by mouth every 12 (twelve) hours.  180 tablet  5  . Naproxen Sodium (ALEVE PO) Take 1 tablet by mouth  daily as needed (for pain.).      Marland Kitchen PROAIR HFA 108 (90 BASE) MCG/ACT inhaler Inhale 1 puff into the lungs daily as needed for wheezing. As needed       No current facility-administered medications for this visit.    REVIEW OF SYSTEMS:   Constitutional: Denies fevers, chills or abnormal weight loss Eyes: Denies blurriness of vision Ears, nose, mouth, throat, and face: Denies mucositis or sore throat Respiratory: Denies cough, dyspnea or wheezes Cardiovascular: Denies palpitation, chest discomfort or lower extremity swelling Gastrointestinal:  Denies nausea, heartburn or change in bowel habits Skin: Denies abnormal skin rashes Lymphatics: Denies new lymphadenopathy or easy bruising Neurological:Denies numbness, tingling or new weaknesses Behavioral/Psych: Mood is stable, no new changes  All other systems were reviewed with the patient and are negative.  PHYSICAL EXAMINATION: ECOG PERFORMANCE STATUS: 1 - Symptomatic but completely ambulatory  Filed Vitals:   08/08/13 1352  BP: 129/66  Pulse: 82  Temp: 97.4 F (36.3 C)  Resp: 18   Filed Weights   08/08/13 1352  Weight: 191 lb 1.6 oz (86.682 kg)    GENERAL:alert, no distress and comfortable SKIN: skin color, texture, turgor are normal, no rashes or significant lesions EYES: normal, Conjunctiva are pink and non-injected, sclera clear OROPHARYNX:no exudate, no erythema and lips, buccal mucosa, and tongue normal  NECK: supple, thyroid normal size, non-tender, without nodularity LYMPH:  no palpable lymphadenopathy in the cervical, axillary or inguinal LUNGS: clear to auscultation and percussion with normal breathing effort HEART: regular rate & rhythm and no murmurs and no lower extremity edema ABDOMEN:abdomen soft, non-tender and normal bowel sounds Musculoskeletal:no cyanosis of digits and no clubbing  NEURO: alert & oriented x 3 with fluent speech, no focal motor/sensory deficits  LABORATORY DATA:  I have reviewed the data  as listed    Component Value Date/Time   NA 139 12/05/2012 0949   NA 140 11/24/2012 0825   K 3.6 12/05/2012 0949   K 3.7 11/24/2012 0825   CL 101 12/05/2012 0949   CL 106 11/24/2012 0825   CO2 26 12/05/2012 0949   CO2 22 11/24/2012 0825   GLUCOSE 87 12/05/2012 0949   GLUCOSE 89 11/24/2012 0825   BUN 8 12/05/2012 0949   BUN 10.6 11/24/2012 0825   CREATININE 0.99 12/05/2012 0949   CREATININE 1.0 11/24/2012 0825   CALCIUM 9.7 12/05/2012 0949   CALCIUM 9.4 11/24/2012 0825   PROT 7.5 11/24/2012 0825   PROT 7.3 11/26/2011 1009   ALBUMIN 4.1 11/24/2012 0825   ALBUMIN 4.9 11/26/2011 1009   AST 20 11/24/2012 0825   AST 28 11/26/2011 1009   ALT 19 11/24/2012 0825   ALT 28 11/26/2011 1009   ALKPHOS 92 11/24/2012 0825   ALKPHOS 86 11/26/2011 1009   BILITOT 0.39 11/24/2012 0825   BILITOT 0.6 11/26/2011 1009   GFRNONAA >90 12/05/2012 0949   GFRAA >90 12/05/2012 0949    No results found for this basename: SPEP,  UPEP,   kappa and lambda light chains    Lab Results  Component Value Date   WBC 6.2 12/05/2012   NEUTROABS 2.3 11/24/2012   HGB 16.9 12/05/2012   HCT 48.3 12/05/2012   MCV 83.9 12/05/2012   PLT 209 12/05/2012      Chemistry      Component Value Date/Time   NA 139 12/05/2012 0949   NA 140 11/24/2012 0825   K 3.6 12/05/2012 0949   K 3.7 11/24/2012 0825   CL 101 12/05/2012 0949   CL 106 11/24/2012 0825   CO2 26 12/05/2012 0949   CO2 22 11/24/2012 0825   BUN 8 12/05/2012 0949   BUN 10.6 11/24/2012 0825   CREATININE 0.99 12/05/2012 0949   CREATININE 1.0 11/24/2012 0825      Component Value Date/Time   CALCIUM 9.7 12/05/2012 0949   CALCIUM 9.4 11/24/2012 0825   ALKPHOS 92 11/24/2012 0825   ALKPHOS 86 11/26/2011 1009   AST 20 11/24/2012 0825   AST 28 11/26/2011 1009   ALT 19 11/24/2012 0825   ALT 28 11/26/2011 1009   BILITOT 0.39 11/24/2012 0825   BILITOT 0.6 11/26/2011 1009       RADIOGRAPHIC STUDIES: I reviewed his most recent CT scan of the head as well as MRI scan  I have personally reviewed the radiological images as listed and  agreed with the findings in the report.   ASSESSMENT & PLAN:  #1 history of glioma #2 recurrent seizures I am concerned, even though imaging study did not show significant change in the size of the lesions, with recent onset of recurrent seizures, I'm wondering whether there is slight progression that is currently manifesting as recurrent seizures. I will call pathology department at [facility] and to add additional testing on his prior biopsies to see if he has left over tissues for 1p19q deletion If this is positive, the patient would be a candidate to consider systemic therapy in the future. We discussed about the pros and cons of pursuing additional biopsies. At present time, the patient is comfortable to continue followup with his neurosurgeon with periodic imaging studies He will continue to followup with his neurologist for management of  seizures I have not mae a return appointment for the patient to come back unless he requires systemic therapy in the new future #3 tobacco abuse I spent some time counseling the patient about the importance of stopping this behavior All questions were answered. The patient knows to call the clinic with any problems, questions or concerns. No barriers to learning was detected. I spent 25 minutes counseling the patient face to face. The total time spent in the appointment was 40 minutes and more than 50% was on counseling and review of test results     [facility], [place], MD 08/08/2013 8:06 PM

## 2013-08-10 ENCOUNTER — Telehealth: Payer: Self-pay | Admitting: *Deleted

## 2013-08-10 NOTE — Telephone Encounter (Signed)
Left VM for Corey Chang in Pathology Dept at [facility].  Asked if any tissue left over for 1p19q deletion?  Requested call back to nurse.

## 2013-08-10 NOTE — Telephone Encounter (Signed)
Message copied by Cathlean Cower on Fri Aug 10, 2013 10:15 AM ------      Message from: [facility], [place]: Wed Aug 08, 2013  8:03 PM      Regarding: pathology department at [facility]       He had multiple biopsies done at [facility]      Can you call the pathology department to see if they have left over tissue to order 1p19q deletion? ------

## 2013-08-13 ENCOUNTER — Telehealth: Payer: Self-pay | Admitting: *Deleted

## 2013-08-13 NOTE — Telephone Encounter (Signed)
Request faxed to [facility] to run test noted below

## 2013-08-13 NOTE — Telephone Encounter (Signed)
Message copied by Patton Salles on Mon Aug 13, 2013 12:15 PM ------      Message from: [facility], [place]: Mon Aug 13, 2013 11:28 AM      Regarding: RE: samples       Let's add specific test called the 1p/19q deletion to tissue from 2001      ----- Message -----         From: Patton Salles, RN         Sent: 08/10/2013   4:33 PM           To: Heath Lark, MD      Subject: samples                                                  [facility] pathology called to say they have tissue from 2001 and 1999.       ------

## 2013-08-16 ENCOUNTER — Telehealth: Payer: Self-pay | Admitting: Neurology

## 2013-08-16 NOTE — Telephone Encounter (Signed)
Pt needs a letter for his job stating that Driving is his only limitation. Please call and he will pick it up.

## 2013-08-16 NOTE — Telephone Encounter (Signed)
Corey Chang, I left message for patient, please call him again,   He has seizure. 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

## 2013-08-21 NOTE — Telephone Encounter (Signed)
I spoke to patient and relayed that he cannot drive until seizure free for 6 months, no ladder climbing , or mechanical operation.  He stated his boss needs the letter stating these things because he is trying to find something the patient can do to come off of disability.

## 2013-08-22 ENCOUNTER — Telehealth: Payer: Self-pay | Admitting: Hematology and Oncology

## 2013-08-22 NOTE — Telephone Encounter (Signed)
I left a voicemail today. I called to inform the patient that is no tissue left from prior biopsy to do any additional testing. There is no change on previous recommendation. Patient is advised for further followup with his neurologist and radiation oncologist in the future and I will be happy to see the patient back if he requires chemotherapy.

## 2013-08-22 NOTE — Telephone Encounter (Signed)
Message copied by [facility], [place] on Wed Aug 22, 2013  9:07 AM ------      Message from: Cathlean Cower      Created: Tue Aug 21, 2013 11:32 AM      Regarding: 1p19q deletion       [facility] called and s/w Tammi.  They told her they thought they had some tissue left to do this test, but when they went to pull the block, they actually do not have any and cannot do any additional testing.  ------

## 2013-08-22 NOTE — Telephone Encounter (Signed)
Corey Chang, please see the letter dated 06/25/2013, it includes the elements, please generate the letter according to his needs.

## 2013-08-24 ENCOUNTER — Telehealth: Payer: Self-pay | Admitting: *Deleted

## 2013-08-24 NOTE — Telephone Encounter (Signed)
Spoke to patient and relayed that the doctor had written a letter on Dec 8,2014.  He was not aware.  I told him I will have the doctor sign the letter and then he will have to pay $20.00 to medical records.  They will be in contact and release letter to patient.

## 2013-08-24 NOTE — Telephone Encounter (Signed)
Left message for patient to call back, not sure if he received the letter dated 06-25-14.  Or did he need an additional letter.

## 2013-08-24 NOTE — Telephone Encounter (Signed)
Spoke to patient and relayed about 06-25-14 letter.  He will speak to MR for payment and pick up letter.

## 2013-08-29 ENCOUNTER — Telehealth: Payer: Self-pay | Admitting: *Deleted

## 2013-08-29 NOTE — Telephone Encounter (Signed)
Received call from Liam Rogers at [facility] yesterday.  Their phone [phone].  She states they found tissue specimen on pt and are running the 1p 19q deletion test as ordered by Dr. Alvy Bimler.  Results will take about 3 days and they will fax them to [place] when completed.  Dr. Alvy Bimler aware.

## 2013-09-06 ENCOUNTER — Encounter: Payer: Self-pay | Admitting: *Deleted

## 2013-09-06 NOTE — Progress Notes (Signed)
Received [employer] report from [facility] as requested by Dr. Alvy Bimler.  Given to Dr. Alvy Bimler for review.

## 2013-09-12 DIAGNOSIS — Z0289 Encounter for other administrative examinations: Secondary | ICD-10-CM

## 2013-09-17 ENCOUNTER — Telehealth: Payer: Self-pay | Admitting: Neurology

## 2013-09-17 NOTE — Telephone Encounter (Signed)
Pt returned your call, I advised pt you were with a pt. Please return pt's call.

## 2013-09-17 NOTE — Telephone Encounter (Signed)
Spoke with patient and received information regarding his disability.
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
## 2013-10-24 ENCOUNTER — Encounter: Payer: Self-pay | Admitting: Neurology

## 2013-10-24 ENCOUNTER — Ambulatory Visit (INDEPENDENT_AMBULATORY_CARE_PROVIDER_SITE_OTHER): Payer: BC Managed Care – PPO | Admitting: Neurology

## 2013-10-24 VITALS — BP 118/84 | HR 68 | Wt 189.4 lb

## 2013-10-24 DIAGNOSIS — G40209 Localization-related (focal) (partial) symptomatic epilepsy and epileptic syndromes with complex partial seizures, not intractable, without status epilepticus: Secondary | ICD-10-CM

## 2013-10-24 DIAGNOSIS — C712 Malignant neoplasm of temporal lobe: Secondary | ICD-10-CM

## 2013-10-24 NOTE — Patient Instructions (Signed)
1. Continue Keppra 1500mg  twice a day  Seizure Precautions: 1. If medication has been prescribed for you to prevent seizures, take it exactly as directed.  Do not stop taking the medicine without talking to your doctor first, even if you have not had a seizure in a long time.   2. Avoid activities in which a seizure would cause danger to yourself or to others.  Don't operate dangerous machinery, swim alone, or climb in high or dangerous places, such as on ladders, roofs, or girders.  Do not drive unless your doctor says you may.  3. If you have any warning that you may have a seizure, lay down in a safe place where you can't hurt yourself.    4.  No driving for 6 months from last seizure, as per [facility].   Please refer to the following link on the [place] website for more information: http://www.epilepsyfoundation.org/answerplace/Social/driving/drivingu.cfm   5.  Maintain good sleep hygiene.  6.  Contact your doctor if you have any problems that may be related to the medicine you are taking.  7.  Call 911 and bring the patient back to the ED if:        A.  The seizure lasts longer than 5 minutes.       B.  The patient doesn't awaken shortly after the seizure  C.  The patient has new problems such as difficulty seeing, speaking or moving  D.  The patient was injured during the seizure  E.  The patient has a temperature over 102 F (39C)  F.  The patient vomited and now is having trouble breathing
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
## 2013-10-25 ENCOUNTER — Encounter: Payer: Self-pay | Admitting: Neurology

## 2013-10-30 ENCOUNTER — Encounter: Payer: Self-pay | Admitting: Neurology

## 2013-10-30 ENCOUNTER — Telehealth: Payer: Self-pay | Admitting: Neurology

## 2013-10-30 NOTE — Telephone Encounter (Signed)
Letter done, pls let him know ready for pickup. thanks

## 2013-10-30 NOTE — Telephone Encounter (Signed)
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

## 2013-10-30 NOTE — Telephone Encounter (Signed)
Unable to reach patient will try it again later

## 2013-10-30 NOTE — Telephone Encounter (Signed)
Pt called requesting to speak to a nurse regarding his limitations Dr. Delice Lesch had went over with him.

## 2013-10-30 NOTE — Telephone Encounter (Signed)
Patient notified

## 2013-12-27 ENCOUNTER — Ambulatory Visit (INDEPENDENT_AMBULATORY_CARE_PROVIDER_SITE_OTHER): Payer: BC Managed Care – PPO | Admitting: Neurology

## 2013-12-27 ENCOUNTER — Encounter: Payer: Self-pay | Admitting: Neurology

## 2013-12-27 ENCOUNTER — Encounter (INDEPENDENT_AMBULATORY_CARE_PROVIDER_SITE_OTHER): Payer: Self-pay

## 2013-12-27 VITALS — BP 125/73 | HR 78 | Ht 71.0 in | Wt 178.0 lb

## 2013-12-27 DIAGNOSIS — R569 Unspecified convulsions: Secondary | ICD-10-CM

## 2013-12-27 DIAGNOSIS — C719 Malignant neoplasm of brain, unspecified: Secondary | ICD-10-CM
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
## 2014-01-07 ENCOUNTER — Other Ambulatory Visit: Payer: Self-pay | Admitting: *Deleted

## 2014-01-07 MED ORDER — LEVETIRACETAM 500 MG PO TABS: 1500.0000 mg | ORAL_TABLET | Freq: Two times a day (BID) | ORAL | Status: DC

## 2014-01-07 NOTE — Telephone Encounter (Signed)
Is it ok to refill? 

## 2014-01-08 ENCOUNTER — Encounter: Payer: Self-pay | Admitting: Neurology

## 2014-01-08 ENCOUNTER — Ambulatory Visit (INDEPENDENT_AMBULATORY_CARE_PROVIDER_SITE_OTHER): Payer: BC Managed Care – PPO | Admitting: Neurology

## 2014-01-08 VITALS — BP 116/80 | HR 79 | Ht 72.0 in | Wt 190.0 lb

## 2014-01-08 DIAGNOSIS — G40209 Localization-related (focal) (partial) symptomatic epilepsy and epileptic syndromes with complex partial seizures, not intractable, without status epilepticus: Secondary | ICD-10-CM

## 2014-01-08 DIAGNOSIS — C719 Malignant neoplasm of brain, unspecified: Secondary | ICD-10-CM

## 2014-01-08 MED ORDER — LEVETIRACETAM 500 MG PO TABS: ORAL_TABLET | ORAL | Status: DC
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
## 2014-01-08 NOTE — Patient Instructions (Signed)
1. Continue Keppra 500mg  3 tabs twice a day 2. Continue follow-up with brain imaging as scheduled 3. Call our office for any problems

## 2014-01-17 ENCOUNTER — Telehealth: Payer: Self-pay | Admitting: Neurology

## 2014-01-17 NOTE — Telephone Encounter (Signed)
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

## 2014-01-17 NOTE — Telephone Encounter (Signed)
Called employer and could not get threw because something was wrong with the line. I called the pt and left message asking if the pt could have his employer give our office a call back concerning this matter.

## 2014-01-24 ENCOUNTER — Telehealth: Payer: Self-pay | Admitting: *Deleted

## 2014-01-24 NOTE — Telephone Encounter (Signed)
Patient calling about his form need it turn in to his employer by the end of this week.

## 2014-02-13 ENCOUNTER — Telehealth: Payer: Self-pay | Admitting: *Deleted

## 2014-02-13 DIAGNOSIS — Z0289 Encounter for other administrative examinations: Secondary | ICD-10-CM

## 2014-02-13 NOTE — Telephone Encounter (Signed)
Call patient on 02/13/14 no answer left message that  his form is ready its at the front desk.

## 2014-03-13 ENCOUNTER — Encounter: Payer: Self-pay | Admitting: Gastroenterology

## 2014-07-31 ENCOUNTER — Ambulatory Visit (INDEPENDENT_AMBULATORY_CARE_PROVIDER_SITE_OTHER): Payer: BC Managed Care – PPO | Admitting: Neurology

## 2014-07-31 ENCOUNTER — Encounter: Payer: Self-pay | Admitting: Neurology

## 2014-07-31 VITALS — BP 118/76 | HR 64 | Resp 16 | Ht 71.0 in | Wt 194.0 lb

## 2014-07-31 DIAGNOSIS — C712 Malignant neoplasm of temporal lobe: Secondary | ICD-10-CM

## 2014-07-31 DIAGNOSIS — G40219 Localization-related (focal) (partial) symptomatic epilepsy and epileptic syndromes with complex partial seizures, intractable, without status epilepticus: Secondary | ICD-10-CM

## 2014-07-31 MED ORDER — LEVETIRACETAM 500 MG PO TABS: ORAL_TABLET | ORAL | Status: DC

## 2014-07-31 MED ORDER — LAMOTRIGINE ER 25 MG PO TB24: ORAL_TABLET | ORAL | Status: DC

## 2014-07-31 NOTE — Patient Instructions (Signed)
1. Schedule MRI brain with and without contrast 2. Start Lamotrigine ER 25mg  : Take 1 tablet daily for 2 weeks, then increase to 2 tablets daily for 2 weeks, then increase to 4 tablets daily 3. Continue Keppra 500mg  3 tablets twice a day 4. Follow-up in 6 weeks, call for any problems  Seizure Precautions: 1. If medication has been prescribed for you to prevent seizures, take it exactly as directed.  Do not stop taking the medicine without talking to your doctor first, even if you have not had a seizure in a long time.   2. Avoid activities in which a seizure would cause danger to yourself or to others.  Don't operate dangerous machinery, swim alone, or climb in high or dangerous places, such as on ladders, roofs, or girders.  Do not drive unless your doctor says you may.  3. If you have any warning that you may have a seizure, lay down in a safe place where you can't hurt yourself.    4.  No driving for 6 months from last seizure, as per [facility].   Please refer to the following link on the [facility] website for more information: http://www.epilepsyfoundation.org/answerplace/Social/driving/drivingu.cfm   5.  Maintain good sleep hygiene.  6.  Contact your doctor if you have any problems that may be related to the medicine you are taking.  7.  Call 911 and bring the patient back to the ED if:        A.  The seizure lasts longer than 5 minutes.       B.  The patient doesn't awaken shortly after the seizure  C.  The patient has new problems such as difficulty seeing, speaking or moving  D.  The patient was injured during the seizure  E.  The patient has a temperature over 102 F (39C)  F.  The patient vomited and now is having trouble breathing
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
## 2014-08-01 NOTE — Progress Notes (Signed)
Done

## 2014-08-04 ENCOUNTER — Ambulatory Visit
Admission: RE | Admit: 2014-08-04 | Discharge: 2014-08-04 | Disposition: A | Discharge status: Home / Self Care | Payer: BC Managed Care – PPO | Source: Ambulatory Visit | Attending: Neurology | Admitting: Neurology

## 2014-08-04 MED ORDER — GADOBENATE DIMEGLUMINE 529 MG/ML IV SOLN
15.0000 mL | Freq: Once | INTRAVENOUS | Status: AC | PRN
  Administered 2014-08-04: 15 mL via INTRAVENOUS

## 2014-08-16 ENCOUNTER — Telehealth: Payer: Self-pay | Admitting: Family Medicine

## 2014-08-16 DIAGNOSIS — Z0279 Encounter for issue of other medical certificate: Secondary | ICD-10-CM

## 2014-08-16 NOTE — Telephone Encounter (Signed)
Left msg for patient to return my call. Need to speak with him about disability forms.

## 2014-08-19 ENCOUNTER — Telehealth: Payer: Self-pay | Admitting: Neurology

## 2014-08-19 NOTE — Telephone Encounter (Signed)
Pt called stating that Dianna Black will be picking up the work forms for him today. C/B [phone]

## 2014-08-19 NOTE — Telephone Encounter (Signed)
Noted  

## 2014-09-11 ENCOUNTER — Encounter: Payer: Self-pay | Admitting: Neurology

## 2014-09-11 ENCOUNTER — Ambulatory Visit (INDEPENDENT_AMBULATORY_CARE_PROVIDER_SITE_OTHER): Payer: BC Managed Care – PPO | Admitting: Neurology

## 2014-09-11 VITALS — BP 110/82 | HR 75 | Resp 16 | Ht 71.0 in | Wt 193.0 lb

## 2014-09-11 DIAGNOSIS — C712 Malignant neoplasm of temporal lobe: Secondary | ICD-10-CM

## 2014-09-11 DIAGNOSIS — G40219 Localization-related (focal) (partial) symptomatic epilepsy and epileptic syndromes with complex partial seizures, intractable, without status epilepticus: Secondary | ICD-10-CM

## 2014-09-11 MED ORDER — LAMOTRIGINE ER 100 MG PO TB24: ORAL_TABLET | ORAL | Status: DC

## 2014-09-11 NOTE — Patient Instructions (Signed)
1. Continue with schedule to increase Lamictal XR 25mg  to 4 tablets next week. When you refill the prescription, it will be Lamictal XR 100mg : Take 1 tablet daily 2. Continue Keppra 3. Follow-up in 3 months  Seizure Precautions: 1. If medication has been prescribed for you to prevent seizures, take it exactly as directed.  Do not stop taking the medicine without talking to your doctor first, even if you have not had a seizure in a long time.   2. Avoid activities in which a seizure would cause danger to yourself or to others.  Don't operate dangerous machinery, swim alone, or climb in high or dangerous places, such as on ladders, roofs, or girders.  Do not drive unless your doctor says you may.  3. If you have any warning that you may have a seizure, lay down in a safe place where you can't hurt yourself.    4.  No driving for 6 months from last seizure, as per [facility].   Please refer to the following link on the [place] website for more information: http://www.epilepsyfoundation.org/answerplace/Social/driving/drivingu.cfm   5.  Maintain good sleep hygiene.  6.  Contact your doctor if you have any problems that may be related to the medicine you are taking.  7.  Call 911 and bring the patient back to the ED if:        A.  The seizure lasts longer than 5 minutes.       B.  The patient doesn't awaken shortly after the seizure  C.  The patient has new problems such as difficulty seeing, speaking or moving  D.  The patient was injured during the seizure  E.  The patient has a temperature over 102 F (39C)  F.  The patient vomited and now is having trouble breathing
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
## 2014-10-07 ENCOUNTER — Ambulatory Visit
Admission: RE | Admit: 2014-10-07 | Discharge: 2014-10-07 | Disposition: A | Discharge status: Home / Self Care | Payer: BC Managed Care – PPO | Source: Ambulatory Visit | Attending: Nurse Practitioner | Admitting: Nurse Practitioner

## 2014-10-07 ENCOUNTER — Other Ambulatory Visit: Payer: Self-pay | Admitting: Nurse Practitioner

## 2014-10-07 DIAGNOSIS — R059 Cough, unspecified: Secondary | ICD-10-CM

## 2014-10-07 DIAGNOSIS — R05 Cough: Secondary | ICD-10-CM

## 2014-12-18 ENCOUNTER — Encounter: Payer: Self-pay | Admitting: Neurology

## 2014-12-18 ENCOUNTER — Ambulatory Visit (INDEPENDENT_AMBULATORY_CARE_PROVIDER_SITE_OTHER): Payer: BC Managed Care – PPO | Admitting: Neurology

## 2014-12-18 VITALS — BP 118/84 | HR 72 | Resp 16 | Ht 71.0 in | Wt 190.0 lb

## 2014-12-18 DIAGNOSIS — G40219 Localization-related (focal) (partial) symptomatic epilepsy and epileptic syndromes with complex partial seizures, intractable, without status epilepticus: Secondary | ICD-10-CM | POA: Diagnosis not present

## 2014-12-18 DIAGNOSIS — C712 Malignant neoplasm of temporal lobe: Secondary | ICD-10-CM

## 2014-12-18 MED ORDER — LAMOTRIGINE ER 100 MG PO TB24: ORAL_TABLET | ORAL | Status: DC

## 2014-12-18 MED ORDER — LEVETIRACETAM 500 MG PO TABS: ORAL_TABLET | ORAL | Status: DC

## 2014-12-18 NOTE — Telephone Encounter (Signed)
Done
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
## 2014-12-18 NOTE — Patient Instructions (Signed)
1. Continue Lamictal XR 100mg  daily and Keppra 1500mg  twice a day 2. Bloodwork for Keppra and Lamictal levels 3. We will request recent bloodwork done by your PCP 4. Contact the DMV regarding driving paperwork 5. Follow-up in 4 months, call our office for any changes  Seizure Precautions: 1. If medication has been prescribed for you to prevent seizures, take it exactly as directed.  Do not stop taking the medicine without talking to your doctor first, even if you have not had a seizure in a long time.   2. Avoid activities in which a seizure would cause danger to yourself or to others.  Don't operate dangerous machinery, swim alone, or climb in high or dangerous places, such as on ladders, roofs, or girders.  Do not drive unless your doctor says you may.  3. If you have any warning that you may have a seizure, lay down in a safe place where you can't hurt yourself.    4.  No driving for 6 months from last seizure, as per [facility].   Please refer to the following link on the [facility] website for more information: http://[email or web address].org/answerplace/Social/driving/drivingu.cfm   5.  Maintain good sleep hygiene.  6.  Contact your doctor if you have any problems that may be related to the medicine you are taking.  7.  Call 911 and bring the patient back to the ED if:        A.  The seizure lasts longer than 5 minutes.       B.  The patient doesn't awaken shortly after the seizure  C.  The patient has new problems such as difficulty seeing, speaking or moving  D.  The patient was injured during the seizure  E.  The patient has a temperature over 102 F (39C)  F.  The patient vomited and now is having trouble breathing

## 2015-01-01 ENCOUNTER — Telehealth: Payer: Self-pay | Admitting: Family Medicine

## 2015-01-01 ENCOUNTER — Telehealth: Payer: Self-pay | Admitting: Neurology

## 2015-01-01 DIAGNOSIS — G40219 Localization-related (focal) (partial) symptomatic epilepsy and epileptic syndromes with complex partial seizures, intractable, without status epilepticus: Secondary | ICD-10-CM

## 2015-01-01 NOTE — Telephone Encounter (Signed)
Called patient and notified of lamictal level & the need to repeat. Patient stated that he hasn't missed any doses & that he is taking med everyday. He is requesting to go to [facility] where he can get labs done for free. I will put in an order for level and mail patient the order per his request.

## 2015-01-01 NOTE — Telephone Encounter (Signed)
-----   Message from Cameron Sprang, MD sent at 01/01/2015  3:17 PM EDT ----- Regarding: labs Pls ask patient if he missed any Lamictal doses. His blood level was undetectable (is he taking it?). Would like to repeat Lamictal level. keppra level good. Thanks

## 2015-01-01 NOTE — Telephone Encounter (Signed)
Bloodwork from 12/25/14 reviewed: Keppra level 13.5; Lamictal level none detected (reference 2.0-20; detection limit 1.0).   Repeat Lamictal level.

## 2015-01-14 ENCOUNTER — Telehealth: Payer: Self-pay | Admitting: Family Medicine

## 2015-01-14 NOTE — Telephone Encounter (Signed)
Patient was notified of results & advisement. 

## 2015-01-14 NOTE — Telephone Encounter (Signed)
-----   Message from Cameron Sprang, MD sent at 01/14/2015  9:00 AM EDT ----- Regarding: Lamictal level Pls let him know lamictal is now detected (1.7), continue current dose and all his other medications. Thanks

## 2015-04-21 ENCOUNTER — Ambulatory Visit (INDEPENDENT_AMBULATORY_CARE_PROVIDER_SITE_OTHER): Payer: BC Managed Care – PPO | Admitting: Neurology

## 2015-04-21 ENCOUNTER — Encounter: Payer: Self-pay | Admitting: Neurology

## 2015-04-21 VITALS — BP 118/78 | HR 67 | Resp 16 | Wt 192.0 lb

## 2015-04-21 DIAGNOSIS — G40219 Localization-related (focal) (partial) symptomatic epilepsy and epileptic syndromes with complex partial seizures, intractable, without status epilepticus: Secondary | ICD-10-CM

## 2015-04-21 DIAGNOSIS — C712 Malignant neoplasm of temporal lobe: Secondary | ICD-10-CM

## 2015-04-21 NOTE — Patient Instructions (Addendum)
1. Continue all your medications 2. Call our office once you are ready to have the MRI brain (after January 2017) 3. Recommend contacting the DMV regarding forms for seizures that I will fill out for you 4. Follow-up in 4 months
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
## 2015-12-05 ENCOUNTER — Telehealth: Payer: Self-pay | Admitting: Neurology

## 2015-12-05 NOTE — Telephone Encounter (Signed)
Patient called to get his f/u mri scheduled and also wants to get forms filled out for work. He was last seen 04/2015 & was do to have a repeat MRI in Jan. He didn't have his f/u visit in Feb. Patient needs forms filled out which Dr. Delice Lesch did last Feb 2016. He states he needs new forms filled out by end of June and he can't wait until July/Aug to have forms filled out. Told him that we could go ahead and schedule MRI since that was noted on his last ov that it would need to be repeated. States he hasn't had any seizures, he does drive for work. Please advise about forms and

## 2015-12-05 NOTE — Telephone Encounter (Signed)
[name] 11/10/1980. He called wanting to set up his annual MRI. I did let him know that Dr. Delice Lesch is out of the office until July. His number is [phone]. Thank you

## 2015-12-08 NOTE — Telephone Encounter (Signed)
Message relayed to pt that dr. Tomi Likens would fill out forms. Pt will drop them off at front desk.

## 2015-12-08 NOTE — Telephone Encounter (Signed)
I can fill out the forms.

## 2015-12-10 DIAGNOSIS — Z029 Encounter for administrative examinations, unspecified: Secondary | ICD-10-CM

## 2015-12-10 DIAGNOSIS — R569 Unspecified convulsions: Secondary | ICD-10-CM | POA: Insufficient documentation

## 2015-12-10 NOTE — Telephone Encounter (Signed)
Forms completed. Will leave at front desk to pick up. Dr. Tomi Likens did not remove the portion of Dr. Amparo Bristol form as patient had requested. Dr. Tomi Likens did state, "Patient last seizure was on 12/15. 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 While Dr. 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 Did search the DMV/DOT web site, without much luck. Also an e-mail was sent [facility] customer service for help locating guidelines, still awaiting that reply. Advised pt to reach out to the [facility] or his PCP to find a certified provider that can clear him to return to work.

## 2016-01-06 ENCOUNTER — Other Ambulatory Visit: Payer: Self-pay | Admitting: Neurology

## 2016-01-06 NOTE — Telephone Encounter (Signed)
Rx sent 

## 2016-01-10 ENCOUNTER — Other Ambulatory Visit: Payer: Self-pay | Admitting: Neurology

## 2016-01-12 ENCOUNTER — Other Ambulatory Visit: Payer: Self-pay | Admitting: *Deleted

## 2016-01-12 DIAGNOSIS — G40219 Localization-related (focal) (partial) symptomatic epilepsy and epileptic syndromes with complex partial seizures, intractable, without status epilepticus: Secondary | ICD-10-CM

## 2016-01-12 MED ORDER — LEVETIRACETAM 500 MG PO TABS: ORAL_TABLET | ORAL | Status: DC

## 2016-01-14 ENCOUNTER — Telehealth: Payer: Self-pay | Admitting: Neurology

## 2016-01-14 DIAGNOSIS — C712 Malignant neoplasm of temporal lobe: Secondary | ICD-10-CM

## 2016-01-14 NOTE — Telephone Encounter (Signed)
[id number] patient states that he needs  MRI done before he comes in to see dr Delice Lesch

## 2016-01-14 NOTE — Telephone Encounter (Signed)
Order entered for MR. Looks like patient has called about this before and was supposed to set it up. Referral to [facility] and they will call him to schedule.

## 2016-01-28 ENCOUNTER — Ambulatory Visit
Admission: RE | Admit: 2016-01-28 | Discharge: 2016-01-28 | Disposition: A | Discharge status: Home / Self Care | Payer: BC Managed Care – PPO | Source: Ambulatory Visit | Attending: Neurology | Admitting: Neurology

## 2016-01-28 DIAGNOSIS — C712 Malignant neoplasm of temporal lobe: Secondary | ICD-10-CM

## 2016-01-28 MED ORDER — GADOBENATE DIMEGLUMINE 529 MG/ML IV SOLN
18.0000 mL | Freq: Once | INTRAVENOUS | Status: AC | PRN
  Administered 2016-01-28: 18 mL via INTRAVENOUS

## 2016-01-29 ENCOUNTER — Telehealth: Payer: Self-pay | Admitting: Neurology

## 2016-01-29 NOTE — Telephone Encounter (Signed)
Patient made aware.

## 2016-01-29 NOTE — Telephone Encounter (Signed)
-----   Message from [place], DO sent at 01/29/2016  7:22 AM EDT ----- You can let pt know that MRI was stable when compared to last several years studies.

## 2016-01-29 NOTE — Telephone Encounter (Signed)
Left message on machine for patient to call back.

## 2016-02-16 ENCOUNTER — Other Ambulatory Visit: Payer: Self-pay | Admitting: Neurology

## 2016-02-16 DIAGNOSIS — G40219 Localization-related (focal) (partial) symptomatic epilepsy and epileptic syndromes with complex partial seizures, intractable, without status epilepticus: Secondary | ICD-10-CM

## 2016-02-26 ENCOUNTER — Encounter: Payer: Self-pay | Admitting: Neurology

## 2016-02-26 ENCOUNTER — Ambulatory Visit (INDEPENDENT_AMBULATORY_CARE_PROVIDER_SITE_OTHER): Payer: BC Managed Care – PPO | Admitting: Neurology

## 2016-02-26 VITALS — BP 112/68 | HR 76 | Ht 71.75 in | Wt 196.0 lb

## 2016-02-26 DIAGNOSIS — C712 Malignant neoplasm of temporal lobe: Secondary | ICD-10-CM

## 2016-02-26 DIAGNOSIS — G40219 Localization-related (focal) (partial) symptomatic epilepsy and epileptic syndromes with complex partial seizures, intractable, without status epilepticus: Secondary | ICD-10-CM

## 2016-02-26 MED ORDER — LAMOTRIGINE ER 100 MG PO TB24: 1.0000 | ORAL_TABLET | Freq: Every day | ORAL | 3 refills | Status: DC

## 2016-02-26 MED ORDER — LEVETIRACETAM 500 MG PO TABS: ORAL_TABLET | ORAL | 3 refills | Status: DC

## 2016-02-26 NOTE — Patient Instructions (Signed)
1. Continue all your medications 2. Call our office once ready for MRI brain in May/June 2018 3. Follow-up in 1 year, call for any changes  Seizure Precautions: 1. If medication has been prescribed for you to prevent seizures, take it exactly as directed.  Do not stop taking the medicine without talking to your doctor first, even if you have not had a seizure in a long time.   2. Avoid activities in which a seizure would cause danger to yourself or to others.  Don't operate dangerous machinery, swim alone, or climb in high or dangerous places, such as on ladders, roofs, or girders.  Do not drive unless your doctor says you may.  3. If you have any warning that you may have a seizure, lay down in a safe place where you can't hurt yourself.    4.  No driving for 6 months from last seizure, as per [facility].   Please refer to the following link on the [place] website for more information: http://www.epilepsyfoundation.org/answerplace/Social/driving/drivingu.cfm   5.  Maintain good sleep hygiene. Avoid alcohol.  6.  Contact your doctor if you have any problems that may be related to the medicine you are taking.  7.  Call 911 and bring the patient back to the ED if:        A.  The seizure lasts longer than 5 minutes.       B.  The patient doesn't awaken shortly after the seizure  C.  The patient has new problems such as difficulty seeing, speaking or moving  D.  The patient was injured during the seizure  E.  The patient has a temperature over 102 F (39C)  F.  The patient vomited and now is having trouble breathing
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
## 2016-04-13 ENCOUNTER — Emergency Department (HOSPITAL_COMMUNITY): Payer: Worker's Compensation

## 2016-04-13 ENCOUNTER — Emergency Department (HOSPITAL_COMMUNITY)
Admission: EM | Admit: 2016-04-13 | Discharge: 2016-04-14 | Disposition: A | Discharge status: Home / Self Care | Payer: Worker's Compensation | Attending: Emergency Medicine | Admitting: Emergency Medicine

## 2016-04-13 ENCOUNTER — Encounter (HOSPITAL_COMMUNITY): Payer: Self-pay | Admitting: Emergency Medicine

## 2016-04-13 DIAGNOSIS — R55 Syncope and collapse: Secondary | ICD-10-CM | POA: Diagnosis present

## 2016-04-13 DIAGNOSIS — J45909 Unspecified asthma, uncomplicated: Secondary | ICD-10-CM | POA: Insufficient documentation

## 2016-04-13 DIAGNOSIS — R569 Unspecified convulsions: Secondary | ICD-10-CM

## 2016-04-13 DIAGNOSIS — Z87891 Personal history of nicotine dependence: Secondary | ICD-10-CM | POA: Insufficient documentation

## 2016-04-13 DIAGNOSIS — G40909 Epilepsy, unspecified, not intractable, without status epilepticus: Secondary | ICD-10-CM | POA: Insufficient documentation

## 2016-04-13 DIAGNOSIS — Z85841 Personal history of malignant neoplasm of brain: Secondary | ICD-10-CM | POA: Diagnosis not present

## 2016-04-13 DIAGNOSIS — F909 Attention-deficit hyperactivity disorder, unspecified type: Secondary | ICD-10-CM | POA: Insufficient documentation

## 2016-04-13 LAB — BASIC METABOLIC PANEL
Anion gap: 10 (ref 5–15)
BUN: 9 mg/dL (ref 6–20)
CO2: 24 mmol/L (ref 22–32)
CREATININE: 0.95 mg/dL (ref 0.61–1.24)
Calcium: 9.9 mg/dL (ref 8.9–10.3)
Chloride: 102 mmol/L (ref 101–111)
GFR calc Af Amer: >60 mL/min (ref >60)
GFR calc non Af Amer: >60 mL/min (ref >60)
GLUCOSE: 99 mg/dL (ref 65–99)
POTASSIUM: 3.8 mmol/L (ref 3.5–5.1)
Sodium: 136 mmol/L (ref 135–145)

## 2016-04-13 LAB — CBC
HEMATOCRIT: 47.1 % (ref 39.0–52.0)
Hemoglobin: 16.4 g/dL (ref 13.0–17.0)
MCH: 30 pg (ref 26.0–34.0)
MCHC: 34.8 g/dL (ref 30.0–36.0)
MCV: 86.1 fL (ref 78.0–100.0)
PLATELETS: 226 10*3/uL (ref 150–400)
RBC: 5.47 MIL/uL (ref 4.22–5.81)
RDW: 13 % (ref 11.5–15.5)
WBC: 7.2 10*3/uL (ref 4.0–10.5)

## 2016-04-13 LAB — URINALYSIS, ROUTINE W REFLEX MICROSCOPIC
Bilirubin Urine: NEGATIVE
GLUCOSE, UA: NEGATIVE mg/dL
KETONES UR: NEGATIVE mg/dL
Leukocytes, UA: NEGATIVE
Nitrite: NEGATIVE
Protein, ur: NEGATIVE mg/dL
Specific Gravity, Urine: 1.015 (ref 1.005–1.030)
pH: 5.5 (ref 5.0–8.0)

## 2016-04-13 LAB — URINE MICROSCOPIC-ADD ON
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
## 2016-04-14 ENCOUNTER — Telehealth: Payer: Self-pay

## 2016-04-14 ENCOUNTER — Other Ambulatory Visit: Payer: Self-pay | Admitting: Neurology

## 2016-04-14 DIAGNOSIS — G40219 Localization-related (focal) (partial) symptomatic epilepsy and epileptic syndromes with complex partial seizures, intractable, without status epilepticus: Secondary | ICD-10-CM

## 2016-04-14 MED ORDER — LAMOTRIGINE ER 100 MG PO TB24: ORAL_TABLET | ORAL | 3 refills | Status: DC

## 2016-04-14 NOTE — Discharge Instructions (Addendum)
Call your neurologist in the morning to clarify if you should change your medication dose and to schedule a follow up appointment.

## 2016-04-14 NOTE — Telephone Encounter (Signed)
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 Co-worker told him he went blank, no convulsion, lasted just a few seconds. No recent infections but today he has been feeling sinus stuff, no fever. Sleep has been good. No missed medications. Denies any headaches, dizziness, no focal sx.   He does not think repeat imaging is necessary. Discussed increasing Lamictal XR to 200mg  daily. Continue Keppra 1500mg  BID. Needs a letter stating we discussed what happened, med changes, and no driving until 6 months seizure-free.   Brandy, pls schedule an earlier f/u in 3 months. Thanks

## 2016-04-14 NOTE — ED Provider Notes (Signed)
Care assumed from Dr. Ralene Bathe at end of shift. In brief Corey Chang is an 35 y.o. male with history of oligodendroglioma and seizures currently followed closely by neurology. He presents tot he ED today for evaluation after a likely seizure. Currently on Keppra and Lamictal. At this point disposition is pending CT head.  If CT negative for major acute findings pt can be discharged home with instructions to call his neurologist in the AM for medication dosing adjustment recommendations as well as to schedule close outpatient f/u.   Physical Exam  BP 118/90 (BP Location: Left Arm)   Pulse 72   Temp 98.1 F (36.7 C) (Oral)   Resp 20   Ht 5\' 11"  (1.803 m)   Wt 86.2 kg   SpO2 98%   BMI 26.50 kg/m   Physical Exam  Constitutional: He is oriented to person, place, and time. No distress.  HENT:  Head: Atraumatic.  Right Ear: External ear normal.  Left Ear: External ear normal.  Nose: Nose normal.  Eyes: Conjunctivae are normal. No scleral icterus.  Cardiovascular: Normal rate and regular rhythm.   Pulmonary/Chest: Effort normal. No respiratory distress.  Abdominal: He exhibits no distension.  Neurological: He is alert and oriented to person, place, and time.  Skin: Skin is warm and dry. He is not diaphoretic.  Psychiatric: He has a normal mood and affect. His behavior is normal.  Nursing note and vitals reviewed.    ED Course  Procedures  Results for orders placed or performed during the hospital encounter of [id number]  Basic metabolic panel  Result Value Ref Range   Sodium 136 135 - 145 mmol/L   Potassium 3.8 3.5 - 5.1 mmol/L   Chloride 102 101 - 111 mmol/L   CO2 24 22 - 32 mmol/L   Glucose, Bld 99 65 - 99 mg/dL   BUN 9 6 - 20 mg/dL   Creatinine, Ser 0.95 0.61 - 1.24 mg/dL   Calcium 9.9 8.9 - 10.3 mg/dL   GFR calc non Af Amer >60 >60 mL/min   GFR calc Af Amer >60 >60 mL/min   Anion gap 10 5 - 15  CBC  Result Value Ref Range   WBC 7.2 4.0 - 10.5 K/uL   RBC 5.47 4.22 - 5.81  MIL/uL   Hemoglobin 16.4 13.0 - 17.0 g/dL   HCT 47.1 39.0 - 52.0 %   MCV 86.1 78.0 - 100.0 fL   MCH 30.0 26.0 - 34.0 pg   MCHC 34.8 30.0 - 36.0 g/dL   RDW 13.0 11.5 - 15.5 %   Platelets 226 150 - 400 K/uL  Urinalysis, Routine w reflex microscopic  Result Value Ref Range   Color, Urine YELLOW YELLOW   APPearance CLEAR CLEAR   Specific Gravity, Urine 1.015 1.005 - 1.030   pH 5.5 5.0 - 8.0   Glucose, UA NEGATIVE NEGATIVE mg/dL   Hgb urine dipstick TRACE (A) NEGATIVE   Bilirubin Urine NEGATIVE NEGATIVE   Ketones, ur NEGATIVE NEGATIVE mg/dL   Protein, ur NEGATIVE NEGATIVE mg/dL   Nitrite NEGATIVE NEGATIVE   Leukocytes, UA NEGATIVE NEGATIVE  Urine microscopic-add on  Result Value Ref Range   Squamous Epithelial / LPF 0-5 (A) NONE SEEN   WBC, UA 0-5 0 - 5 WBC/hpf   RBC / HPF 0-5 0 - 5 RBC/hpf   Bacteria, UA FEW (A) NONE SEEN   Ct Head Wo Contrast  Result Date: 04/14/2016 CLINICAL DATA:  35 year old male with syncopal episode. Concern for seizure. History of  brain tumor status post radiation therapy in 2001. EXAM: CT HEAD WITHOUT CONTRAST TECHNIQUE: Contiguous axial images were obtained from the base of the skull through the vertex without intravenous contrast. COMPARISON:  Brain MRI dated 01/28/2016 FINDINGS: Brain: The ventricles and sulci are appropriate in size for the patient's age. An ill-defined area of hypodensity in the right temporal lobe medially corresponds to the lesion seen on the prior MRI. Multiple small right basal ganglia old lacunar infarct versus dilated perivascular spaces. There is no acute intracranial hemorrhage. No mass effect or midline shift noted. No extra-axial fluid collection. Vascular: No hyperdense vessel or unexpected calcification. Skull: Normal. Negative for fracture or focal lesion. Sinuses/Orbits: Mild mucoperiosteal thickening of paranasal sinuses. No air-fluid levels. The mastoid air cells are clear. The calvarium is intact. Other: None IMPRESSION: No  acute intracranial hemorrhage. Ill-defined hypodensity in the right mesial temporal lobe corresponding to the known brain mass. Electronically Signed   By: Anner Crete M.D.   On: 04/14/2016 01:58     MDM CT negative for acute findings. Pt nontoxic appearing, ambulatory with steady gait. Stable for discharge. Instructed to call neuro office in the morning. ER return precautions given.       Anne Ng, PA-C 04/14/16 0255    Quintella Reichert, MD 04/16/16 2251

## 2016-04-14 NOTE — Telephone Encounter (Signed)
Pt called said he had a seizure yesterday. He did go to the Ed. Pt wants to know if he can come in for a OV . He was last seen 02/26/16.

## 2016-04-14 NOTE — Telephone Encounter (Signed)
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 Co-worker told him he went blank, no convulsion, lasted just a few seconds. No recent infections but today he has been feeling sinus stuff, no fever. Sleep has been good. No missed medications. Denies any headaches, dizziness, no focal sx.

## 2016-04-16 ENCOUNTER — Telehealth: Payer: Self-pay | Admitting: Neurology

## 2016-04-16 ENCOUNTER — Encounter: Payer: Self-pay | Admitting: Neurology

## 2016-04-16 NOTE — Telephone Encounter (Signed)
Corey Chang 05/03/1981. He was calling in regarding a Work Note for him to pick up. His # is [id number]. Thank you

## 2016-04-16 NOTE — Telephone Encounter (Signed)
He called back with a Fax # [phone]. Thanks

## 2016-04-19 ENCOUNTER — Encounter: Payer: Self-pay | Admitting: Neurology

## 2016-04-19 NOTE — Telephone Encounter (Signed)
Pt called now wants letter faxed to [phone]. Thank you!

## 2016-04-19 NOTE — Telephone Encounter (Signed)
Letter done, pls fax, thanks

## 2016-04-20 ENCOUNTER — Ambulatory Visit (INDEPENDENT_AMBULATORY_CARE_PROVIDER_SITE_OTHER): Payer: BC Managed Care – PPO | Admitting: Neurology

## 2016-04-20 ENCOUNTER — Encounter: Payer: Self-pay | Admitting: Neurology

## 2016-04-20 VITALS — BP 124/82 | HR 97 | Temp 97.5°F | Ht 71.0 in | Wt 197.3 lb

## 2016-04-20 DIAGNOSIS — G40219 Localization-related (focal) (partial) symptomatic epilepsy and epileptic syndromes with complex partial seizures, intractable, without status epilepticus: Secondary | ICD-10-CM

## 2016-04-20 DIAGNOSIS — C712 Malignant neoplasm of temporal lobe: Secondary | ICD-10-CM | POA: Diagnosis not present

## 2016-04-20 DIAGNOSIS — Z029 Encounter for administrative examinations, unspecified: Secondary | ICD-10-CM
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
## 2016-04-20 NOTE — Patient Instructions (Signed)
1. Continue Keppra 1500mg  twice a day 2. Continue Lamictal XR 100mg  daily 3. No driving until 6 months event-free, call for any change in symptoms 4. Follow-up as scheduled in January 2018  Seizure Precautions: 1. If medication has been prescribed for you to prevent seizures, take it exactly as directed.  Do not stop taking the medicine without talking to your doctor first, even if you have not had a seizure in a long time.   2. Avoid activities in which a seizure would cause danger to yourself or to others.  Don't operate dangerous machinery, swim alone, or climb in high or dangerous places, such as on ladders, roofs, or girders.  Do not drive unless your doctor says you may.  3. If you have any warning that you may have a seizure, lay down in a safe place where you can't hurt yourself.    4.  No driving for 6 months from last seizure, as per [facility].   Please refer to the following link on the [facility] website for more information: http://[email or web address]/answerplace/Social/driving/drivingu.cfm   5.  Maintain good sleep hygiene. Avoid alcohol.  6.  Contact your doctor if you have any problems that may be related to the medicine you are taking.  7.  Call 911 and bring the patient back to the ED if:        A.  The seizure lasts longer than 5 minutes.       B.  The patient doesn't awaken shortly after the seizure  C.  The patient has new problems such as difficulty seeing, speaking or moving  D.  The patient was injured during the seizure  E.  The patient has a temperature over 102 F (39C)  F.  The patient vomited and now is having trouble breathing

## 2016-04-21 ENCOUNTER — Telehealth: Payer: Self-pay | Admitting: Neurology

## 2016-04-21 NOTE — Telephone Encounter (Signed)
Noted  

## 2016-04-21 NOTE — Telephone Encounter (Signed)
Corey Chang 01/21/1981. [employer] called needing to get his return to work papers faxed to  [phone]. Thank you

## 2016-04-30 ENCOUNTER — Encounter: Payer: Self-pay | Admitting: Neurology

## 2016-04-30 ENCOUNTER — Telehealth: Payer: Self-pay

## 2016-04-30 NOTE — Telephone Encounter (Signed)
Notified patient letter and forms for work are complete. He asked not to fax them he wants to pick them up. Also advised patient that [place] [employer] forms will need picked up and brought to our office a little in advance to be filled out.

## 2016-05-20 ENCOUNTER — Telehealth: Payer: Self-pay | Admitting: Neurology

## 2016-05-20 NOTE — Telephone Encounter (Signed)
PT called and wanted a call back, did not say why/Dawn CB# [phone]

## 2016-05-20 NOTE — Telephone Encounter (Signed)
Contacted patient he had questions about DMV driving laws. Notified patient any individual suspected of having physical or mental impairment that may adversely affect his or her ability to safely operate a vehicle is REQUIRED to submit a completed DMV Medical Report from physician.

## 2016-06-01 ENCOUNTER — Ambulatory Visit: Payer: Self-pay | Admitting: Neurology

## 2016-07-27 ENCOUNTER — Encounter: Payer: Self-pay | Admitting: Neurology

## 2016-07-27 ENCOUNTER — Ambulatory Visit (INDEPENDENT_AMBULATORY_CARE_PROVIDER_SITE_OTHER): Payer: BC Managed Care – PPO | Admitting: Neurology

## 2016-07-27 VITALS — BP 122/78 | HR 88 | Ht 71.0 in | Wt 194.1 lb

## 2016-07-27 DIAGNOSIS — C712 Malignant neoplasm of temporal lobe: Secondary | ICD-10-CM

## 2016-07-27 DIAGNOSIS — G40219 Localization-related (focal) (partial) symptomatic epilepsy and epileptic syndromes with complex partial seizures, intractable, without status epilepticus: Secondary | ICD-10-CM | POA: Diagnosis not present

## 2016-07-27 MED ORDER — LAMOTRIGINE ER 100 MG PO TB24: ORAL_TABLET | ORAL | 3 refills | Status: DC

## 2016-07-27 MED ORDER — LEVETIRACETAM 500 MG PO TABS: ORAL_TABLET | ORAL | 3 refills | Status: DC

## 2016-07-27 NOTE — Patient Instructions (Signed)
1. Continue all your medications 2. Recommend obtaining DMV form for seizure 3. Follow-up in 6 months, call for any changes  Seizure Precautions: 1. If medication has been prescribed for you to prevent seizures, take it exactly as directed.  Do not stop taking the medicine without talking to your doctor first, even if you have not had a seizure in a long time.   2. Avoid activities in which a seizure would cause danger to yourself or to others.  Don't operate dangerous machinery, swim alone, or climb in high or dangerous places, such as on ladders, roofs, or girders.  Do not drive unless your doctor says you may.  3. If you have any warning that you may have a seizure, lay down in a safe place where you can't hurt yourself.    4.  No driving for 6 months from last seizure, as per [facility].   Please refer to the following link on the [facility] website for more information: http://www.epilepsyfoundation.org/answerplace/Social/driving/drivingu.cfm   5.  Maintain good sleep hygiene. Avoid alcohol.  6.  Contact your doctor if you have any problems that may be related to the medicine you are taking.  7.  Call 911 and bring the patient back to the ED if:        A.  The seizure lasts longer than 5 minutes.       B.  The patient doesn't awaken shortly after the seizure  C.  The patient has new problems such as difficulty seeing, speaking or moving  D.  The patient was injured during the seizure  E.  The patient has a temperature over 102 F (39C)  F.  The patient vomited and now is having trouble breathing
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
## 2016-08-05 ENCOUNTER — Encounter: Payer: Self-pay | Admitting: Neurology

## 2016-09-13 ENCOUNTER — Other Ambulatory Visit: Payer: Self-pay | Admitting: Unknown Physician Specialty

## 2016-09-13 DIAGNOSIS — H9190 Unspecified hearing loss, unspecified ear: Secondary | ICD-10-CM

## 2016-09-24 ENCOUNTER — Ambulatory Visit: Payer: BC Managed Care – PPO

## 2016-11-11 ENCOUNTER — Encounter (INDEPENDENT_AMBULATORY_CARE_PROVIDER_SITE_OTHER): Payer: Self-pay

## 2016-11-11 ENCOUNTER — Ambulatory Visit (INDEPENDENT_AMBULATORY_CARE_PROVIDER_SITE_OTHER): Payer: BC Managed Care – PPO | Admitting: Internal Medicine

## 2016-11-11 ENCOUNTER — Encounter: Payer: Self-pay | Admitting: Internal Medicine

## 2016-11-11 VITALS — BP 122/84 | HR 78 | Temp 97.9°F | Ht 70.5 in | Wt 192.8 lb

## 2016-11-11 DIAGNOSIS — C719 Malignant neoplasm of brain, unspecified: Secondary | ICD-10-CM

## 2016-11-11 DIAGNOSIS — K219 Gastro-esophageal reflux disease without esophagitis: Secondary | ICD-10-CM | POA: Diagnosis not present

## 2016-11-11 DIAGNOSIS — J301 Allergic rhinitis due to pollen: Secondary | ICD-10-CM

## 2016-11-11 DIAGNOSIS — R569 Unspecified convulsions: Secondary | ICD-10-CM

## 2016-11-11 NOTE — Assessment & Plan Note (Signed)
No known seizures on Keppra and Lamictal Referral placed to [facility] Neurology per pt request

## 2016-11-11 NOTE — Assessment & Plan Note (Signed)
s/p radiation He does not follow with oncology, but will continue to see neurology Referral placed to [facility] Neurology per pt request

## 2016-11-11 NOTE — Assessment & Plan Note (Signed)
Continue Nexium daily and Tums as needed

## 2016-11-11 NOTE — Progress Notes (Signed)
HPI  Pt presents to the clinic today to establish care and for management of the conditions listed below. He is transferring care from Delia Chimes, NP.  Seasonal Allergies: Worse during the spring. He takes Claritin daily with good relief.  Oligodendroglioma: s/p radiation. The mass is still there, but it is not currently growing. He does not follow with oncology. He does follow with Dr. Delice Lesch but is requesting a referral to a different neurologist at this time.  Seizures: Secondary to Oligodendroglioma. He is taking Keppra and Lamictal as prescribed. He follows with Dr. Delice Lesch.  GERD: He is not sure what triggers this. He does get some breakthrough symptoms on Nexium. He takes Tums as needed with good relief.  Flu: never Tetanus: 09/2011 Dentist: biannually  Past Medical History:  Diagnosis Date  . Allergic rhinitis 04/22/97  . Aneurysm ([place])    history of that turned out to be oligodendroglioma  . Ankle fracture, right 07/2007  . Asthma 04/22/97  . Oligodendroglioma ([place]) 2001   Left temporal; bx in 12/1999.  He had external beam radiation in August 2001 with 54 Gy in 30 fractions on RTOG 9802 protocol.   He declined chemotherapy.   . Seizure ([place])    from history of oligodendroglioma    Current Outpatient Prescriptions  Medication Sig Dispense Refill  . Esomeprazole Magnesium (NEXIUM PO) Take by mouth.    . LamoTRIgine 100 MG TB24 Take 1 tablet daily 90 each 3  . levETIRAcetam (KEPPRA) 500 MG tablet Take 3 tablets twice a day 540 tablet 3  . loratadine (CLARITIN) 10 MG tablet Take 10 mg by mouth daily.    . Naproxen Sodium (ALEVE PO) Take 1 tablet by mouth 2 (two) times daily.      No current facility-administered medications for this visit.     Allergies  Allergen Reactions  . Carbatrol [Carbamazepine]     Not sure but may have caused a rash    Family History  Problem Relation Age of Onset  . Alzheimer's disease Mother   . Diabetes Father   . Heart disease Maternal  Grandfather     MI  . Parkinsonism Paternal Grandmother   . Alzheimer's disease Paternal Grandmother   . Depression Neg Hx   . Drug abuse Neg Hx   . Alcohol abuse Neg Hx   . Cancer Neg Hx     no colon, prostate, breast, uterine,ovarian    Social History   Social History  . Marital status: Married    Spouse name: [name]  . Number of children: 1  . Years of education: N/A   Occupational History  . 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  [employer]   Social History Main Topics  . Smoking status: Former Smoker    Types: Cigarettes  . Smokeless tobacco: Current User    Types: Snuff     Comment: Quit 2006  . Alcohol use 1.2 oz/week    2 Cans of beer per week     Comment: 2 beers per week  . Drug use: No  . Sexual activity: Not on file   Other Topics Concern  . Not on file   Social History Narrative   Attends [employer]. [employer]   Patient gets regular exercise.      Patient lives at home with wife [name]). Right handed.    ROS:  Constitutional: Denies fever, malaise, fatigue, headache or abrupt weight changes.  HEENT: Pt reports intermittent nasal congestion. Denies eye pain, eye redness, ear pain, ringing in the ears, wax buildup, runny nose, bloody nose, or sore throat. Respiratory: Denies difficulty breathing, shortness of breath, cough or sputum production.   Cardiovascular: Denies chest pain, chest tightness, palpitations or swelling in the hands or feet.  Gastrointestinal: Pt reports intermittent reflux. Denies abdominal pain, bloating, constipation, diarrhea or blood in the stool.  GU: Denies frequency, urgency, pain with urination, blood in urine, odor or discharge. Musculoskeletal: Denies decrease in range of motion, difficulty with gait, muscle pain or joint pain and swelling.  Skin: Denies redness, rashes, lesions or ulcercations.  Neurological: Denies dizziness, difficulty with memory, difficulty with speech or  problems with balance and coordination.  Psych: Denies anxiety, depression, SI/HI.  No other specific complaints in a complete review of systems (except as listed in HPI above).  PE:  BP 122/84   Pulse 78   Temp 97.9 F (36.6 C) (Oral)   Ht 5' 10.5" (1.791 m)   Wt 192 lb 12 oz (87.4 kg)   SpO2 98%   BMI 27.27 kg/m  Wt Readings from Last 3 Encounters:  11/11/16 192 lb 12 oz (87.4 kg)  07/27/16 194 lb 1 oz (88 kg)  04/20/16 197 lb 5 oz (89.5 kg)    General: Appears her stated age, well developed, well nourished in NAD. HEENT: Head: normal shape and size; Ears: Tm's gray and intact, normal light reflex; Throat/Mouth: Teeth present, mucosa pink and moist, no lesions or ulcerations noted.  Cardiovascular: Normal rate and rhythm. S1,S2 noted.  No murmur, rubs or gallops noted. Pulmonary/Chest: Normal effort and positive vesicular breath sounds. No respiratory distress. No wheezes, rales or ronchi noted.  Abdomen: Soft and nontender. Normal bowel sounds.  Neurological: Alert and oriented.  Psychiatric: Mood and affect normal. Behavior is normal. Judgment and thought content normal.    BMET    Component Value Date/Time   NA 136 04/13/2016 1919   NA 140 11/24/2012 0825   K 3.8 04/13/2016 1919   K 3.7 11/24/2012 0825   CL 102 04/13/2016 1919   CL 106 11/24/2012 0825   CO2 24 04/13/2016 1919   CO2 22 11/24/2012 0825   GLUCOSE 99 04/13/2016 1919   GLUCOSE 89 11/24/2012 0825   BUN 9 04/13/2016 1919   BUN 10.6 11/24/2012 0825   CREATININE 0.95 04/13/2016 1919   CREATININE 1.0 11/24/2012 0825   CALCIUM 9.9 04/13/2016 1919   CALCIUM 9.4 11/24/2012 0825   GFRNONAA >60 04/13/2016 1919   GFRAA >60 04/13/2016 1919    Lipid Panel  No results found for: CHOL, TRIG, HDL, CHOLHDL, VLDL, LDLCALC  CBC    Component Value Date/Time   WBC 7.2 04/13/2016 1919   RBC 5.47 04/13/2016 1919   HGB 16.4 04/13/2016 1919   HGB 16.8 11/24/2012 0825   HCT 47.1 04/13/2016 1919   HCT 48.9  11/24/2012 0825   PLT 226 04/13/2016 1919   PLT 230 11/24/2012 0825   MCV 86.1 04/13/2016 1919   MCV 83.6 11/24/2012 0825   MCH 30.0 04/13/2016 1919   MCHC 34.8 04/13/2016 1919   RDW 13.0 04/13/2016 1919   RDW 13.8 11/24/2012 0825   LYMPHSABS 2.7 11/24/2012 0825   MONOABS 0.7 11/24/2012 0825   EOSABS 0.3 11/24/2012 0825   BASOSABS 0.1 11/24/2012 0825    Hgb A1C No results found for: HGBA1C   Assessment and Plan:

## 2016-11-11 NOTE — Patient Instructions (Signed)
Food Choices for Gastroesophageal Reflux Disease, Adult When you have gastroesophageal reflux disease (GERD), the foods you eat and your eating habits are very important. Choosing the right foods can help ease your discomfort. What guidelines do I need to follow?  Choose fruits, vegetables, whole grains, and low-fat dairy products.  Choose low-fat meat, fish, and poultry.  Limit fats such as oils, salad dressings, butter, nuts, and avocado.  Keep a food diary. This helps you identify foods that cause symptoms.  Avoid foods that cause symptoms. These may be different for everyone.  Eat small meals often instead of 3 large meals a day.  Eat your meals slowly, in a place where you are relaxed.  Limit fried foods.  Cook foods using methods other than frying.  Avoid drinking alcohol.  Avoid drinking large amounts of liquids with your meals.  Avoid bending over or lying down until 2-3 hours after eating. What foods are not recommended? These are some foods and drinks that may make your symptoms worse: Vegetables  Tomatoes. Tomato juice. Tomato and spaghetti sauce. Chili peppers. Onion and garlic. Horseradish. Fruits  Oranges, grapefruit, and lemon (fruit and juice). Meats  High-fat meats, fish, and poultry. This includes hot dogs, ribs, ham, sausage, salami, and bacon. Dairy  Whole milk and chocolate milk. Sour cream. Cream. Butter. Ice cream. Cream cheese. Drinks  Coffee and tea. Bubbly (carbonated) drinks or energy drinks. Condiments  Hot sauce. Barbecue sauce. Sweets/Desserts  Chocolate and cocoa. Donuts. Peppermint and spearmint. Fats and Oils  High-fat foods. This includes French fries and potato chips. Other  Vinegar. Strong spices. This includes black pepper, white pepper, red pepper, cayenne, curry powder, cloves, ginger, and chili powder. The items listed above may not be a complete list of foods and drinks to avoid. Contact your dietitian for more information.    This information is not intended to replace advice given to you by your health care provider. Make sure you discuss any questions you have with your health care provider. Document Released: 01/04/2012 Document Revised: 12/11/2015 Document Reviewed: 05/09/2013 Elsevier Interactive Patient Education  2017 Elsevier Inc.  

## 2016-11-11 NOTE — Assessment & Plan Note (Addendum)
Continue Claritin daily.

## 2016-11-22 ENCOUNTER — Encounter: Payer: Self-pay | Admitting: Neurology

## 2016-11-22 ENCOUNTER — Ambulatory Visit (INDEPENDENT_AMBULATORY_CARE_PROVIDER_SITE_OTHER): Payer: BC Managed Care – PPO | Admitting: Neurology

## 2016-11-22 VITALS — BP 133/96 | HR 75 | Ht 70.5 in | Wt 195.0 lb

## 2016-11-22 DIAGNOSIS — G40219 Localization-related (focal) (partial) symptomatic epilepsy and epileptic syndromes with complex partial seizures, intractable, without status epilepticus: Secondary | ICD-10-CM

## 2016-11-22 DIAGNOSIS — C712 Malignant neoplasm of temporal lobe: Secondary | ICD-10-CM

## 2016-11-22 NOTE — Patient Instructions (Signed)
[email or web address]  [facility]  [id number] Neurology Clinic  [phone]
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
## 2016-11-23 ENCOUNTER — Encounter: Payer: Self-pay | Admitting: Neurology

## 2017-01-25 ENCOUNTER — Ambulatory Visit (INDEPENDENT_AMBULATORY_CARE_PROVIDER_SITE_OTHER): Payer: BC Managed Care – PPO | Admitting: Neurology

## 2017-01-25 ENCOUNTER — Encounter: Payer: Self-pay | Admitting: Neurology

## 2017-01-25 VITALS — BP 134/82 | HR 62 | Ht 71.0 in | Wt 193.0 lb

## 2017-01-25 DIAGNOSIS — G40219 Localization-related (focal) (partial) symptomatic epilepsy and epileptic syndromes with complex partial seizures, intractable, without status epilepticus: Secondary | ICD-10-CM | POA: Diagnosis not present

## 2017-01-25 DIAGNOSIS — C712 Malignant neoplasm of temporal lobe: Secondary | ICD-10-CM

## 2017-01-25 MED ORDER — LEVETIRACETAM 500 MG PO TABS: ORAL_TABLET | ORAL | 3 refills | Status: DC

## 2017-01-25 MED ORDER — LAMOTRIGINE ER 100 MG PO TB24: ORAL_TABLET | ORAL | 3 refills | Status: DC

## 2017-01-25 NOTE — Patient Instructions (Signed)
1. Continue all your medications 2. Follow-up in 6 months, call for any changes  Seizure precautions: 1. If medication has been prescribed for you to prevent seizures, take it exactly as directed.  Do not stop taking the medicine without talking to your doctor first, even if you have not had a seizure in a long time.   2. Avoid activities in which a seizure would cause danger to yourself or to others.  Don't operate dangerous machinery, swim alone, or climb in high or dangerous places, such as on ladders, roofs, or girders.  Do not drive unless your doctor says you may.  3. If you have any warning that you may have a seizure, lay down in a safe place where you can't hurt yourself.    4.  No driving for 6 months from last seizure, as per [facility].   Please refer to the following link on the [place] website for more information: http://[email or web address].org/[email or web address]/driving/drivingu.cfm   5.  Maintain good sleep hygiene. Avoid alcohol.  6.  Contact your doctor if you have any problems that may be related to the medicine you are taking.  7.  Call 911 and bring the patient back to the ED if:        A.  The seizure lasts longer than 5 minutes.       B.  The patient doesn't awaken shortly after the seizure  C.  The patient has new problems such as difficulty seeing, speaking or moving  D.  The patient was injured during the seizure  E.  The patient has a temperature over 102 F (39C)  F.  The patient vomited and now is having trouble breathing
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
## 2017-03-22 DIAGNOSIS — Z85841 Personal history of malignant neoplasm of brain: Secondary | ICD-10-CM | POA: Insufficient documentation

## 2017-03-22 DIAGNOSIS — Z923 Personal history of irradiation: Secondary | ICD-10-CM | POA: Insufficient documentation

## 2017-07-22 ENCOUNTER — Ambulatory Visit (INDEPENDENT_AMBULATORY_CARE_PROVIDER_SITE_OTHER): Payer: BC Managed Care – PPO | Admitting: Neurology

## 2017-07-22 ENCOUNTER — Encounter: Payer: Self-pay | Admitting: Neurology

## 2017-07-22 VITALS — BP 112/78 | HR 81 | Ht 71.0 in | Wt 203.0 lb

## 2017-07-22 DIAGNOSIS — G40219 Localization-related (focal) (partial) symptomatic epilepsy and epileptic syndromes with complex partial seizures, intractable, without status epilepticus: Secondary | ICD-10-CM

## 2017-07-22 DIAGNOSIS — C712 Malignant neoplasm of temporal lobe: Secondary | ICD-10-CM

## 2017-07-22 NOTE — Patient Instructions (Signed)
Good seeing you! Schedule the tests with Dr. Lorain Childes office as discussed, let me know if I can help with them. Continue all your medications. Follow-up in 6 months, call for any changes.  Seizure Precautions: 1. If medication has been prescribed for you to prevent seizures, take it exactly as directed.  Do not stop taking the medicine without talking to your doctor first, even if you have not had a seizure in a long time.   2. Avoid activities in which a seizure would cause danger to yourself or to others.  Don't operate dangerous machinery, swim alone, or climb in high or dangerous places, such as on ladders, roofs, or girders.  Do not drive unless your doctor says you may.  3. If you have any warning that you may have a seizure, lay down in a safe place where you can't hurt yourself.    4.  No driving for 6 months from last seizure, as per [facility].   Please refer to the following link on the [place] website for more information: http://www.epilepsyfoundation.org/answerplace/Social/driving/drivingu.cfm   5.  Maintain good sleep hygiene. Avoid alcohol.  6.  Contact your doctor if you have any problems that may be related to the medicine you are taking.  7.  Call 911 and bring the patient back to the ED if:        A.  The seizure lasts longer than 5 minutes.       B.  The patient doesn't awaken shortly after the seizure  C.  The patient has new problems such as difficulty seeing, speaking or moving  D.  The patient was injured during the seizure  E.  The patient has a temperature over 102 F (39C)  F.  The patient vomited and now is having trouble breathing
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
## 2017-07-27 ENCOUNTER — Encounter: Payer: Self-pay | Admitting: Neurology

## 2017-11-17 ENCOUNTER — Ambulatory Visit: Payer: Self-pay

## 2017-11-17 ENCOUNTER — Encounter: Payer: Self-pay | Admitting: Family Medicine

## 2017-11-17 ENCOUNTER — Ambulatory Visit: Payer: BC Managed Care – PPO | Admitting: Family Medicine

## 2017-11-17 ENCOUNTER — Encounter: Payer: Self-pay | Admitting: Internal Medicine

## 2017-11-17 DIAGNOSIS — R197 Diarrhea, unspecified: Secondary | ICD-10-CM

## 2017-11-17 LAB — CBC WITH DIFFERENTIAL/PLATELET
BASOS ABS: 0.1 10*3/uL (ref 0.0–0.1)
Basophils Relative: 1.2 % (ref 0.0–3.0)
EOS ABS: 0.1 10*3/uL (ref 0.0–0.7)
Eosinophils Relative: 1.8 % (ref 0.0–5.0)
HCT: 49.1 % (ref 39.0–52.0)
HEMOGLOBIN: 16.9 g/dL (ref 13.0–17.0)
Lymphocytes Relative: 22.9 % (ref 12.0–46.0)
Lymphs Abs: 1.3 10*3/uL (ref 0.7–4.0)
MCHC: 34.3 g/dL (ref 30.0–36.0)
MCV: 86.5 fl (ref 78.0–100.0)
MONO ABS: 0.9 10*3/uL (ref 0.1–1.0)
Monocytes Relative: 15.8 % — ABNORMAL HIGH (ref 3.0–12.0)
Neutro Abs: 3.2 10*3/uL (ref 1.4–7.7)
Neutrophils Relative %: 58.3 % (ref 43.0–77.0)
Platelets: 199 10*3/uL (ref 150.0–400.0)
RBC: 5.68 Mil/uL (ref 4.22–5.81)
RDW: 14 % (ref 11.5–15.5)
WBC: 5.5 10*3/uL (ref 4.0–10.5)

## 2017-11-17 LAB — COMPREHENSIVE METABOLIC PANEL
ALBUMIN: 4.5 g/dL (ref 3.5–5.2)
ALK PHOS: 89 U/L (ref 39–117)
ALT: 31 U/L (ref 0–53)
AST: 29 U/L (ref 0–37)
BUN: 10 mg/dL (ref 6–23)
CHLORIDE: 100 meq/L (ref 96–112)
CO2: 26 mEq/L (ref 19–32)
CREATININE: 0.96 mg/dL (ref 0.40–1.50)
Calcium: 9.5 mg/dL (ref 8.4–10.5)
GFR: 94.15 mL/min (ref >60.00)
Glucose, Bld: 81 mg/dL (ref 70–99)
Potassium: 3.9 mEq/L (ref 3.5–5.1)
SODIUM: 135 meq/L (ref 135–145)
TOTAL PROTEIN: 7.4 g/dL (ref 6.0–8.3)
Total Bilirubin: 0.5 mg/dL (ref 0.2–1.2)

## 2017-11-17 NOTE — Progress Notes (Signed)
Subjective:    Patient ID: Corey Chang, male    DOB: 1980-08-30, 37 y.o.   MRN: [id number]  HPI  Here for diarrhea with blood in stool   Diarrhea since Monday afternoon  First bm has blood in it in ams - then just loose and frequent  In ams "slimy blob of blood" like a clot  Drinking a lot of water to stay hydrated  Eating fiber  No black stool   No hx of hemorrhoids   Today- 3 bm so far  More frequent earlier in the week (pretty bad on Tuesday-every 30 minutes)   Some nausea -just one time  No vomiting  No fever  No body aches or chills   No sick contacts  Last ate out - Sunday at [employer]  Ate his regular sausage biscuit later    Lot of gassy type pain - low and in back  Never had a colonoscopy    Wt Readings from Last 3 Encounters:  11/17/17 195 lb 12 oz (88.8 kg)  07/22/17 203 lb (92.1 kg)  01/25/17 193 lb (87.5 kg)     He takes nexium for GERD- very well controlled   Naproxen is on medicine list -takes it twice daily   No bowel disease in the family   Patient Active Problem List   Diagnosis Date Noted  . Bloody diarrhea 11/17/2017  . GERD (gastroesophageal reflux disease) 11/11/2016  . Oligodendroglioma of temporal lobe ([place]) 12/18/2014  . 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 Seasonal allergies 07/08/2008  . SMOKELESS TOBACCO ABUSE 08/02/2007   Past Medical History:  Diagnosis Date  . Allergic rhinitis 04/22/97  . Ankle fracture, right 07/2007  . Oligodendroglioma ([place]) 2001   Left temporal; bx in 12/1999.  He had external beam radiation in August 2001 with 54 Gy in 30 fractions on RTOG 9802 protocol.   He declined chemotherapy.   . Seizure ([place])    from history of oligodendroglioma   Past Surgical History:  Procedure Laterality Date  . ANKLE SURGERY     right, x 3  . brain biopsy  1997, 2001   Social History   Tobacco Use  . Smoking status: Former Smoker    Types: Cigarettes  . Smokeless tobacco: Current User    Types: Snuff  .  Tobacco comment: Quit 2006  Substance Use Topics  . Alcohol use: Yes    Alcohol/week: 1.2 oz    Types: 2 Cans of beer per week    Comment: 2 beers per week  . Drug use: No   Family History  Problem Relation Age of Onset  . Alzheimer's disease Mother   . Diabetes Father   . Heart disease Maternal Grandfather        MI  . Parkinsonism Paternal Grandmother   . Alzheimer's disease Paternal Grandmother   . Depression Neg Hx   . Drug abuse Neg Hx   . Alcohol abuse Neg Hx   . Cancer Neg Hx        no colon, prostate, breast, uterine,ovarian   Allergies  Allergen Reactions  . Carbatrol [Carbamazepine]     Not sure but may have caused a rash   Current Outpatient Medications on File Prior to Visit  Medication Sig Dispense Refill  . Esomeprazole Magnesium (NEXIUM PO) Take by mouth.    . LamoTRIgine 100 MG TB24 Take 1 tablet daily 90 each 3  . levETIRAcetam (KEPPRA) 500 MG tablet Take 3 tablets twice a day 540  tablet 3  . loratadine (CLARITIN) 10 MG tablet Take 10 mg by mouth daily.    . Naproxen Sodium (ALEVE PO) Take 1 tablet by mouth 2 (two) times daily.      No current facility-administered medications on file prior to visit.     Review of Systems  Constitutional: Negative for activity change, appetite change, fatigue, fever and unexpected weight change.  HENT: Negative for congestion, rhinorrhea, sore throat and trouble swallowing.   Eyes: Negative for pain, redness, itching and visual disturbance.  Respiratory: Negative for cough, chest tightness, shortness of breath and wheezing.   Cardiovascular: Negative for chest pain and palpitations.  Gastrointestinal: Positive for abdominal pain and blood in stool. Negative for abdominal distention, constipation, diarrhea, nausea, rectal pain and vomiting.  Endocrine: Negative for cold intolerance, heat intolerance, polydipsia and polyuria.  Genitourinary: Negative for difficulty urinating, dysuria, frequency and urgency.    Musculoskeletal: Negative for arthralgias, joint swelling and myalgias.  Skin: Negative for pallor and rash.  Neurological: Negative for dizziness, tremors, weakness, numbness and headaches.  Hematological: Negative for adenopathy. Does not bruise/bleed easily.  Psychiatric/Behavioral: Negative for decreased concentration and dysphoric mood. The patient is not nervous/anxious.        Objective:   Physical Exam  Constitutional: He appears well-developed and well-nourished. No distress.  Well appearing   HENT:  Head: Normocephalic and atraumatic.  Mouth/Throat: Oropharynx is clear and moist.  Eyes: Pupils are equal, round, and reactive to light. Conjunctivae and EOM are normal. No scleral icterus.  Neck: Normal range of motion. Neck supple.  Cardiovascular: Normal rate, regular rhythm and normal heart sounds.  Pulmonary/Chest: Effort normal and breath sounds normal. No respiratory distress. He has no wheezes. He has no rales.  Abdominal: Soft. Bowel sounds are normal. He exhibits no distension, no pulsatile liver, no pulsatile midline mass and no mass. There is no hepatosplenomegaly. There is generalized tenderness. There is no rebound, no guarding, no tenderness at McBurney's point and negative Murphy's sign.  Very mild abd tenderness in all areas w/o rebound or guarding Worse at LLQ    Genitourinary: Rectum normal.  Genitourinary Comments: Stool is trace heme pos  Lymphadenopathy:    He has no cervical adenopathy.  Neurological: He is alert.  Skin: Skin is warm and dry. Capillary refill takes less than 2 seconds. No erythema. No pallor.  No jaundice   Psychiatric: He has a normal mood and affect.          Assessment & Plan:   Problem List Items Addressed This Visit      Digestive   Bloody diarrhea    Improving from last week (blood with first am bm only)  Suspect infectious etiology Some abd pain- improved  Well hydrated  Labs- cbc/cmet  Stool cx ordered  Watch for  worse diarrhea/abd pain or bloating/fever or more blood  Pend results for plan  Adv not to use anti diarrhea medicines        Relevant Orders   CBC with Differential/Platelet   Comprehensive metabolic panel   Stool culture

## 2017-11-17 NOTE — Assessment & Plan Note (Signed)
Improving from last week (blood with first am bm only)  Suspect infectious etiology Some abd pain- improved  Well hydrated  Labs- cbc/cmet  Stool cx ordered  Watch for worse diarrhea/abd pain or bloating/fever or more blood  Pend results for plan  Adv not to use anti diarrhea medicines

## 2017-11-17 NOTE — Telephone Encounter (Signed)
Pt. Reports 3 days of diarrhea and has noticed dark red blood in stool - "usually in the morning - it is slimy." Has abdominal pain associated with this. No fever. Appointment made for today.  Reason for Disposition . [1] SEVERE diarrhea (e.g., 7 or more times / day more than normal) AND [2] present > 24 hours (1 day)  Answer Assessment - Initial Assessment Questions 1. DIARRHEA SEVERITY: "How bad is the diarrhea?" "How many extra stools have you had in the past 24 hours than normal?"    - MILD: Few loose or mushy BMs; increase of 1-3 stools over normal daily number of stools; mild increase in ostomy output.   - MODERATE: Increase of 4-6 stools daily over normal; moderate increase in ostomy output.   - SEVERE (or Worst Possible): Increase of 7 or more stools daily over normal; moderate increase in ostomy output; incontinence.     10 2. ONSET: "When did the diarrhea begin?"      Started Monday 3. BM CONSISTENCY: "How loose or watery is the diarrhea?"      Loose 4. VOMITING: "Are you also vomiting?" If so, ask: "How many times in the past 24 hours?"      No 5. ABDOMINAL PAIN: "Are you having any abdominal pain?" If yes: "What does it feel like?" (e.g., crampy, dull, intermittent, constant)      Pain 6. ABDOMINAL PAIN SEVERITY: If present, ask: "How bad is the pain?"  (e.g., Scale 1-10; mild, moderate, or severe)    - MILD (1-3): doesn't interfere with normal activities, abdomen soft and not tender to touch     - MODERATE (4-7): interferes with normal activities or awakens from sleep, tender to touch     - SEVERE (8-10): excruciating pain, doubled over, unable to do any normal activities       6-7 7. ORAL INTAKE: If vomiting, "Have you been able to drink liquids?" "How much fluids have you had in the past 24 hours?"     Drinking liquids 8. HYDRATION: "Any signs of dehydration?" (e.g., dry mouth [not just dry lips], too weak to stand, dizziness, new weight loss) "When did you last urinate?"   Voiding well 9. EXPOSURE: "Have you traveled to a foreign country recently?" "Have you been exposed to anyone with diarrhea?" "Could you have eaten any food that was spoiled?"     No 10. OTHER SYMPTOMS: "Do you have any other symptoms?" (e.g., fever, blood in stool)       Blood in stool just in the morning.Red color 11. PREGNANCY: "Is there any chance you are pregnant?" "When was your last menstrual period?"       No  Protocols used: DIARRHEA-A-AH

## 2017-11-17 NOTE — Patient Instructions (Signed)
Drink lots of fluids  Liquid diet today (clear liquids)  Then bland diet as tolerated   If worse diarrhea or abdominal pain or rectal bleeding or fever alert [place] right away   Lab today  Will also get a stool test for culture   Avoid anti diarrhea medicines for now

## 2017-11-17 NOTE — Telephone Encounter (Signed)
Pt has appt to see Dr Glori Bickers 11/17/17 at 12 noon.

## 2017-11-18 ENCOUNTER — Telehealth: Payer: Self-pay | Admitting: Internal Medicine

## 2017-11-18 NOTE — Telephone Encounter (Signed)
Copied from [place] [id number]. Topic: Quick Communication - Lab Results >> Nov 18, 2017  4:29 PM Tammi Sou, CMA wrote: Called patient to inform them of lab results. When patient returns call, triage nurse may disclose results.   Please call back, pt says if no answer to leave detailed message on vm.

## 2017-11-18 NOTE — Addendum Note (Signed)
Addended by: Ellamae Sia on: 11/18/2017 11:39 AM   Modules accepted: Orders

## 2017-11-21 NOTE — Telephone Encounter (Signed)
Results given. See result note. °

## 2017-11-22 LAB — STOOL CULTURE
MICRO NUMBER: 90543036
MICRO NUMBER:: 90543034
MICRO NUMBER:: 90543035
SHIGA RESULT:: NOT DETECTED
SPECIMEN QUALITY: ADEQUATE
SPECIMEN QUALITY:: ADEQUATE
SPECIMEN QUALITY:: ADEQUATE

## 2018-01-20 ENCOUNTER — Ambulatory Visit: Payer: BC Managed Care – PPO | Admitting: Neurology

## 2018-01-20 ENCOUNTER — Other Ambulatory Visit: Payer: Self-pay

## 2018-01-20 ENCOUNTER — Encounter: Payer: Self-pay | Admitting: Neurology

## 2018-01-20 DIAGNOSIS — G40219 Localization-related (focal) (partial) symptomatic epilepsy and epileptic syndromes with complex partial seizures, intractable, without status epilepticus: Secondary | ICD-10-CM | POA: Diagnosis not present

## 2018-01-20 MED ORDER — LAMOTRIGINE ER 100 MG PO TB24: ORAL_TABLET | ORAL | 3 refills | Status: DC

## 2018-01-20 MED ORDER — LEVETIRACETAM 500 MG PO TABS: ORAL_TABLET | ORAL | 3 refills | Status: DC

## 2018-01-20 NOTE — Patient Instructions (Signed)
Great seeing you! Continue all your medications. Proceed with MRI brain when able. Follow-up in 8 months, call for any changes.  Seizure Precautions: 1. If medication has been prescribed for you to prevent seizures, take it exactly as directed.  Do not stop taking the medicine without talking to your doctor first, even if you have not had a seizure in a long time.   2. Avoid activities in which a seizure would cause danger to yourself or to others.  Don't operate dangerous machinery, swim alone, or climb in high or dangerous places, such as on ladders, roofs, or girders.  Do not drive unless your doctor says you may.  3. If you have any warning that you may have a seizure, lay down in a safe place where you can't hurt yourself.    4.  No driving for 6 months from last seizure, as per [facility].   Please refer to the following link on the [place] website for more information: http://www.epilepsyfoundation.org/answerplace/Social/driving/drivingu.cfm   5.  Maintain good sleep hygiene. Avoid alcohol.  6.  Contact your doctor if you have any problems that may be related to the medicine you are taking.  7.  Call 911 and bring the patient back to the ED if:        A.  The seizure lasts longer than 5 minutes.       B.  The patient doesn't awaken shortly after the seizure  C.  The patient has new problems such as difficulty seeing, speaking or moving  D.  The patient was injured during the seizure  E.  The patient has a temperature over 102 F (39C)  F.  The patient vomited and now is having trouble breathing
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
## 2018-05-24 ENCOUNTER — Encounter: Payer: Self-pay | Admitting: Neurology

## 2018-09-07 ENCOUNTER — Ambulatory Visit: Payer: BC Managed Care – PPO | Admitting: Neurology

## 2018-09-07 ENCOUNTER — Other Ambulatory Visit: Payer: Self-pay

## 2018-09-07 ENCOUNTER — Encounter: Payer: Self-pay | Admitting: Neurology

## 2018-09-07 VITALS — BP 126/78 | HR 76 | Ht 71.0 in | Wt 201.0 lb

## 2018-09-07 DIAGNOSIS — G40219 Localization-related (focal) (partial) symptomatic epilepsy and epileptic syndromes with complex partial seizures, intractable, without status epilepticus: Secondary | ICD-10-CM

## 2018-09-07 DIAGNOSIS — C712 Malignant neoplasm of temporal lobe: Secondary | ICD-10-CM

## 2018-09-07 MED ORDER — LEVETIRACETAM 500 MG PO TABS: ORAL_TABLET | ORAL | 3 refills | Status: DC

## 2018-09-07 MED ORDER — LAMOTRIGINE ER 100 MG PO TB24: ORAL_TABLET | ORAL | 3 refills | Status: DC
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
## 2018-09-18 ENCOUNTER — Ambulatory Visit
Admission: RE | Admit: 2018-09-18 | Discharge: 2018-09-18 | Disposition: A | Discharge status: Home / Self Care | Payer: BC Managed Care – PPO | Source: Ambulatory Visit | Attending: Neurology | Admitting: Neurology

## 2018-09-18 DIAGNOSIS — C712 Malignant neoplasm of temporal lobe: Secondary | ICD-10-CM

## 2018-09-18 DIAGNOSIS — G40219 Localization-related (focal) (partial) symptomatic epilepsy and epileptic syndromes with complex partial seizures, intractable, without status epilepticus: Secondary | ICD-10-CM

## 2018-09-18 MED ORDER — GADOBENATE DIMEGLUMINE 529 MG/ML IV SOLN
19.0000 mL | Freq: Once | INTRAVENOUS | Status: AC | PRN
  Administered 2018-09-18: 19 mL via INTRAVENOUS

## 2018-09-20 ENCOUNTER — Telehealth: Payer: Self-pay | Admitting: Neurology

## 2018-09-20 NOTE — Telephone Encounter (Signed)
Patient made aware of MR results.

## 2018-09-20 NOTE — Telephone Encounter (Signed)
-----   Message from Cameron Sprang, MD sent at 09/19/2018  9:13 AM EST ----- Pls let him know the MRI brain did not show any changes from his last MRI. Thanks

## 2019-09-06 ENCOUNTER — Ambulatory Visit: Payer: Self-pay | Admitting: Neurology

## 2019-11-06 ENCOUNTER — Other Ambulatory Visit: Payer: Self-pay | Admitting: Neurology

## 2019-11-06 DIAGNOSIS — G40219 Localization-related (focal) (partial) symptomatic epilepsy and epileptic syndromes with complex partial seizures, intractable, without status epilepticus: Secondary | ICD-10-CM

## 2019-11-07 ENCOUNTER — Telehealth: Payer: Self-pay | Admitting: Neurology

## 2019-11-07 ENCOUNTER — Other Ambulatory Visit: Payer: Self-pay | Admitting: Neurology

## 2019-11-07 ENCOUNTER — Other Ambulatory Visit: Payer: Self-pay

## 2019-11-07 DIAGNOSIS — G40219 Localization-related (focal) (partial) symptomatic epilepsy and epileptic syndromes with complex partial seizures, intractable, without status epilepticus: Secondary | ICD-10-CM

## 2019-11-07 MED ORDER — LEVETIRACETAM 500 MG PO TABS: ORAL_TABLET | ORAL | 0 refills | Status: DC

## 2019-11-07 MED ORDER — LAMOTRIGINE ER 100 MG PO TB24: ORAL_TABLET | ORAL | 0 refills | Status: DC

## 2019-11-07 NOTE — Telephone Encounter (Signed)
Patient called and left a message stating he is out of a medication he has been trying to get refills from his pharmacy for about two weeks without success.   He is now out of his lamotrigine 100 MG completely and needs refills sent in.   Patient also needs refills on Keppra 500 MG and his is almost out of this Rx as well.  [facility] [address]

## 2019-11-07 NOTE — Telephone Encounter (Signed)
Pt called no answer voice mail left to  Inform that script was sent in and that he needs to call the office and schedule an appointment

## 2019-11-29 ENCOUNTER — Other Ambulatory Visit: Payer: Self-pay | Admitting: Neurology

## 2019-11-29 DIAGNOSIS — G40219 Localization-related (focal) (partial) symptomatic epilepsy and epileptic syndromes with complex partial seizures, intractable, without status epilepticus: Secondary | ICD-10-CM

## 2019-12-13 ENCOUNTER — Other Ambulatory Visit: Payer: Self-pay

## 2019-12-13 ENCOUNTER — Encounter: Payer: Self-pay | Admitting: Neurology

## 2019-12-13 ENCOUNTER — Telehealth (INDEPENDENT_AMBULATORY_CARE_PROVIDER_SITE_OTHER): Payer: BC Managed Care – PPO | Admitting: Neurology

## 2019-12-13 VITALS — Ht 71.75 in | Wt 195.0 lb

## 2019-12-13 DIAGNOSIS — C712 Malignant neoplasm of temporal lobe: Secondary | ICD-10-CM | POA: Diagnosis not present

## 2019-12-13 DIAGNOSIS — G40219 Localization-related (focal) (partial) symptomatic epilepsy and epileptic syndromes with complex partial seizures, intractable, without status epilepticus: Secondary | ICD-10-CM

## 2019-12-13 MED ORDER — LAMOTRIGINE ER 100 MG PO TB24: 1.0000 | ORAL_TABLET | Freq: Every day | ORAL | 11 refills | Status: DC

## 2019-12-13 MED ORDER — LEVETIRACETAM 500 MG PO TABS: 1500.0000 mg | ORAL_TABLET | Freq: Two times a day (BID) | ORAL | 11 refills | Status: DC
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
## 2020-03-26 ENCOUNTER — Other Ambulatory Visit: Payer: Self-pay | Admitting: *Deleted

## 2020-03-26 ENCOUNTER — Other Ambulatory Visit: Payer: Self-pay

## 2020-03-26 ENCOUNTER — Other Ambulatory Visit: Payer: BC Managed Care – PPO

## 2020-03-26 DIAGNOSIS — Z20822 Contact with and (suspected) exposure to covid-19: Secondary | ICD-10-CM

## 2020-03-28 LAB — SARS-COV-2, NAA 2 DAY TAT

## 2020-03-28 LAB — NOVEL CORONAVIRUS, NAA: SARS-CoV-2, NAA: NOT DETECTED

## 2020-04-08 ENCOUNTER — Other Ambulatory Visit: Payer: Self-pay

## 2020-12-12 ENCOUNTER — Encounter: Payer: Self-pay | Admitting: Neurology

## 2020-12-12 ENCOUNTER — Ambulatory Visit: Payer: BC Managed Care – PPO | Admitting: Neurology

## 2020-12-12 ENCOUNTER — Other Ambulatory Visit: Payer: Self-pay

## 2020-12-12 VITALS — BP 116/80 | HR 76 | Ht 71.0 in | Wt 199.2 lb

## 2020-12-12 DIAGNOSIS — C712 Malignant neoplasm of temporal lobe: Secondary | ICD-10-CM | POA: Diagnosis not present

## 2020-12-12 DIAGNOSIS — G40219 Localization-related (focal) (partial) symptomatic epilepsy and epileptic syndromes with complex partial seizures, intractable, without status epilepticus: Secondary | ICD-10-CM | POA: Diagnosis not present

## 2020-12-12 MED ORDER — LEVETIRACETAM 500 MG PO TABS: 1500.0000 mg | ORAL_TABLET | Freq: Two times a day (BID) | ORAL | 11 refills | Status: DC

## 2020-12-12 MED ORDER — LAMOTRIGINE ER 100 MG PO TB24
1.0000 | ORAL_TABLET | Freq: Every day | ORAL | 11 refills | Status: DC
Start: 2020-12-12 — End: 2021-12-11

## 2020-12-12 NOTE — Patient Instructions (Signed)
Always good to see you. Continue Keppra 500mg : take 3 tablets twice a day and Lamictal XR 100mg  daily. We will plan for repeat brain MRI in January 2023. Follow-up in 1 year, call for any changes   Seizure Precautions: 1. If medication has been prescribed for you to prevent seizures, take it exactly as directed.  Do not stop taking the medicine without talking to your doctor first, even if you have not had a seizure in a long time.   2. Avoid activities in which a seizure would cause danger to yourself or to others.  Don't operate dangerous machinery, swim alone, or climb in high or dangerous places, such as on ladders, roofs, or girders.  Do not drive unless your doctor says you may.  3. If you have any warning that you may have a seizure, lay down in a safe place where you can't hurt yourself.    4.  No driving for 6 months from last seizure, as per [facility].   Please refer to the following link on the [place] website for more information: http://www.epilepsyfoundation.org/answerplace/Social/driving/drivingu.cfm   5.  Maintain good sleep hygiene. Avoid alcohol.  6.  Contact your doctor if you have any problems that may be related to the medicine you are taking.  7.  Call 911 and bring the patient back to the ED if:        A.  The seizure lasts longer than 5 minutes.       B.  The patient doesn't awaken shortly after the seizure  C.  The patient has new problems such as difficulty seeing, speaking or moving  D.  The patient was injured during the seizure  E.  The patient has a temperature over 102 F (39C)  F.  The patient vomited and now is having trouble breathing
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
## 2021-07-19 ENCOUNTER — Encounter: Payer: Self-pay | Admitting: Emergency Medicine

## 2021-07-19 ENCOUNTER — Ambulatory Visit
Admission: EM | Admit: 2021-07-19 | Discharge: 2021-07-19 | Disposition: A | Discharge status: Home / Self Care | Payer: BC Managed Care – PPO | Attending: Emergency Medicine | Admitting: Emergency Medicine

## 2021-07-19 DIAGNOSIS — H6692 Otitis media, unspecified, left ear: Secondary | ICD-10-CM | POA: Diagnosis not present

## 2021-07-19 MED ORDER — AMOXICILLIN 875 MG PO TABS: 875.0000 mg | ORAL_TABLET | Freq: Two times a day (BID) | ORAL | 0 refills | Status: AC

## 2021-07-19 NOTE — ED Provider Notes (Signed)
[name]    CSN: [id number] Arrival date & time: 07/19/21  1306      History   Chief Complaint Chief Complaint  Patient presents with   Otalgia    HPI Corey Chang is a 41 y.o. male.  Patient presents with 2-day history of left ear pain and muffled hearing.  Treatment attempted at home with OTC eardrops and Advil.  He denies fever, rash, sore throat, cough, shortness of breath, or other symptoms.  His medical history includes allergic rhinitis, seizures, oligodendroglioma of temporal lobe, GERD,   The history is provided by the patient and medical records.   Past Medical History:  Diagnosis Date   Allergic rhinitis 04/22/97   Ankle fracture, right 07/2007   Oligodendroglioma ([place]) 2001   Left temporal; bx in 12/1999.  He had external beam radiation in August 2001 with 54 Gy in 30 fractions on RTOG 9802 protocol.   He declined chemotherapy.    Seizure ([place])    from history of oligodendroglioma    Patient Active Problem List   Diagnosis Date Noted   Bloody diarrhea 11/17/2017   History of oligodendroglioma of brain 03/22/2017   Status post radiation therapy 03/22/2017   GERD (gastroesophageal reflux disease) 11/11/2016   Seizures ([place]) 12/10/2015   Oligodendroglioma of temporal lobe ([place]) 12/18/2014   Complex partial seizure ([place]) 12/21/2012   Seasonal allergies 07/08/2008   SMOKELESS TOBACCO ABUSE 08/02/2007   Intracranial tumor ([place]) 11/17/1999    Past Surgical History:  Procedure Laterality Date   ANKLE SURGERY     right, x 3   brain biopsy  1997, 2001       Home Medications    Prior to Admission medications   Medication Sig Start Date End Date Taking? Authorizing Provider  amoxicillin (AMOXIL) 875 MG tablet Take 1 tablet (875 mg total) by mouth 2 (two) times daily for 10 days. 07/19/21 07/29/21 Yes Sharion Balloon, NP  Esomeprazole Magnesium (NEXIUM PO) Take by mouth.    [provider]  LamoTRIgine 100 MG TB24 24 hour tablet Take 1  tablet (100 mg total) by mouth daily. 12/12/20   Cameron Sprang, MD  levETIRAcetam (KEPPRA) 500 MG tablet Take 3 tablets (1,500 mg total) by mouth 2 (two) times daily. 12/12/20   Cameron Sprang, MD  loratadine (CLARITIN) 10 MG tablet Take 10 mg by mouth daily.    [provider]  Naproxen Sodium (ALEVE PO) Take 1 tablet by mouth 2 (two) times daily.     [provider]    Family History Family History  Problem Relation Age of Onset   Alzheimer's disease Mother    Diabetes Father    Heart disease Maternal Grandfather        MI   Parkinsonism Paternal Grandmother    Alzheimer's disease Paternal Grandmother    Depression Neg Hx    Drug abuse Neg Hx    Alcohol abuse Neg Hx    Cancer Neg Hx        no colon, prostate, breast, uterine,ovarian    Social History Social History   Tobacco Use   Smoking status: Former    Types: Cigarettes   Smokeless tobacco: Current    Types: Snuff   Tobacco comments:    Quit 2006  Vaping Use   Vaping Use: Never used  Substance Use Topics   Alcohol use: Yes    Alcohol/week: 2.0 standard drinks    Types: 2 Cans of beer per week  Comment: 2 beers per week   Drug use: No     Allergies   Carbatrol [carbamazepine] and Sulfa antibiotics   Review of Systems Review of Systems  Constitutional:  Negative for chills and fever.  HENT:  Positive for ear pain and hearing loss. Negative for sore throat.   Respiratory:  Negative for cough and shortness of breath.   Cardiovascular:  Negative for chest pain and palpitations.  Gastrointestinal:  Negative for diarrhea and vomiting.  Skin:  Negative for color change and rash.  All other systems reviewed and are negative.   Physical Exam Triage Vital Signs ED Triage Vitals  Enc Vitals Group     BP      Pulse      Resp      Temp      Temp src      SpO2      Weight      Height      Head Circumference      Peak Flow      Pain Score      Pain Loc      Pain Edu?      Excl. in  [place]?    No data found.  Updated Vital Signs BP (!) 148/82    Pulse (!) 102    Temp 99.1 F (37.3 C)    Resp 20    SpO2 98%   Visual Acuity Right Eye Distance:   Left Eye Distance:   Bilateral Distance:    Right Eye Near:   Left Eye Near:    Bilateral Near:     Physical Exam Vitals and nursing note reviewed.  Constitutional:      General: He is not in acute distress.    Appearance: He is well-developed. He is not ill-appearing.  HENT:     Right Ear: Tympanic membrane and ear canal normal. Tympanic membrane is not erythematous.     Left Ear: Ear canal normal. Tympanic membrane is erythematous.     Nose: Nose normal.     Mouth/Throat:     Mouth: Mucous membranes are moist.     Pharynx: Oropharynx is clear.  Cardiovascular:     Rate and Rhythm: Normal rate and regular rhythm.     Heart sounds: Normal heart sounds.  Pulmonary:     Effort: Pulmonary effort is normal. No respiratory distress.     Breath sounds: Normal breath sounds.  Musculoskeletal:     Cervical back: Neck supple.  Skin:    General: Skin is warm and dry.  Neurological:     Mental Status: He is alert.  Psychiatric:        Mood and Affect: Mood normal.        Behavior: Behavior normal.     UC Treatments / Results  Labs (all labs ordered are listed, but only abnormal results are displayed) Labs Reviewed - No data to display  EKG   Radiology No results found.  Procedures Procedures (including critical care time)  Medications Ordered in UC Medications - No data to display  Initial Impression / Assessment and Plan / UC Course  I have reviewed the triage vital signs and the nursing notes.  Pertinent labs & imaging results that were available during my care of the patient were reviewed by me and considered in my medical decision making (see chart for details).    Left otitis media.  Treating with amoxicillin.  Tylenol as needed for discomfort.  Instructed patient to follow-up with  his PCP if his  symptoms are not improving.  He agrees to plan of care.  Final Clinical Impressions(s) / UC Diagnoses   Final diagnoses:  Left otitis media, unspecified otitis media type     Discharge Instructions      Take the amoxicillin as directed.  Take Tylenol as needed for discomfort.  Follow up with your primary care provider if your symptoms are not improving.         ED Prescriptions     Medication Sig Dispense Auth. Provider   amoxicillin (AMOXIL) 875 MG tablet Take 1 tablet (875 mg total) by mouth 2 (two) times daily for 10 days. 20 tablet Sharion Balloon, NP      PDMP not reviewed this encounter.   Sharion Balloon, NP 07/19/21 [phone]

## 2021-07-19 NOTE — ED Triage Notes (Signed)
Pt here with left ear pain x 2 days. Hearing is muffled and ear throbbing keeps him up at night.

## 2021-07-19 NOTE — Discharge Instructions (Addendum)
Take the amoxicillin as directed.  Take Tylenol as needed for discomfort.  Follow up with your primary care provider if your symptoms are not improving.

## 2021-07-21 ENCOUNTER — Other Ambulatory Visit: Payer: Self-pay

## 2021-07-21 DIAGNOSIS — C712 Malignant neoplasm of temporal lobe: Secondary | ICD-10-CM

## 2021-07-21 DIAGNOSIS — G40219 Localization-related (focal) (partial) symptomatic epilepsy and epileptic syndromes with complex partial seizures, intractable, without status epilepticus: Secondary | ICD-10-CM

## 2021-08-14 ENCOUNTER — Ambulatory Visit
Admission: RE | Admit: 2021-08-14 | Discharge: 2021-08-14 | Disposition: A | Discharge status: Home / Self Care | Payer: BC Managed Care – PPO | Source: Ambulatory Visit | Attending: Neurology | Admitting: Neurology

## 2021-08-14 DIAGNOSIS — G40219 Localization-related (focal) (partial) symptomatic epilepsy and epileptic syndromes with complex partial seizures, intractable, without status epilepticus: Secondary | ICD-10-CM

## 2021-08-14 DIAGNOSIS — C712 Malignant neoplasm of temporal lobe: Secondary | ICD-10-CM

## 2021-08-14 MED ORDER — GADOBENATE DIMEGLUMINE 529 MG/ML IV SOLN
20.0000 mL | Freq: Once | INTRAVENOUS | Status: AC | PRN
  Administered 2021-08-14: 20 mL via INTRAVENOUS

## 2021-08-20 ENCOUNTER — Telehealth: Payer: Self-pay

## 2021-08-20 NOTE — Telephone Encounter (Signed)
-----   Message from Cameron Sprang, MD sent at 08/17/2021  2:14 PM EST ----- Pls let him know the brain MRI did not show any new changes from the scan done in 2020. Thanks

## 2021-08-20 NOTE — Telephone Encounter (Signed)
Pt called no answer left a voice mail per DPR  brain MRI did not show any new changes from the scan done in 2020

## 2021-10-06 ENCOUNTER — Ambulatory Visit: Payer: BC Managed Care – PPO | Admitting: Family Medicine

## 2021-10-06 ENCOUNTER — Encounter: Payer: Self-pay | Admitting: Family Medicine

## 2021-10-06 ENCOUNTER — Other Ambulatory Visit: Payer: Self-pay

## 2021-10-06 VITALS — BP 128/90 | HR 75 | Temp 98.1°F | Ht 71.0 in | Wt 203.0 lb

## 2021-10-06 DIAGNOSIS — F17200 Nicotine dependence, unspecified, uncomplicated: Secondary | ICD-10-CM

## 2021-10-06 DIAGNOSIS — H6992 Unspecified Eustachian tube disorder, left ear: Secondary | ICD-10-CM | POA: Insufficient documentation

## 2021-10-06 DIAGNOSIS — H6982 Other specified disorders of Eustachian tube, left ear: Secondary | ICD-10-CM

## 2021-10-06 DIAGNOSIS — C712 Malignant neoplasm of temporal lobe: Secondary | ICD-10-CM

## 2021-10-06 DIAGNOSIS — K219 Gastro-esophageal reflux disease without esophagitis: Secondary | ICD-10-CM

## 2021-10-06 DIAGNOSIS — G40219 Localization-related (focal) (partial) symptomatic epilepsy and epileptic syndromes with complex partial seizures, intractable, without status epilepticus: Secondary | ICD-10-CM

## 2021-10-06 NOTE — Assessment & Plan Note (Signed)
Dr. Delice Lesch is neurology - on keppra 1500 mg bid and lamotrigine. No hx of seizure - on preventative treatment due to hx of tumor ?

## 2021-10-06 NOTE — Assessment & Plan Note (Signed)
Controlled on nexium - symptoms if he stops ?

## 2021-10-06 NOTE — Progress Notes (Signed)
? ?Subjective:  ? ?  ?Corey Chang is a 41 y.o. male presenting for Transitions Of Care (Would like an rx for an albuterol inhaler to use as needed for allergies ) and Ear Fullness (L since his ear infection on new years day) ?  ? ? ?HPI ? ?#GERD ?- taking nexium to reduce acidity and sinus infection ?- allergy doctor started ? ?#Left ear infection ?- 3 months ago ?- treated ?- ear fullness since then ?- no pain ?- no hearing loss ?- no sinus pressure/pain ?- drainage and green discharge ?- taking claritin with the allergy ? ?Review of Systems ? ? ?Social History  ? ?Tobacco Use  ?Smoking Status Former  ? Packs/day: 0.50  ? Types: Cigarettes  ? Quit date: 07/19/2006  ? Years since quitting: 15.2  ?Smokeless Tobacco Current  ? Types: Snuff  ?Tobacco Comments  ? Quit 2006  ? ? ? ?   ?Objective:  ?  ?BP Readings from Last 3 Encounters:  ?10/06/21 128/90  ?07/19/21 (!) 148/82  ?12/12/20 116/80  ? ?Wt Readings from Last 3 Encounters:  ?10/06/21 203 lb (92.1 kg)  ?12/12/20 199 lb 3.2 oz (90.4 kg)  ?12/13/19 195 lb (88.5 kg)  ? ? ?BP 128/90   Pulse 75   Temp 98.1 ?F (36.7 ?C) (Oral)   Ht '5\' 11"'$  (1.803 m)   Wt 203 lb (92.1 kg)   SpO2 99%   BMI 28.31 kg/m?  ? ? ?Physical Exam ?Constitutional:   ?   General: He is not in acute distress. ?   Appearance: Normal appearance. He is well-developed. He is not ill-appearing or diaphoretic.  ?HENT:  ?   Head: Normocephalic and atraumatic.  ?   Right Ear: Tympanic membrane, ear canal and external ear normal.  ?   Left Ear: Ear canal and external ear normal. A middle ear effusion is present. Tympanic membrane is not erythematous or bulging.  ?   Nose: Nose normal.  ?   Right Sinus: No maxillary sinus tenderness or frontal sinus tenderness.  ?   Left Sinus: No maxillary sinus tenderness or frontal sinus tenderness.  ?   Mouth/Throat:  ?   Pharynx: Uvula midline. Posterior oropharyngeal erythema present. No oropharyngeal exudate.  ?   Tonsils: 0 on the right. 0 on the left.   ?Eyes:  ?   General: No scleral icterus. ?   Extraocular Movements: Extraocular movements intact.  ?   Conjunctiva/sclera: Conjunctivae normal.  ?Cardiovascular:  ?   Rate and Rhythm: Normal rate and regular rhythm.  ?   Heart sounds: No murmur heard. ?Pulmonary:  ?   Effort: Pulmonary effort is normal. No respiratory distress.  ?   Breath sounds: Normal breath sounds.  ?Musculoskeletal:  ?   Cervical back: Neck supple.  ?Lymphadenopathy:  ?   Cervical: No cervical adenopathy.  ?Skin: ?   General: Skin is warm and dry.  ?   Capillary Refill: Capillary refill takes less than 2 seconds.  ?Neurological:  ?   Mental Status: He is alert. Mental status is at baseline.  ?Psychiatric:     ?   Mood and Affect: Mood normal.  ? ? ? ? ? ?   ?Assessment & Plan:  ? ?Problem List Items Addressed This Visit   ? ?  ? Digestive  ? GERD (gastroesophageal reflux disease) - Primary  ?  Controlled on nexium - symptoms if he stops ?  ?  ?  ? Nervous and Auditory  ?  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  Dr. Delice Lesch is neurology - on keppra 1500 mg bid and lamotrigine. No hx of seizure - on preventative treatment due to hx of tumor ?  ?  ? 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  S/p irradiation. No surgery. Follows with neurology and getting MRI every 5 years.  ?  ?  ? Eustachian tube dysfunction, left  ?  No sign of an infection, suspect this might be a little bit of ETD.  Advised trial of Flonase and Sudafed in addition to his allergy treatment.  If not improving can refer to ENT. ?  ?  ?  ? Other  ? SMOKELESS TOBACCO ABUSE  ?  Follows with the dentist, does not want to quit at this time. ?  ?  ? ? ? ?Return in about 1 year (around 10/07/2022), or if symptoms worsen or fail to improve. ? ?Lesleigh Noe, MD ? ?This visit occurred during the SARS-CoV-2 public health emergency.  Safety protocols were in place, including screening questions prior to the visit, additional usage of staff PPE, and extensive cleaning of exam room while observing  appropriate contact time as indicated for disinfecting solutions.  ? ?

## 2021-10-06 NOTE — Assessment & Plan Note (Signed)
S/p irradiation. No surgery. Follows with neurology and getting MRI every 5 years.  ?

## 2021-10-06 NOTE — Assessment & Plan Note (Signed)
Follows with the dentist, does not want to quit at this time. ?

## 2021-10-06 NOTE — Patient Instructions (Addendum)
Ear pressure ?- try flonase and or pseudoephedrine ?- if no improvement - let me know and we can send you to ENT ? ?Bring your labs from the fire department or send via mychart ?

## 2021-10-06 NOTE — Assessment & Plan Note (Signed)
No sign of an infection, suspect this might be a little bit of ETD.  Advised trial of Flonase and Sudafed in addition to his allergy treatment.  If not improving can refer to ENT. ?

## 2021-12-11 ENCOUNTER — Encounter: Payer: Self-pay | Admitting: Neurology

## 2021-12-11 ENCOUNTER — Ambulatory Visit: Payer: BC Managed Care – PPO | Admitting: Neurology

## 2021-12-11 DIAGNOSIS — G40219 Localization-related (focal) (partial) symptomatic epilepsy and epileptic syndromes with complex partial seizures, intractable, without status epilepticus: Secondary | ICD-10-CM | POA: Diagnosis not present

## 2021-12-11 MED ORDER — LAMOTRIGINE ER 100 MG PO TB24: 100.0000 mg | ORAL_TABLET | Freq: Every day | ORAL | 3 refills | Status: DC

## 2021-12-11 MED ORDER — LEVETIRACETAM 500 MG PO TABS: 1500.0000 mg | ORAL_TABLET | Freq: Two times a day (BID) | ORAL | 3 refills | Status: DC
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
## 2022-04-02 ENCOUNTER — Other Ambulatory Visit: Payer: Self-pay

## 2022-04-02 ENCOUNTER — Observation Stay (HOSPITAL_COMMUNITY)
Admission: EM | Admit: 2022-04-02 | Discharge: 2022-04-04 | Disposition: A | Discharge status: Home / Self Care | Payer: BC Managed Care – PPO | Attending: Internal Medicine | Admitting: Internal Medicine

## 2022-04-02 ENCOUNTER — Emergency Department (HOSPITAL_COMMUNITY): Payer: BC Managed Care – PPO

## 2022-04-02 DIAGNOSIS — R55 Syncope and collapse: Secondary | ICD-10-CM | POA: Diagnosis not present

## 2022-04-02 DIAGNOSIS — F17290 Nicotine dependence, other tobacco product, uncomplicated: Secondary | ICD-10-CM | POA: Insufficient documentation

## 2022-04-02 DIAGNOSIS — R778 Other specified abnormalities of plasma proteins: Secondary | ICD-10-CM | POA: Diagnosis not present

## 2022-04-02 DIAGNOSIS — J9811 Atelectasis: Secondary | ICD-10-CM | POA: Diagnosis not present

## 2022-04-02 DIAGNOSIS — M879 Osteonecrosis, unspecified: Secondary | ICD-10-CM | POA: Insufficient documentation

## 2022-04-02 DIAGNOSIS — S81012A Laceration without foreign body, left knee, initial encounter: Secondary | ICD-10-CM | POA: Insufficient documentation

## 2022-04-02 DIAGNOSIS — K8020 Calculus of gallbladder without cholecystitis without obstruction: Secondary | ICD-10-CM | POA: Diagnosis not present

## 2022-04-02 DIAGNOSIS — R7989 Other specified abnormal findings of blood chemistry: Secondary | ICD-10-CM

## 2022-04-02 DIAGNOSIS — G40219 Localization-related (focal) (partial) symptomatic epilepsy and epileptic syndromes with complex partial seizures, intractable, without status epilepticus: Secondary | ICD-10-CM

## 2022-04-02 DIAGNOSIS — J309 Allergic rhinitis, unspecified: Secondary | ICD-10-CM

## 2022-04-02 DIAGNOSIS — K219 Gastro-esophageal reflux disease without esophagitis: Secondary | ICD-10-CM | POA: Diagnosis present

## 2022-04-02 DIAGNOSIS — Z79899 Other long term (current) drug therapy: Secondary | ICD-10-CM | POA: Insufficient documentation

## 2022-04-02 DIAGNOSIS — Z23 Encounter for immunization: Secondary | ICD-10-CM | POA: Insufficient documentation

## 2022-04-02 DIAGNOSIS — M87052 Idiopathic aseptic necrosis of left femur: Secondary | ICD-10-CM

## 2022-04-02 DIAGNOSIS — R7401 Elevation of levels of liver transaminase levels: Secondary | ICD-10-CM

## 2022-04-02 DIAGNOSIS — Y9279 Other farm location as the place of occurrence of the external cause: Secondary | ICD-10-CM | POA: Insufficient documentation

## 2022-04-02 DIAGNOSIS — M6282 Rhabdomyolysis: Secondary | ICD-10-CM | POA: Insufficient documentation

## 2022-04-02 DIAGNOSIS — G40909 Epilepsy, unspecified, not intractable, without status epilepticus: Secondary | ICD-10-CM

## 2022-04-02 DIAGNOSIS — Z20822 Contact with and (suspected) exposure to covid-19: Secondary | ICD-10-CM | POA: Insufficient documentation

## 2022-04-02 DIAGNOSIS — M542 Cervicalgia: Secondary | ICD-10-CM | POA: Diagnosis present

## 2022-04-02 LAB — COMPREHENSIVE METABOLIC PANEL
ALT: 69 U/L — ABNORMAL HIGH (ref 0–44)
AST: 107 U/L — ABNORMAL HIGH (ref 15–41)
Albumin: 4.4 g/dL (ref 3.5–5.0)
Alkaline Phosphatase: 66 U/L (ref 38–126)
Anion gap: 12 (ref 5–15)
BUN: 11 mg/dL (ref 6–20)
CO2: 22 mmol/L (ref 22–32)
Calcium: 9.6 mg/dL (ref 8.9–10.3)
Chloride: 101 mmol/L (ref 98–111)
Creatinine, Ser: 1.23 mg/dL (ref 0.61–1.24)
GFR, Estimated: >60 mL/min (ref >60)
Glucose, Bld: 113 mg/dL — ABNORMAL HIGH (ref 70–99)
Potassium: 4 mmol/L (ref 3.5–5.1)
Sodium: 135 mmol/L (ref 135–145)
Total Bilirubin: 0.7 mg/dL (ref 0.3–1.2)
Total Protein: 6.7 g/dL (ref 6.5–8.1)

## 2022-04-02 LAB — RESP PANEL BY RT-PCR (FLU A&B, COVID) ARPGX2
Influenza A by PCR: NEGATIVE
Influenza B by PCR: NEGATIVE
SARS Coronavirus 2 by RT PCR: NEGATIVE

## 2022-04-02 LAB — CBC
HCT: 48.9 % (ref 39.0–52.0)
Hemoglobin: 16.6 g/dL (ref 13.0–17.0)
MCH: 31 pg (ref 26.0–34.0)
MCHC: 33.9 g/dL (ref 30.0–36.0)
MCV: 91.4 fL (ref 80.0–100.0)
Platelets: 223 10*3/uL (ref 150–400)
RBC: 5.35 MIL/uL (ref 4.22–5.81)
RDW: 13.4 % (ref 11.5–15.5)
WBC: 10.1 10*3/uL (ref 4.0–10.5)
nRBC: 0 % (ref 0.0–0.2)

## 2022-04-02 LAB — I-STAT CHEM 8, ED
BUN: 12 mg/dL (ref 6–20)
Calcium, Ion: 1.07 mmol/L — ABNORMAL LOW (ref 1.15–1.40)
Chloride: 104 mmol/L (ref 98–111)
Creatinine, Ser: 1.1 mg/dL (ref 0.61–1.24)
Glucose, Bld: 110 mg/dL — ABNORMAL HIGH (ref 70–99)
HCT: 50 % (ref 39.0–52.0)
Hemoglobin: 17 g/dL (ref 13.0–17.0)
Potassium: 3.9 mmol/L (ref 3.5–5.1)
Sodium: 136 mmol/L (ref 135–145)
TCO2: 21 mmol/L — ABNORMAL LOW (ref 22–32)

## 2022-04-02 LAB — LACTIC ACID, PLASMA: Lactic Acid, Venous: 1.9 mmol/L (ref 0.5–1.9)

## 2022-04-02 LAB — TROPONIN I (HIGH SENSITIVITY): Troponin I (High Sensitivity): 60 ng/L — ABNORMAL HIGH (ref <18)

## 2022-04-02 LAB — SAMPLE TO BLOOD BANK

## 2022-04-02 LAB — PROTIME-INR
INR: 1 (ref 0.8–1.2)
Prothrombin Time: 13 seconds (ref 11.4–15.2)

## 2022-04-02 LAB — ETHANOL: Alcohol, Ethyl (B): <10 mg/dL (ref <10)

## 2022-04-02 MED ORDER — LIDOCAINE HCL (PF) 1 % IJ SOLN
5.0000 mL | Freq: Once | INTRAMUSCULAR | Status: AC
  Administered 2022-04-02: 5 mL via INTRADERMAL
  Filled 2022-04-02: qty 5

## 2022-04-02 MED ORDER — IOHEXOL 350 MG/ML SOLN
75.0000 mL | Freq: Once | INTRAVENOUS | Status: AC | PRN
  Administered 2022-04-02: 75 mL via INTRAVENOUS

## 2022-04-02 MED ORDER — ONDANSETRON HCL 4 MG/2ML IJ SOLN
4.0000 mg | Freq: Once | INTRAMUSCULAR | Status: AC
  Administered 2022-04-02: 4 mg via INTRAVENOUS
  Filled 2022-04-02: qty 2

## 2022-04-02 MED ORDER — SODIUM CHLORIDE 0.9 % IV BOLUS
500.0000 mL | Freq: Once | INTRAVENOUS | Status: AC
  Administered 2022-04-02: 500 mL via INTRAVENOUS

## 2022-04-02 MED ORDER — MORPHINE SULFATE (PF) 4 MG/ML IV SOLN
4.0000 mg | Freq: Once | INTRAVENOUS | Status: AC
  Administered 2022-04-02: 4 mg via INTRAVENOUS
  Filled 2022-04-02: qty 1

## 2022-04-02 MED ORDER — TETANUS-DIPHTH-ACELL PERTUSSIS 5-2.5-18.5 LF-MCG/0.5 IM SUSY
0.5000 mL | PREFILLED_SYRINGE | Freq: Once | INTRAMUSCULAR | Status: AC
  Administered 2022-04-02: 0.5 mL via INTRAMUSCULAR
  Filled 2022-04-02: qty 0.5

## 2022-04-02 NOTE — ED Notes (Signed)
Patient transported to X-ray 

## 2022-04-02 NOTE — ED Notes (Signed)
Patient returned to room from CT. 
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
## 2022-04-02 NOTE — ED Notes (Signed)
Patient in CT
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
## 2022-04-03 ENCOUNTER — Observation Stay (HOSPITAL_BASED_OUTPATIENT_CLINIC_OR_DEPARTMENT_OTHER): Payer: BC Managed Care – PPO

## 2022-04-03 ENCOUNTER — Observation Stay (HOSPITAL_COMMUNITY): Payer: BC Managed Care – PPO

## 2022-04-03 DIAGNOSIS — R7401 Elevation of levels of liver transaminase levels: Secondary | ICD-10-CM

## 2022-04-03 DIAGNOSIS — M87052 Idiopathic aseptic necrosis of left femur: Secondary | ICD-10-CM

## 2022-04-03 DIAGNOSIS — G40909 Epilepsy, unspecified, not intractable, without status epilepticus: Secondary | ICD-10-CM

## 2022-04-03 DIAGNOSIS — R55 Syncope and collapse: Secondary | ICD-10-CM

## 2022-04-03 DIAGNOSIS — R778 Other specified abnormalities of plasma proteins: Secondary | ICD-10-CM

## 2022-04-03 DIAGNOSIS — J309 Allergic rhinitis, unspecified: Secondary | ICD-10-CM

## 2022-04-03 LAB — HEPATIC FUNCTION PANEL
ALT: 69 U/L — ABNORMAL HIGH (ref 0–44)
AST: 132 U/L — ABNORMAL HIGH (ref 15–41)
Albumin: 4 g/dL (ref 3.5–5.0)
Alkaline Phosphatase: 63 U/L (ref 38–126)
Bilirubin, Direct: 0.1 mg/dL (ref 0.0–0.2)
Indirect Bilirubin: 0.4 mg/dL (ref 0.3–0.9)
Total Bilirubin: 0.5 mg/dL (ref 0.3–1.2)
Total Protein: 6.6 g/dL (ref 6.5–8.1)

## 2022-04-03 LAB — TROPONIN I (HIGH SENSITIVITY)
Troponin I (High Sensitivity): 156 ng/L (ref <18)
Troponin I (High Sensitivity): 115 ng/L (ref <18)

## 2022-04-03 LAB — HEMOGLOBIN A1C: Hgb A1c MFr Bld: 4.9 % (ref 4.8–5.6)

## 2022-04-03 LAB — LIPID PANEL
Cholesterol: 203 mg/dL — ABNORMAL HIGH (ref 0–200)
HDL: 23 mg/dL — ABNORMAL LOW (ref >40)
LDL Cholesterol: 108 mg/dL — ABNORMAL HIGH (ref 0–99)
Total CHOL/HDL Ratio: 8.8 RATIO
Triglycerides: 359 mg/dL — ABNORMAL HIGH (ref <150)
VLDL: 72 mg/dL — ABNORMAL HIGH (ref 0–40)

## 2022-04-03 LAB — URINALYSIS, ROUTINE W REFLEX MICROSCOPIC
Bilirubin Urine: NEGATIVE
Glucose, UA: NEGATIVE mg/dL
Ketones, ur: NEGATIVE mg/dL
Leukocytes,Ua: NEGATIVE
Nitrite: NEGATIVE
Protein, ur: NEGATIVE mg/dL
Specific Gravity, Urine: 1.01 (ref 1.005–1.030)
pH: 5.5 (ref 5.0–8.0)

## 2022-04-03 LAB — URINALYSIS, MICROSCOPIC (REFLEX)
Bacteria, UA: NONE SEEN
WBC, UA: NONE SEEN WBC/hpf (ref 0–5)

## 2022-04-03 LAB — ECHOCARDIOGRAM COMPLETE
AR max vel: 2.86 cm2
AV Area VTI: 2.97 cm2
AV Area mean vel: 2.74 cm2
AV Mean grad: 2 mmHg
AV Peak grad: 4.1 mmHg
Ao pk vel: 1.01 m/s
Area-P 1/2: 3.99 cm2
S' Lateral: 3 cm

## 2022-04-03 LAB — CK: Total CK: 9348 U/L — ABNORMAL HIGH (ref 49–397)

## 2022-04-03 LAB — MAGNESIUM: Magnesium: 1.8 mg/dL (ref 1.7–2.4)

## 2022-04-03 LAB — HIV ANTIBODY (ROUTINE TESTING W REFLEX): HIV Screen 4th Generation wRfx: NONREACTIVE

## 2022-04-03 MED ORDER — LAMOTRIGINE ER 100 MG PO TB24
100.0000 mg | ORAL_TABLET | Freq: Every day | ORAL | Status: DC
  Filled 2022-04-03: qty 1

## 2022-04-03 MED ORDER — ROSUVASTATIN CALCIUM 20 MG PO TABS
20.0000 mg | ORAL_TABLET | Freq: Every day | ORAL | Status: DC
  Administered 2022-04-03: 20 mg via ORAL
  Filled 2022-04-03 (×2): qty 1

## 2022-04-03 MED ORDER — LEVETIRACETAM 500 MG PO TABS
1500.0000 mg | ORAL_TABLET | Freq: Two times a day (BID) | ORAL | Status: DC
  Administered 2022-04-03 – 2022-04-04 (×3): 1500 mg via ORAL
  Filled 2022-04-03 (×3): qty 3

## 2022-04-03 MED ORDER — CETIRIZINE HCL 10 MG PO TABS
10.0000 mg | ORAL_TABLET | Freq: Every evening | ORAL | Status: DC
  Administered 2022-04-03: 10 mg via ORAL
  Filled 2022-04-03 (×3): qty 1

## 2022-04-03 MED ORDER — LAMOTRIGINE ER 50 MG PO TB24
150.0000 mg | ORAL_TABLET | Freq: Every day | ORAL | Status: DC
  Administered 2022-04-03 – 2022-04-04 (×2): 150 mg via ORAL
  Filled 2022-04-03 (×3): qty 1

## 2022-04-03 MED ORDER — IOHEXOL 350 MG/ML SOLN
80.0000 mL | Freq: Once | INTRAVENOUS | Status: AC | PRN
  Administered 2022-04-03: 80 mL via INTRAVENOUS

## 2022-04-03 MED ORDER — PANTOPRAZOLE SODIUM 40 MG PO TBEC
40.0000 mg | DELAYED_RELEASE_TABLET | Freq: Every day | ORAL | Status: DC
  Administered 2022-04-03: 40 mg via ORAL
  Filled 2022-04-03 (×2): qty 1

## 2022-04-03 MED ORDER — TRAMADOL HCL 50 MG PO TABS: 50.0000 mg | ORAL_TABLET | Freq: Three times a day (TID) | ORAL | Status: DC | PRN

## 2022-04-03 MED ORDER — LEVETIRACETAM 500 MG PO TABS
1500.0000 mg | ORAL_TABLET | Freq: Once | ORAL | Status: AC
  Administered 2022-04-03: 1500 mg via ORAL
  Filled 2022-04-03: qty 3

## 2022-04-03 MED ORDER — FENTANYL CITRATE PF 50 MCG/ML IJ SOSY
25.0000 ug | PREFILLED_SYRINGE | Freq: Once | INTRAMUSCULAR | Status: AC
  Administered 2022-04-03: 25 ug via INTRAVENOUS
  Filled 2022-04-03: qty 1
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
## 2022-04-03 NOTE — ED Notes (Signed)
EEG Team at bedside
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
## 2022-04-03 NOTE — Procedures (Signed)
Patient Name: Corey Chang  MRN: [id number]  Epilepsy Attending: Lora Havens  Referring Physician/Provider: Shela Leff, MD Date: 04/03/2022 Duration: 28.42 mins  Patient history: 41yo M with syncope. EEG to evaluate for seizure  Level of alertness: Awake, asleep  AEDs during EEG study: LEV, LCM  Technical aspects: This EEG study was done with scalp electrodes positioned according to the 10-20 International system of electrode placement. Electrical activity was reviewed with band pass filter of 1-'70Hz'$ , sensitivity of 7 uV/mm, display speed of 20m/sec with a '60Hz'$  notched filter applied as appropriate. EEG data were recorded continuously and digitally stored.  Video monitoring was available and reviewed as appropriate.  Description: The posterior dominant rhythm consists of 8-9 Hz activity of moderate voltage (25-35 uV) seen predominantly in posterior head regions, symmetric and reactive to eye opening and eye closing. Sleep was characterized by vertex waves, sleep spindles (12 to 14 Hz), maximal frontocentral region. Physiologic photic driving was seen during photic stimulation.  Hyperventilation was not performed.     IMPRESSION: This study is within normal limits. No seizures or epileptiform discharges were seen throughout the recording.  A normal interictal EEG does not exclude nor support the diagnosis of epilepsy.   Corey Chang

## 2022-04-03 NOTE — ED Notes (Signed)
Admitting MD at bedside.
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
## 2022-04-03 NOTE — Progress Notes (Signed)
EEG complete - results pending 
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
## 2022-04-03 NOTE — Progress Notes (Signed)
  Echocardiogram 2D Echocardiogram has been performed.  Merrie Roof F 04/03/2022, 1:18 PM

## 2022-04-03 NOTE — ED Notes (Signed)
Echo at bedside
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
## 2022-04-04 ENCOUNTER — Observation Stay (HOSPITAL_COMMUNITY): Payer: BC Managed Care – PPO

## 2022-04-04 DIAGNOSIS — K219 Gastro-esophageal reflux disease without esophagitis: Secondary | ICD-10-CM | POA: Diagnosis not present

## 2022-04-04 DIAGNOSIS — M87052 Idiopathic aseptic necrosis of left femur: Secondary | ICD-10-CM | POA: Diagnosis not present

## 2022-04-04 LAB — COMPREHENSIVE METABOLIC PANEL
ALT: 54 U/L — ABNORMAL HIGH (ref 0–44)
AST: 99 U/L — ABNORMAL HIGH (ref 15–41)
Albumin: 3.7 g/dL (ref 3.5–5.0)
Alkaline Phosphatase: 52 U/L (ref 38–126)
Anion gap: 9 (ref 5–15)
BUN: 6 mg/dL (ref 6–20)
CO2: 24 mmol/L (ref 22–32)
Calcium: 9.2 mg/dL (ref 8.9–10.3)
Chloride: 102 mmol/L (ref 98–111)
Creatinine, Ser: 0.88 mg/dL (ref 0.61–1.24)
GFR, Estimated: >60 mL/min (ref >60)
Glucose, Bld: 114 mg/dL — ABNORMAL HIGH (ref 70–99)
Potassium: 3.5 mmol/L (ref 3.5–5.1)
Sodium: 135 mmol/L (ref 135–145)
Total Bilirubin: 1.1 mg/dL (ref 0.3–1.2)
Total Protein: 6.5 g/dL (ref 6.5–8.1)

## 2022-04-04 LAB — CBC WITH DIFFERENTIAL/PLATELET
Abs Immature Granulocytes: 0.01 10*3/uL (ref 0.00–0.07)
Basophils Absolute: 0.1 10*3/uL (ref 0.0–0.1)
Basophils Relative: 1 %
Eosinophils Absolute: 0.2 10*3/uL (ref 0.0–0.5)
Eosinophils Relative: 3 %
HCT: 43.6 % (ref 39.0–52.0)
Hemoglobin: 15.1 g/dL (ref 13.0–17.0)
Immature Granulocytes: 0 %
Lymphocytes Relative: 31 %
Lymphs Abs: 1.8 10*3/uL (ref 0.7–4.0)
MCH: 30.9 pg (ref 26.0–34.0)
MCHC: 34.6 g/dL (ref 30.0–36.0)
MCV: 89.2 fL (ref 80.0–100.0)
Monocytes Absolute: 0.8 10*3/uL (ref 0.1–1.0)
Monocytes Relative: 15 %
Neutro Abs: 2.9 10*3/uL (ref 1.7–7.7)
Neutrophils Relative %: 50 %
Platelets: 204 10*3/uL (ref 150–400)
RBC: 4.89 MIL/uL (ref 4.22–5.81)
RDW: 13.2 % (ref 11.5–15.5)
WBC: 5.7 10*3/uL (ref 4.0–10.5)
nRBC: 0 % (ref 0.0–0.2)

## 2022-04-04 LAB — MAGNESIUM: Magnesium: 2.1 mg/dL (ref 1.7–2.4)

## 2022-04-04 LAB — CK: Total CK: 6549 U/L — ABNORMAL HIGH (ref 49–397)

## 2022-04-04 MED ORDER — POTASSIUM CHLORIDE CRYS ER 20 MEQ PO TBCR
40.0000 meq | EXTENDED_RELEASE_TABLET | Freq: Once | ORAL | Status: AC
  Administered 2022-04-04: 40 meq via ORAL
  Filled 2022-04-04: qty 2

## 2022-04-04 MED ORDER — LEVETIRACETAM 500 MG PO TABS: 1500.0000 mg | ORAL_TABLET | Freq: Two times a day (BID) | ORAL | 0 refills | Status: DC

## 2022-04-04 MED ORDER — LACTATED RINGERS IV SOLN: INTRAVENOUS | Status: AC

## 2022-04-04 MED ORDER — DILTIAZEM HCL 60 MG PO TABS: 60.0000 mg | ORAL_TABLET | Freq: Three times a day (TID) | ORAL | Status: DC

## 2022-04-04 MED ORDER — TRIPLE ANTIBIOTIC 3.5-400-5000 EX OINT: 1.0000 | TOPICAL_OINTMENT | Freq: Two times a day (BID) | CUTANEOUS | 0 refills | Status: DC

## 2022-04-04 MED ORDER — LAMOTRIGINE ER 50 MG PO TB24: 150.0000 mg | ORAL_TABLET | Freq: Every day | ORAL | 0 refills | Status: DC

## 2022-04-04 MED ORDER — ROSUVASTATIN CALCIUM 20 MG PO TABS: 20.0000 mg | ORAL_TABLET | Freq: Every day | ORAL | 0 refills | Status: DC

## 2022-04-04 MED ORDER — TRAMADOL HCL 50 MG PO TABS: 50.0000 mg | ORAL_TABLET | Freq: Three times a day (TID) | ORAL | 0 refills | Status: DC | PRN

## 2022-04-04 NOTE — Discharge Instructions (Addendum)
Do not drive, operate heavy machinery, perform activities at heights, swimming or participation in water activities or provide baby sitting services  until you have seen by Primary MD or a Neurologist and advised to do so again.  Keep your laceration sites clean and dry.  Get your left knee staples removed in 2 weeks time by your PCP.    Follow with Primary MD Lesleigh Noe, MD in 7 days   Get CBC, CMP, 2 view Chest X ray -  checked next visit within 1 week by Primary MD    Activity: L. Leg weight bearing tolerated with Full fall precautions use walker/cane & assistance as needed  Disposition Home   Diet: Heart Healthy   Special Instructions: If you have smoked or chewed Tobacco  in the last 2 yrs please stop smoking, stop any regular Alcohol  and or any Recreational drug use.  On your next visit with your primary care physician please Get Medicines reviewed and adjusted.  Please request your Prim.MD to go over all Hospital Tests and Procedure/Radiological results at the follow up, please get all Hospital records sent to your Prim MD by signing hospital release before you go home.  If you experience worsening of your admission symptoms, develop shortness of breath, life threatening emergency, suicidal or homicidal thoughts you must seek medical attention immediately by calling 911 or calling your MD immediately  if symptoms less severe.  You Must read complete instructions/literature along with all the possible adverse reactions/side effects for all the Medicines you take and that have been prescribed to you. Take any new Medicines after you have completely understood and accpet all the possible adverse reactions/side effects.   Do not drive when taking Pain medications.  Do not take more than prescribed Pain, Sleep and Anxiety Medications  Wear Seat belts while driving.   Please note  You were cared for by a hospitalist during your hospital stay. If you have any questions about your  discharge medications or the care you received while you were in the hospital after you are discharged, you can call the unit and asked to speak with the hospitalist on call if the hospitalist that took care of you is not available. Once you are discharged, your primary care physician will handle any further medical issues. Please note that NO REFILLS for any discharge medications will be authorized once you are discharged, as it is imperative that you return to your primary care physician (or establish a relationship with a primary care physician if you do not have one) for your aftercare needs so that they can reassess your need for medications and monitor your lab values.

## 2022-04-04 NOTE — Plan of Care (Signed)
                                      [facility]                            [address] [place], [place] [address]      Morgan Rennert was admitted to the Hospital on 04/02/2022 and Discharged  04/04/2022 and should be excused from work/school   for 14 days starting from date -  04/02/2022 , may return to work/school without any restrictions.  Call Lala Lund MD, [facility]  [phone] with questions.  Lala Lund M.D on 04/04/2022,at 11:09 AM  [facility]   Office  [phone]
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
## 2022-04-04 NOTE — TOC Transition Note (Signed)
Transition of Care (TOC) - CM/SW Discharge Note Marvetta Gibbons RN, BSN Transitions of Care Unit 4E- RN Case Manager See Treatment Team for direct phone #  Weekend Cross Coverage  Patient Details  Name: Corey Chang MRN: [id number] Date of Birth: 10/31/1980  Transition of Care [facility]) CM/SW Contact:  Dawayne Patricia, RN Phone Number: 04/04/2022, 11:54 AM   Clinical Narrative:    Pt from home, stable for transition home today. Orders placed for [facility] and DME needs.  PT at bedside on arrival- recs for outpt not HH- discussed with pt and wife at bedside- pt states he would prefer to f/u with Ortho MD as per discussion with PT here and do outpt therapy services- declines [place] services in favor of outpt per Ortho.  Pt plans to call Ortho tomorrow for f/u appointment.   DME- RW ordered and pt agreeable to in house provider- [employer] called for DME- RW to be delivered to room prior to discharge to take home.   Meds have been sent to [facility]- reviewed with pt and wife- no anticipated barriers to get meds.   Wife to transport home later this afternoon, once pt receives IVF ordered.   RNCM will sign off for now as intervention is no longer needed. Please re-consult  if new needs arise, or contact RNCM assigned to treatment team for further questions/concerns.      Final next level of care: OP Rehab Barriers to Discharge: No Barriers Identified   Patient Goals and CMS Choice Patient states their goals for this hospitalization and ongoing recovery are:: return home CMS Medicare.gov Compare Post Acute Care list provided to:: Patient Choice offered to / list presented to : Patient, Spouse  Discharge Placement                 Home      Discharge Plan and Services   Discharge Planning Services: CM Consult Post Acute Care Choice: Durable Medical Equipment, Home Health          DME Arranged: Walker rolling DME Agency: [employer] Date DME Agency Contacted: 04/04/22 Time DME  Agency Contacted: 1120 Representative spoke with at DME Agency: Mardene Celeste HH Arranged: PT, OT, Patient Refused [facility] (Pt wants to f/u with Ortho MD on outpt therapy) [place] Agency: NA        Social Determinants of Health (SDOH) Interventions     Readmission Risk Interventions     No data to display
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
## 2022-04-05 ENCOUNTER — Other Ambulatory Visit: Payer: Self-pay | Admitting: Cardiology

## 2022-04-05 ENCOUNTER — Telehealth: Payer: Self-pay

## 2022-04-05 DIAGNOSIS — R55 Syncope and collapse: Secondary | ICD-10-CM

## 2022-04-05 LAB — LIPOPROTEIN A (LPA): Lipoprotein (a): 17 nmol/L (ref <75.0)

## 2022-04-05 NOTE — Telephone Encounter (Signed)
Transition Care Management Follow-up Telephone Call Date of discharge and from where: [facility] 04-04-22 Dx: Syncope How have you been since you were released from the hospital? Doing best I can  Any questions or concerns? No  Items Reviewed: Did the pt receive and understand the discharge instructions provided? Yes  Medications obtained and verified? Yes  Other? No  Any new allergies since your discharge? No  Dietary orders reviewed? Yes Do you have support at home? Yes   Home Care and Equipment/Supplies: Were home health services ordered? no If so, what is the name of the agency? na  Has the agency set up a time to come to the patient's home? not applicable Were any new equipment or medical supplies ordered?  Yes: Rolling walker  What is the name of the medical supply agency? Hospital  Were you able to get the supplies/equipment? yes Do you have any questions related to the use of the equipment or supplies? No  Functional Questionnaire: (I = Independent and D = Dependent) ADLs: I  Bathing/Dressing- I  Meal Prep- I  Eating- I  Maintaining continence- I  Transferring/Ambulation- I  Managing Meds- I  Follow up appointments reviewed:  PCP Hospital f/u appt confirmed? Yes  Scheduled to see Dr Einar Pheasant  on 04-12-22 @ noon. [facility] f/u appt confirmed? No  pt waiting to hear back from neurology . Are transportation arrangements needed? No  If their condition worsens, is the pt aware to call PCP or go to the Emergency Dept.? Yes Was the patient provided with contact information for the PCP's office or ED? Yes Was to pt encouraged to call back with questions or concerns? Yes

## 2022-04-06 NOTE — Telephone Encounter (Signed)
Noted  

## 2022-04-09 ENCOUNTER — Ambulatory Visit: Payer: BC Managed Care – PPO | Attending: Cardiology

## 2022-04-09 DIAGNOSIS — R55 Syncope and collapse: Secondary | ICD-10-CM | POA: Diagnosis not present

## 2022-04-12 ENCOUNTER — Ambulatory Visit (INDEPENDENT_AMBULATORY_CARE_PROVIDER_SITE_OTHER)
Admission: RE | Admit: 2022-04-12 | Discharge: 2022-04-12 | Disposition: A | Discharge status: Home / Self Care | Payer: BC Managed Care – PPO | Source: Ambulatory Visit | Attending: Family Medicine | Admitting: Family Medicine

## 2022-04-12 ENCOUNTER — Ambulatory Visit (INDEPENDENT_AMBULATORY_CARE_PROVIDER_SITE_OTHER): Payer: BC Managed Care – PPO | Admitting: Family Medicine

## 2022-04-12 VITALS — BP 102/62 | HR 104 | Temp 97.7°F | Ht 71.0 in | Wt 195.4 lb

## 2022-04-12 DIAGNOSIS — R051 Acute cough: Secondary | ICD-10-CM

## 2022-04-12 DIAGNOSIS — G40219 Localization-related (focal) (partial) symptomatic epilepsy and epileptic syndromes with complex partial seizures, intractable, without status epilepticus: Secondary | ICD-10-CM

## 2022-04-12 DIAGNOSIS — R7989 Other specified abnormal findings of blood chemistry: Secondary | ICD-10-CM | POA: Diagnosis not present

## 2022-04-12 DIAGNOSIS — Z20822 Contact with and (suspected) exposure to covid-19: Secondary | ICD-10-CM

## 2022-04-12 DIAGNOSIS — E782 Mixed hyperlipidemia: Secondary | ICD-10-CM

## 2022-04-12 DIAGNOSIS — M87052 Idiopathic aseptic necrosis of left femur: Secondary | ICD-10-CM | POA: Diagnosis not present

## 2022-04-12 DIAGNOSIS — E785 Hyperlipidemia, unspecified: Secondary | ICD-10-CM | POA: Insufficient documentation

## 2022-04-12 DIAGNOSIS — R55 Syncope and collapse: Secondary | ICD-10-CM | POA: Diagnosis not present

## 2022-04-12 DIAGNOSIS — S81012D Laceration without foreign body, left knee, subsequent encounter: Secondary | ICD-10-CM

## 2022-04-12 LAB — COMPREHENSIVE METABOLIC PANEL
ALT: 44 U/L (ref 0–53)
AST: 42 U/L — ABNORMAL HIGH (ref 0–37)
Albumin: 4.7 g/dL (ref 3.5–5.2)
Alkaline Phosphatase: 89 U/L (ref 39–117)
BUN: 9 mg/dL (ref 6–23)
CO2: 27 mEq/L (ref 19–32)
Calcium: 10.3 mg/dL (ref 8.4–10.5)
Chloride: 95 mEq/L — ABNORMAL LOW (ref 96–112)
Creatinine, Ser: 0.92 mg/dL (ref 0.40–1.50)
GFR: 104.07 mL/min (ref >60.00)
Glucose, Bld: 84 mg/dL (ref 70–99)
Potassium: 3.8 mEq/L (ref 3.5–5.1)
Sodium: 133 mEq/L — ABNORMAL LOW (ref 135–145)
Total Bilirubin: 0.8 mg/dL (ref 0.2–1.2)
Total Protein: 8 g/dL (ref 6.0–8.3)

## 2022-04-12 LAB — POC COVID19 BINAXNOW: SARS Coronavirus 2 Ag: NEGATIVE

## 2022-04-12 MED ORDER — ALBUTEROL SULFATE HFA 108 (90 BASE) MCG/ACT IN AERS: 2.0000 | INHALATION_SPRAY | Freq: Four times a day (QID) | RESPIRATORY_TRACT | 2 refills | Status: AC | PRN

## 2022-04-12 MED ORDER — LAMOTRIGINE ER 50 MG PO TB24: 150.0000 mg | ORAL_TABLET | Freq: Every day | ORAL | 1 refills | Status: DC
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
## 2022-04-12 NOTE — Assessment & Plan Note (Signed)
ASCVD >7.5%. on crestor 20 mg. Continue and recheck in 3-6 months

## 2022-04-12 NOTE — Assessment & Plan Note (Signed)
Wearing monitoring. Troponin elevated in the hospital. Will f/u holter results. Appreciate cardiology support.
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
## 2022-04-12 NOTE — Assessment & Plan Note (Signed)
Suspect viral process flaring some underlying lung changes.  Albuterol every 4-6 hours for the next 1 to 2 days.  COVID testing negative chest x-ray without signs of pneumonia we will follow-up final read.  If no improvement consider short course of steroids given wheezing on lung exam.

## 2022-04-12 NOTE — Assessment & Plan Note (Signed)
Saw ortho - no pain - monitor and f/u prn

## 2022-04-12 NOTE — Patient Instructions (Signed)
Increase Lamictal as discussed  Chest X-ray today
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
## 2022-04-13 ENCOUNTER — Telehealth: Payer: Self-pay

## 2022-04-13 NOTE — Telephone Encounter (Signed)
Patient came in to sign form.  Copy was made for patient which he will turn in to employer.

## 2022-04-13 NOTE — Telephone Encounter (Signed)
FMLA form that was dropped off by patient has been completed.  Copies made for scanning, patient, and myself.  I left a vmail for patient to call back.  Form states not to return it to [place] [employer], but to give to patient to turn in.  There is also one form that patient still needs to sign.  I need to know if patient wants to pick up form or have it mailed out to address on system.  The paperwork is at my desk until I hear back from patient.

## 2022-04-14 ENCOUNTER — Telehealth: Payer: Self-pay | Admitting: Family Medicine

## 2022-04-14 NOTE — Telephone Encounter (Signed)
Paperwork placed in providers office inbox

## 2022-04-14 NOTE — Telephone Encounter (Signed)
Corey Chang came in and dropped off a return to work form for Dr Einar Pheasant to fill out and sig. Placed in Dr Einar Pheasant folder up front. He would like a phone call once completed [phone].

## 2022-04-15 ENCOUNTER — Telehealth: Payer: Self-pay

## 2022-04-15 NOTE — Telephone Encounter (Signed)
Message started in error.

## 2022-04-15 NOTE — Telephone Encounter (Signed)
Patient dropped of Return to Work form for completion.  This form must be turned in to employer by patient.  Copies have been made for patient, scanning, and myself.  I called patient to notify him that his copy is at the front office for pickup when it is convenient for him and that he must turn it in to his employer.

## 2022-04-15 NOTE — Telephone Encounter (Signed)
Completed and placed in box 

## 2022-04-15 NOTE — Telephone Encounter (Signed)
Left VM for pt notifying him that form is ready for pickup

## 2022-05-27 ENCOUNTER — Telehealth: Payer: Self-pay | Admitting: Cardiology

## 2022-05-27 NOTE — Telephone Encounter (Signed)
Informed patient of results and verbal understanding expressed.  

## 2022-05-27 NOTE — Telephone Encounter (Signed)
Patient is returning call to discuss monitor results. 

## 2022-10-21 ENCOUNTER — Other Ambulatory Visit: Payer: Self-pay | Admitting: Neurology

## 2022-11-15 ENCOUNTER — Encounter: Payer: Self-pay | Admitting: Neurology

## 2022-11-15 ENCOUNTER — Other Ambulatory Visit (INDEPENDENT_AMBULATORY_CARE_PROVIDER_SITE_OTHER): Payer: BC Managed Care – PPO

## 2022-11-15 ENCOUNTER — Ambulatory Visit: Payer: BC Managed Care – PPO | Admitting: Neurology

## 2022-11-15 VITALS — BP 119/86 | HR 78 | Ht 71.0 in | Wt 201.0 lb

## 2022-11-15 DIAGNOSIS — G40219 Localization-related (focal) (partial) symptomatic epilepsy and epileptic syndromes with complex partial seizures, intractable, without status epilepticus: Secondary | ICD-10-CM | POA: Diagnosis not present

## 2022-11-15 DIAGNOSIS — C712 Malignant neoplasm of temporal lobe: Secondary | ICD-10-CM

## 2022-11-15 MED ORDER — LEVETIRACETAM 500 MG PO TABS: 1500.0000 mg | ORAL_TABLET | Freq: Two times a day (BID) | ORAL | 3 refills | Status: DC
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
## 2022-11-15 NOTE — Patient Instructions (Signed)
Good to see you.   Have bloodwork done for Lamictal and Keppra levels  2. Schedule MRI brain with and without contrast  3. Schedule 2-day home EEG  4. Continue Keppra XR 500mg : take 3 tablet twice a day and Lamictal XR 50mg : 3 tablets daily. Depending on results, we will call and let you know Lamictal dose moving forward.  5. Follow-up in 3 months, call for any changes   Seizure Precautions: 1. If medication has been prescribed for you to prevent seizures, take it exactly as directed.  Do not stop taking the medicine without talking to your doctor first, even if you have not had a seizure in a long time.   2. Avoid activities in which a seizure would cause danger to yourself or to others.  Don't operate dangerous machinery, swim alone, or climb in high or dangerous places, such as on ladders, roofs, or girders.  Do not drive unless your doctor says you may.  3. If you have any warning that you may have a seizure, lay down in a safe place where you can't hurt yourself.    4.  No driving for 6 months from last seizure, as per [facility].   Please refer to the following link on the [employer]ile[employer] of [employer]'s website for more information: http://www.epilepsyfoundation.org/answerplace/Social/driving/drivingu.cfm   5.  Maintain good sleep hygiene. Avoid alcohol.  6.  Contact your doctor if you have any problems that may be related to the medicine you are taking.  7.  Call 911 and bring the patient back to the ED if:        A.  The seizure lasts longer than 5 minutes.       B.  The patient doesn't awaken shortly after the seizure  C.  The patient has new problems such as difficulty seeing, speaking or moving  D.  The patient was injured during the seizure  E.  The patient has a temperature over 102 F (39C)  F.  The patient vomited and now is having trouble breathing

## 2022-11-18 ENCOUNTER — Telehealth: Payer: Self-pay

## 2022-11-18 MED ORDER — LAMOTRIGINE ER 100 MG PO TB24: ORAL_TABLET | ORAL | 3 refills | Status: DC

## 2022-11-18 NOTE — Telephone Encounter (Signed)
-----   Message from Van Clines, MD sent at 11/18/2022 10:05 AM EDT ----- Pls let him know Lamictal level is low, I would like to increase to Lamotrigine ER 100mg : Take 2 tablets every night. Rx sent to pharmacy. Continue Keppra. Thanks

## 2022-11-18 NOTE — Telephone Encounter (Signed)
Pt called an informed that Lamictal level is low, I would like to increase to Lamotrigine ER 100mg : Take 2 tablets every night. Rx sent to pharmacy. Continue Keppra

## 2022-11-19 LAB — LEVETIRACETAM LEVEL: Keppra (Levetiracetam): 26.4 ug/mL

## 2022-11-19 LAB — LAMOTRIGINE LEVEL: Lamotrigine Lvl: 1.9 ug/mL — ABNORMAL LOW (ref 2.5–15.0)

## 2022-11-23 ENCOUNTER — Other Ambulatory Visit: Payer: Self-pay

## 2022-11-23 ENCOUNTER — Encounter: Payer: Self-pay | Admitting: Emergency Medicine

## 2022-11-23 ENCOUNTER — Telehealth: Payer: Self-pay

## 2022-11-23 ENCOUNTER — Emergency Department: Payer: BC Managed Care – PPO

## 2022-11-23 ENCOUNTER — Observation Stay: Payer: BC Managed Care – PPO

## 2022-11-23 ENCOUNTER — Ambulatory Visit: Admit: 2022-11-23 | Payer: BC Managed Care – PPO

## 2022-11-23 ENCOUNTER — Inpatient Hospital Stay
Admission: EM | Admit: 2022-11-23 | Discharge: 2022-11-26 | DRG: 065 | Disposition: A | Discharge status: Rehabilitation Facility | Payer: BC Managed Care – PPO | Attending: Internal Medicine | Admitting: Internal Medicine

## 2022-11-23 DIAGNOSIS — Z833 Family history of diabetes mellitus: Secondary | ICD-10-CM

## 2022-11-23 DIAGNOSIS — E785 Hyperlipidemia, unspecified: Secondary | ICD-10-CM | POA: Diagnosis present

## 2022-11-23 DIAGNOSIS — I10 Essential (primary) hypertension: Secondary | ICD-10-CM | POA: Diagnosis present

## 2022-11-23 DIAGNOSIS — E782 Mixed hyperlipidemia: Secondary | ICD-10-CM | POA: Diagnosis present

## 2022-11-23 DIAGNOSIS — Z1152 Encounter for screening for COVID-19: Secondary | ICD-10-CM

## 2022-11-23 DIAGNOSIS — I639 Cerebral infarction, unspecified: Principal | ICD-10-CM

## 2022-11-23 DIAGNOSIS — Z882 Allergy status to sulfonamides status: Secondary | ICD-10-CM

## 2022-11-23 DIAGNOSIS — Z820 Family history of epilepsy and other diseases of the nervous system: Secondary | ICD-10-CM

## 2022-11-23 DIAGNOSIS — G40909 Epilepsy, unspecified, not intractable, without status epilepticus: Secondary | ICD-10-CM | POA: Diagnosis present

## 2022-11-23 DIAGNOSIS — E871 Hypo-osmolality and hyponatremia: Secondary | ICD-10-CM | POA: Diagnosis present

## 2022-11-23 DIAGNOSIS — R29705 NIHSS score 5: Secondary | ICD-10-CM | POA: Diagnosis present

## 2022-11-23 DIAGNOSIS — I6389 Other cerebral infarction: Secondary | ICD-10-CM | POA: Diagnosis not present

## 2022-11-23 DIAGNOSIS — Z85841 Personal history of malignant neoplasm of brain: Secondary | ICD-10-CM

## 2022-11-23 DIAGNOSIS — C712 Malignant neoplasm of temporal lobe: Secondary | ICD-10-CM | POA: Diagnosis present

## 2022-11-23 DIAGNOSIS — Z87891 Personal history of nicotine dependence: Secondary | ICD-10-CM

## 2022-11-23 DIAGNOSIS — Z79899 Other long term (current) drug therapy: Secondary | ICD-10-CM

## 2022-11-23 DIAGNOSIS — Z923 Personal history of irradiation: Secondary | ICD-10-CM

## 2022-11-23 DIAGNOSIS — R4781 Slurred speech: Secondary | ICD-10-CM | POA: Diagnosis present

## 2022-11-23 DIAGNOSIS — D751 Secondary polycythemia: Secondary | ICD-10-CM | POA: Diagnosis present

## 2022-11-23 DIAGNOSIS — Z8249 Family history of ischemic heart disease and other diseases of the circulatory system: Secondary | ICD-10-CM

## 2022-11-23 DIAGNOSIS — E860 Dehydration: Secondary | ICD-10-CM | POA: Diagnosis present

## 2022-11-23 HISTORY — DX: Cerebral infarction, unspecified: I63.9

## 2022-11-23 HISTORY — DX: Calculus of gallbladder without cholecystitis without obstruction: K80.20

## 2022-11-23 LAB — CBG MONITORING, ED: Glucose-Capillary: 98 mg/dL (ref 70–99)

## 2022-11-23 LAB — COMPREHENSIVE METABOLIC PANEL
ALT: 34 U/L (ref 0–44)
AST: 30 U/L (ref 15–41)
Albumin: 4.7 g/dL (ref 3.5–5.0)
Alkaline Phosphatase: 77 U/L (ref 38–126)
Anion gap: 12 (ref 5–15)
BUN: 11 mg/dL (ref 6–20)
CO2: 21 mmol/L — ABNORMAL LOW (ref 22–32)
Calcium: 9.8 mg/dL (ref 8.9–10.3)
Chloride: 100 mmol/L (ref 98–111)
Creatinine, Ser: 0.92 mg/dL (ref 0.61–1.24)
GFR, Estimated: >60 mL/min (ref >60)
Glucose, Bld: 107 mg/dL — ABNORMAL HIGH (ref 70–99)
Potassium: 3.7 mmol/L (ref 3.5–5.1)
Sodium: 133 mmol/L — ABNORMAL LOW (ref 135–145)
Total Bilirubin: 1 mg/dL (ref 0.3–1.2)
Total Protein: 7.7 g/dL (ref 6.5–8.1)

## 2022-11-23 LAB — CBC
HCT: 52.9 % — ABNORMAL HIGH (ref 39.0–52.0)
Hemoglobin: 17.9 g/dL — ABNORMAL HIGH (ref 13.0–17.0)
MCH: 30 pg (ref 26.0–34.0)
MCHC: 33.8 g/dL (ref 30.0–36.0)
MCV: 88.6 fL (ref 80.0–100.0)
Platelets: 267 10*3/uL (ref 150–400)
RBC: 5.97 MIL/uL — ABNORMAL HIGH (ref 4.22–5.81)
RDW: 13.3 % (ref 11.5–15.5)
WBC: 5.9 10*3/uL (ref 4.0–10.5)
nRBC: 0 % (ref 0.0–0.2)

## 2022-11-23 LAB — DIFFERENTIAL
Abs Immature Granulocytes: 0.01 10*3/uL (ref 0.00–0.07)
Basophils Absolute: 0.1 10*3/uL (ref 0.0–0.1)
Basophils Relative: 1 %
Eosinophils Absolute: 0.1 10*3/uL (ref 0.0–0.5)
Eosinophils Relative: 2 %
Immature Granulocytes: 0 %
Lymphocytes Relative: 35 %
Lymphs Abs: 2.1 10*3/uL (ref 0.7–4.0)
Monocytes Absolute: 0.4 10*3/uL (ref 0.1–1.0)
Monocytes Relative: 7 %
Neutro Abs: 3.2 10*3/uL (ref 1.7–7.7)
Neutrophils Relative %: 55 %

## 2022-11-23 LAB — HEMOGLOBIN A1C
Hgb A1c MFr Bld: 5.1 % (ref 4.8–5.6)
Mean Plasma Glucose: 99.67 mg/dL

## 2022-11-23 LAB — LIPID PANEL
Cholesterol: 278 mg/dL — ABNORMAL HIGH (ref 0–200)
HDL: 35 mg/dL — ABNORMAL LOW (ref >40)
LDL Cholesterol: 192 mg/dL — ABNORMAL HIGH (ref 0–99)
Total CHOL/HDL Ratio: 7.9 RATIO
Triglycerides: 254 mg/dL — ABNORMAL HIGH (ref <150)
VLDL: 51 mg/dL — ABNORMAL HIGH (ref 0–40)

## 2022-11-23 LAB — ETHANOL: Alcohol, Ethyl (B): <10 mg/dL (ref <10)

## 2022-11-23 MED ORDER — LEVETIRACETAM 500 MG PO TABS
1500.0000 mg | ORAL_TABLET | Freq: Two times a day (BID) | ORAL | Status: DC
  Administered 2022-11-23 – 2022-11-26 (×6): 1500 mg via ORAL
  Filled 2022-11-23 (×6): qty 3

## 2022-11-23 MED ORDER — STROKE: EARLY STAGES OF RECOVERY BOOK: Freq: Once | Status: AC

## 2022-11-23 MED ORDER — ACETAMINOPHEN 325 MG PO TABS: 650.0000 mg | ORAL_TABLET | ORAL | Status: DC | PRN

## 2022-11-23 MED ORDER — CLOPIDOGREL BISULFATE 75 MG PO TABS: 300.0000 mg | ORAL_TABLET | Freq: Once | ORAL | Status: DC

## 2022-11-23 MED ORDER — ACETAMINOPHEN 160 MG/5ML PO SOLN: 650.0000 mg | ORAL | Status: DC | PRN

## 2022-11-23 MED ORDER — IOHEXOL 350 MG/ML SOLN
75.0000 mL | Freq: Once | INTRAVENOUS | Status: AC | PRN
  Administered 2022-11-23: 75 mL via INTRAVENOUS

## 2022-11-23 MED ORDER — CLOPIDOGREL BISULFATE 75 MG PO TABS
150.0000 mg | ORAL_TABLET | Freq: Once | ORAL | Status: AC
  Administered 2022-11-23: 150 mg via ORAL
  Filled 2022-11-23: qty 2

## 2022-11-23 MED ORDER — GADOBUTROL 1 MMOL/ML IV SOLN
10.0000 mL | Freq: Once | INTRAVENOUS | Status: AC | PRN
  Administered 2022-11-23: 10 mL via INTRAVENOUS

## 2022-11-23 MED ORDER — SODIUM CHLORIDE 0.9 % IV BOLUS
1000.0000 mL | Freq: Once | INTRAVENOUS | Status: AC
  Administered 2022-11-23: 1000 mL via INTRAVENOUS

## 2022-11-23 MED ORDER — SODIUM CHLORIDE 0.9% FLUSH
3.0000 mL | Freq: Once | INTRAVENOUS | Status: AC
  Administered 2022-11-23: 3 mL via INTRAVENOUS

## 2022-11-23 MED ORDER — ATORVASTATIN CALCIUM 20 MG PO TABS
80.0000 mg | ORAL_TABLET | Freq: Every day | ORAL | Status: DC
  Administered 2022-11-23 – 2022-11-25 (×3): 80 mg via ORAL
  Filled 2022-11-23 (×3): qty 4

## 2022-11-23 MED ORDER — ASPIRIN 81 MG PO CHEW
324.0000 mg | CHEWABLE_TABLET | Freq: Once | ORAL | Status: AC
  Administered 2022-11-23: 324 mg via ORAL
  Filled 2022-11-23: qty 4

## 2022-11-23 MED ORDER — SENNOSIDES-DOCUSATE SODIUM 8.6-50 MG PO TABS: 1.0000 | ORAL_TABLET | Freq: Every evening | ORAL | Status: DC | PRN

## 2022-11-23 MED ORDER — ASPIRIN 81 MG PO TBEC
81.0000 mg | DELAYED_RELEASE_TABLET | Freq: Every day | ORAL | Status: DC
  Administered 2022-11-24 – 2022-11-26 (×3): 81 mg via ORAL
  Filled 2022-11-23 (×3): qty 1

## 2022-11-23 MED ORDER — LAMOTRIGINE ER 100 MG PO TB24
200.0000 mg | ORAL_TABLET | Freq: Every evening | ORAL | Status: DC
  Administered 2022-11-23 – 2022-11-25 (×3): 200 mg via ORAL
  Filled 2022-11-23 (×4): qty 2

## 2022-11-23 MED ORDER — ACETAMINOPHEN 650 MG RE SUPP: 650.0000 mg | RECTAL | Status: DC | PRN

## 2022-11-23 MED ORDER — CLOPIDOGREL BISULFATE 75 MG PO TABS
75.0000 mg | ORAL_TABLET | Freq: Every day | ORAL | Status: DC
  Administered 2022-11-24 – 2022-11-26 (×3): 75 mg via ORAL
  Filled 2022-11-23 (×3): qty 1

## 2022-11-23 MED ORDER — ENOXAPARIN SODIUM 40 MG/0.4ML IJ SOSY
40.0000 mg | PREFILLED_SYRINGE | INTRAMUSCULAR | Status: DC
  Administered 2022-11-23 – 2022-11-25 (×3): 40 mg via SUBCUTANEOUS
  Filled 2022-11-23 (×3): qty 0.4

## 2022-11-23 NOTE — H&P (Addendum)
History and Physical    Patient: Corey Chang UJW:[id number] DOB: 07-17-1981 DOA: 11/23/2022 DOS: the patient was seen and examined on 11/23/2022 PCP: Gweneth Dimitri, MD  Patient coming from: Home  Chief Complaint:  Chief Complaint  Patient presents with   Numbness   HPI: Corey Chang is a 42 y.o. male with medical history significant of oligodendroglioma s/p radiation, seizure disorder, hyperlipidemia, who presents to the ED due to left-sided numbness.  Mr. Golan states that yesterday morning when he awoke, he noticed that his left arm seemed numb with tingling and weaker than his right.  In addition, his left leg seemed weak.  He assumed that he had slept on both abnormal.  Then he developed sudden onset dizziness with nausea and vomiting.  He had several episodes of vomiting that were nonbloody, nonmelanotic. He continued to feel unwell throughout the day.  Then today, he notes that he was having difficulty with walking and had to hold onto something to not fall.  Due to this, he came to the ED.  He denies any fever, chills, palpitations, chest pain or shortness of breath.  He denies any prior knowledge of arrhythmias.  ED Course:  On arrival to the ED, patient was hypertensive at 136/100 with heart rate of 65.  He was saturating at 97% on room air.  He was afebrile 97.8.  Initial blood work demonstrated hemoglobin of 17.9, sodium of 133, bicarb 21, glucose 107, creatinine 0.92, and GFR above 60.  CT head was obtained with no acute intracranial abnormalities.  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  Neurology consulted.  Patient started on aspirin and Plavix.  TRH contacted for admission.  Review of Systems: As mentioned in the history of present illness. All other systems reviewed and are negative.  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  He had external beam radiation in August 2001 with 54 Gy in 30 fractions on RTOG 9802 protocol.   He declined chemotherapy.    Seizure (HCC)    from history of oligodendroglioma   Past Surgical History:  Procedure Laterality Date   ANKLE SURGERY     right, x 3   brain biopsy  1997, 2001   Social History:  reports that he quit smoking about 16 years ago. His smoking use included cigarettes. He smoked an average of .5 packs per day. His smokeless tobacco use includes snuff. He reports current alcohol use of about 2.0 standard drinks of alcohol per week. He reports that he does not use drugs.  Allergies  Allergen Reactions   Carbatrol [Carbamazepine]     Not sure but may have caused a rash   Sulfa Antibiotics Rash    Family History  Problem Relation Age of Onset   Alzheimer's disease Mother    Diabetes Father    Heart disease Maternal Grandfather        MI   Parkinsonism Paternal Grandmother    Alzheimer's disease Paternal Grandmother    Depression Neg Hx    Drug abuse Neg Hx    Alcohol abuse Neg Hx    Cancer Neg Hx        no colon, prostate, breast, uterine,ovarian    Prior to Admission medications   Medication Sig Start Date End Date  Taking? Authorizing Provider  albuterol (VENTOLIN HFA) 108 (90 Base) MCG/ACT inhaler Inhale 2 puffs into the lungs every 6 (six) hours as needed for wheezing or shortness of breath. 04/12/22   Gweneth Dimitri, MD  esomeprazole (NEXIUM) 40 MG capsule Take 40 mg by mouth daily at 12 noon.    [provider]  lamoTRIgine (LAMICTAL XR) 100 MG 24 hour tablet Take 2 tablets every night 11/18/22   Van Clines, MD  levETIRAcetam (KEPPRA) 500 MG tablet Take 3 tablets (1,500 mg total) by mouth 2 (two) times daily. 11/15/22   Van Clines, MD  loratadine (CLARITIN) 10 MG tablet Take 10 mg by mouth daily.    [provider]   Naproxen Sodium (ALEVE PO) Take 1 tablet by mouth 2 (two) times daily.     [provider]  rosuvastatin (CRESTOR) 20 MG tablet Take 1 tablet (20 mg total) by mouth daily. Patient not taking: Reported on 11/15/2022 04/05/22   Leroy Sea, MD    Physical Exam: Vitals:   11/23/22 1250 11/23/22 1701 11/23/22 1930  BP: (!) 136/100 (!) 136/94 (!) 147/106  Pulse: 65 66 (!) 59  Resp: 18 18 15   Temp: 97.8 F (36.6 C) 97.7 F (36.5 C) 98 F (36.7 C)  TempSrc: Oral Oral Oral  SpO2: 97% 98% 100%  Weight:   88.7 kg  Height:   5\' 11"  (1.803 m)   Physical Exam Vitals and nursing note reviewed.  Constitutional:      General: He is not in acute distress.    Appearance: He is normal weight. He is not toxic-appearing.  HENT:     Head: Normocephalic and atraumatic.     Mouth/Throat:     Mouth: Mucous membranes are moist.     Pharynx: Oropharynx is clear.  Eyes:     Extraocular Movements: Extraocular movements intact.     Conjunctiva/sclera: Conjunctivae normal.     Pupils: Pupils are equal, round, and reactive to light.  Cardiovascular:     Rate and Rhythm: Normal rate and regular rhythm.     Heart sounds: No murmur heard.    No gallop.  Pulmonary:     Effort: Pulmonary effort is normal. No respiratory distress.     Breath sounds: Normal breath sounds. No wheezing, rhonchi or rales.  Musculoskeletal:     Right lower leg: No edema.     Left lower leg: No edema.  Skin:    General: Skin is warm and dry.  Neurological:     Mental Status: He is alert.     Comments:  Patient is alert and oriented x 3 No facial asymmetry Sensation grossly intact throughout Right upper extremity strength 5 out of 5.  Left upper extremity strength 3 out of 5 distally, 4 out of 5 peripherally. Right lower extremity strength 5 out of 5.  Left lower extremity strength 4 out of 5 globally. Normal finger-to-nose on the right.  Left finger-to-nose notably abnormal Normal heel-to-shin  bilaterally Pronator drift on the left  Psychiatric:        Mood and Affect: Mood normal.        Behavior: Behavior normal.    Data Reviewed: CBC with WBC of 5.9, hemoglobin 17.9, MCV 88.6 and platelets of 267 CMP with sodium of 133, potassium 3.7, bicarb 21, glucose 107, BUN 11, creatinine 0.92, anion gap 12, AST 30, ALT 34, GFR above 60 Alcohol level negative  EKG personally reviewed.  Sinus rhythm with rate of 66.  No ischemic appearing ST changes.  CT ANGIO HEAD NECK W WO CM  Result Date: 11/23/2022 CLINICAL DATA:  Stroke suspected EXAM: CT ANGIOGRAPHY HEAD AND NECK WITH AND WITHOUT CONTRAST TECHNIQUE: Multidetector CT imaging of the head and neck was performed using the standard protocol during bolus administration of intravenous contrast. Multiplanar CT image reconstructions and MIPs were obtained to evaluate the vascular anatomy. Carotid stenosis measurements (when applicable) are obtained utilizing NASCET criteria, using the distal internal carotid diameter as the denominator. RADIATION DOSE REDUCTION: This exam was performed according to the departmental dose-optimization program which includes automated exposure control, adjustment of the mA and/or kV according to patient size and/or use of iterative reconstruction technique. CONTRAST:  75mL OMNIPAQUE IOHEXOL 350 MG/ML SOLN COMPARISON:  Same day brain MRI FINDINGS: CT HEAD FINDINGS See same day CT brain for intracranial findings. No contrast-enhancing lesion is visualized. CTA NECK FINDINGS Aortic arch: Standard branching. Imaged portion shows no evidence of aneurysm or dissection. No significant stenosis of the major arch vessel origins. Right carotid system: No evidence of dissection, stenosis (50% or greater), or occlusion. Left carotid system: No evidence of dissection, stenosis (50% or greater), or occlusion. Vertebral arteries: Right-dominant. The left vertebral artery is diffuse is diffusely small in caliber and terminates in a PICA,  unchanged from prior exam. No evidence of dissection, stenosis (50% or greater), or occlusion. Skeleton: Negative. Other neck: Negative. Upper chest: Negative. Review of the MIP images confirms the above findings CTA HEAD FINDINGS Anterior circulation: Moderate narrowing of the supraclinoid ICA on the left. Moderate to severe focal narrowing of the A2 segment of the right ACA (series 6, image 95). Posterior circulation: Unchanged moderate focal narrowing in the proximal basilar artery (series 6, image 126/series 7, image 114)). Venous sinuses: As permitted by contrast timing, patent. Anatomic variants: None Review of the MIP images confirms the above findings IMPRESSION: 1. Moderate narrowing of the supraclinoid left ICA and moderate to severe focal narrowing of the A2 segment of the right ACA 2. Moderate focal narrowing of the proximal basilar artery. 3. No hemodynamically significant stenosis in the neck. Left vertebral artery is small in caliber and terminates as a PICA. Electronically Signed   By: Lorenza Cambridge M.D.   On: 11/23/2022 19:03   MR Brain W and Wo Contrast  Result Date: 11/23/2022 CLINICAL DATA:  Stroke suspected EXAM: MRI HEAD WITHOUT AND WITH CONTRAST TECHNIQUE: Multiplanar, multiecho pulse sequences of the brain and surrounding structures were obtained without and with intravenous contrast. CONTRAST:  10mL GADAVIST GADOBUTROL 1 MMOL/ML IV SOLN COMPARISON:  Same day CT head, MR Head 08/14/21 FINDINGS: Brain: There is an acute infarct in the medulla (series 7, image 52). Unchanged region of susceptibility artifact and FLAIR signal abnormality in the medial temporal lobe on the right, favored to represent an oligodendroglioma. No new sites of contrast enhancement in this region. No new sites of hemorrhage. No extra-axial fluid collection. Unchanged T2/FLAIR hyperintense signal abnormality in the right thalamus. Sequela of mild chronic microvascular ischemic change. 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 Vascular: Normal flow voids. Skull and upper cervical spine: Normal marrow signal. Sinuses/Orbits: No middle ear or mastoid effusion. Mucosal thickening right maxillary sinus. Bilateral orbits are unremarkable. Other: None. IMPRESSION: 1. 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 2. Unchanged nonenhancing right temporal lobe mass, previously reported to represent an oligodendroglioma. Electronically Signed   By: Lorenza Cambridge M.D.   On: 11/23/2022 17:21   CT HEAD  WO CONTRAST  Result Date: 11/23/2022 CLINICAL DATA:  Neuro deficit, acute, stroke suspected. History of malignant left temporal lobe tumor treated with radiation. EXAM: CT HEAD WITHOUT CONTRAST TECHNIQUE: Contiguous axial images were obtained from the base of the skull through the vertex without intravenous contrast. RADIATION DOSE REDUCTION: This exam was performed according to the departmental dose-optimization program which includes automated exposure control, adjustment of the mA and/or kV according to patient size and/or use of iterative reconstruction technique. COMPARISON:  Head CT 04/02/2022 FINDINGS: Brain: No abnormality seen affecting the brainstem or cerebellum. Chronic low density in the anteromedial right temporal lobe, better shown by MRI, consistent with treated tumor. No evidence progressive mass effect or edema. Few other small foci of low density in the hemispheric white matter are stable. No hemorrhage, hydrocephalus or extra-axial collection. Vascular: There is atherosclerotic calcification of the major vessels at the base of the brain. Skull: Negative Sinuses/Orbits: Clear/normal Other: Previously seen left mastoid effusion has resolved. IMPRESSION: No acute CT finding. Chronic low density in the anteromedial right temporal lobe, better shown by MRI, consistent with treated tumor. No evidence of progressive mass effect or edema. No other acute intracranial finding.  Electronically Signed   By: Paulina Fusi M.D.   On: 11/23/2022 14:19    Results are pending, will review when available.  Assessment and Plan:  * Acute CVA (cerebrovascular accident) [facility]) Patient is presenting with approximately 36-hour history of left-sided paresthesia and weakness, that is worse in the upper extremity than lower.  MRI positive for acute infarct in the medulla.  Risk factors include hyperlipidemia, however no longer smoking cigarettes, and no diagnosed history of hypertension although blood pressure is elevated here.  - Neurology consulted; appreciate their recommendations - Telemetry monitoring - Allow for permissive HTN (systolic < 220 and diastolic < 120) - Echocardiogram with bubble study - A1C and Lipid panel  - S/p ASA 325 mg.  Start aspirin 81 mg daily tomorrow - Start Lipitor 80 mg daily - PT/OT  Mixed hyperlipidemia - Lipid panel pending - Start Lipitor 80 mg daily  Polycythemia Hemoglobin elevated 17.9.  Previously ranging between 15.1 and 17.0.  Unclear etiology, potentially due to dehydration.  Will recheck tomorrow morning.  - Repeat CBC in the a.m. 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  MRI imaging stable  Seizure disorder (HCC) - Continue home Keppra and lamotrigine  Advance Care Planning:   Code Status: Full Code verified by patient  Consults: Neurology  Family Communication: Patient's wife updated at bedside  Severity of Illness: The appropriate patient status for this patient is OBSERVATION. Observation status is judged to be reasonable and necessary in order to provide the required intensity of service to ensure the patient's safety. The patient's presenting symptoms, physical exam findings, and initial radiographic and laboratory data in the context of their medical condition is felt to place them at decreased risk for further  clinical deterioration. Furthermore, it is anticipated that the patient will be medically stable for discharge from the hospital within 2 midnights of admission.   Author: Verdene Lennert, MD 11/23/2022 8:02 PM  For on call review [email or web address].

## 2022-11-23 NOTE — ED Provider Notes (Signed)
[facility] Provider Note    Event Date/Time   First MD Initiated Contact with Patient 11/23/22 1526     (approximate)  History   Chief Complaint: Numbness  HPI  Corey Chang is a 42 y.o. male with a past medical history of an oligodendroglioma status post radiation 2001, seizure disorder, presents to the emergency department for left-sided weakness/numbness.  According to the patient since yesterday morning he has been feeling numbness/tingling sensation up and down the entire left side of his body.  States he has had some trouble ambulating as well due to to a sensation of some numbness/tingling.  Patient states a history of a brain tumor treated with radiation back in 2001, last MRI had approximately 15 months ago at that time showed no concerning changes.  Patient denies any trauma.  States he has noted some mild weakness in the left hand since yesterday as well.  Physical Exam   Triage Vital Signs: ED Triage Vitals  Enc Vitals Group     BP 11/23/22 1250 (!) 136/100     Pulse Rate 11/23/22 1250 65     Resp 11/23/22 1250 18     Temp 11/23/22 1250 97.8 F (36.6 C)     Temp Source 11/23/22 1250 Oral     SpO2 11/23/22 1250 97 %     Weight --      Height --      Head Circumference --      Peak Flow --      Pain Score 11/23/22 1251 0     Pain Loc --      Pain Edu? --      Excl. in GC? --     Most recent vital signs: Vitals:   11/23/22 1250  BP: (!) 136/100  Pulse: 65  Resp: 18  Temp: 97.8 F (36.6 C)  SpO2: 97%    General: Awake, no distress.  CV:  Good peripheral perfusion.  Regular rate and rhythm  Resp:  Normal effort.  Equal breath sounds bilaterally.  Abd:  No distention.  Soft, nontender.  No rebound or guarding. Other:  Equal grip strength bilaterally.  Patient does have a slight left-sided pronator drift as well as inability to maintain full extension of the left digits.  No cranial nerve deficits appreciated.  5/5 strength in  bilateral lower extremities.  Patient states subjective tingly sensation in left face left arm and left leg.   ED Results / Procedures / Treatments   EKG  EKG viewed and interpreted by myself shows a normal sinus rhythm at 66 bpm with a narrow QRS, normal axis, normal intervals, no concerning ST changes.  RADIOLOGY  I have reviewed and interpreted CT head images.  No obvious bleed or significant normality seen on my evaluation. Radiology has read no acute finding.   MEDICATIONS ORDERED IN ED: Medications  sodium chloride flush (NS) 0.9 % injection 3 mL (has no administration in time range)  sodium chloride 0.9 % bolus 1,000 mL (has no administration in time range)     IMPRESSION / MDM / ASSESSMENT AND PLAN / ED COURSE  I reviewed the triage vital signs and the nursing notes.  Patient's presentation is most consistent with acute presentation with potential threat to life or bodily function.  Patient presents emergency department for left-sided paresthesias/tingling.  Patient noted to have mild left upper extremity weakness/pronator drift on exam as well as inability to maintain full extension of the left digits.  Patient  states he believes this just started yesterday.  Given the patient's history of the prior brain tumor we will obtain MR imaging of the brain.  CT does not appear to show any concerning findings.  Patient states this was all precipitated by episodes of nausea and vomiting that occurred yesterday and 2 days ago after handling raw chicken.  Patient states the nausea vomiting absent subsided but he does feel somewhat dehydrated.  Patient's lab work is reassuring mild hemoconcentration possibly indicative of mild dehydration as well as an anion gap of his chemistry of 12 again likely indicative of dehydration.  MRI has resulted showing acute infarct in the medulla.  I spoke with Dr. Selina Cooley of neurology.  Will order 324 mg aspirin, Plavix and admit to the hospital service for  further workup and treatment.  Neurology will consult tomorrow.  Patient agreeable to plan of care.  NIH Stroke Scale   Interval: Baseline Time: 5:33 PM Person Administering Scale: Minna Antis  Administer stroke scale items in the order listed. Record performance in each category after each subscale exam. Do not go back and change scores. Follow directions provided for each exam technique. Scores should reflect what the patient does, not what the clinician thinks the patient can do. The clinician should record answers while administering the exam and work quickly. Except where indicated, the patient should not be coached (i.e., repeated requests to patient to make a special effort).   1a  Level of consciousness: 0=alert; keenly responsive  1b. LOC questions:  0=Performs both tasks correctly  1c. LOC commands: 0=Performs both tasks correctly  2.  Best Gaze: 0=normal  3.  Visual: 0=No visual loss  4. Facial Palsy: 1=Minor paralysis (flattened nasolabial fold, asymmetric on smiling)  5a.  Motor left arm: 1=Drift, limb holds 90 (or 45) degrees but drifts down before full 10 seconds: does not hit bed  5b.  Motor right arm: 0=No drift, limb holds 90 (or 45) degrees for full 10 seconds  6a. motor left leg: 0=No drift, limb holds 90 (or 45) degrees for full 10 seconds  6b  Motor right leg:  0=No drift, limb holds 90 (or 45) degrees for full 10 seconds  7. Limb Ataxia: 0=Absent  8.  Sensory: 1=Mild to moderate sensory loss; patient feels pinprick is less sharp or is dull on the affected side; there is a loss of superficial pain with pinprick but patient is aware He is being touched  9. Best Language:  0  10. Dysarthria: 1=Mild to moderate, patient slurs at least some words and at worst, can be understood with some difficulty  11. Extinction and Inattention: 0=No abnormality  12. Distal motor function: 1=At least some extension after 5 seconds, but is not fully extended. Any movement of the  fingers which is not in response to a command is not scored   Total:   5    FINAL CLINICAL IMPRESSION(S) / ED DIAGNOSES   CVA   dehydration Nausea vomiting Left-sided paresthesias   Note:  This document was prepared using Dragon voice recognition software and may include unintentional dictation errors.   Minna Antis, MD 11/23/22 1734

## 2022-11-23 NOTE — Assessment & Plan Note (Addendum)
Hemoglobin down to 18.3.  Follow JAK2 mutation analysis and erythropoietin level for work up of polycythemia.

## 2022-11-23 NOTE — Assessment & Plan Note (Signed)
Continue home Keppra and lamotrigine

## 2022-11-23 NOTE — Assessment & Plan Note (Signed)
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  MRI imaging stable

## 2022-11-23 NOTE — Assessment & Plan Note (Signed)
LDL 192, continue high-dose Lipitor.

## 2022-11-23 NOTE — Assessment & Plan Note (Addendum)
Patient is presenting with approximately 36-hour history of left-sided paresthesia and weakness.  MRI showing acute stroke in the medulla.  Continue on aspirin (lifelong), Plavix (another 18 days) and Lipitor (lifelong).  PT recommending acute inpatient rehab.  Neurology sent off a hypercoagulable workup.  Stable to go out to rehab today.

## 2022-11-23 NOTE — ED Triage Notes (Signed)
Patient to ED for left side numbness- feels like pins and needles on entire left side. [facility] Sunday when going to bed. Patient states he vomited all day yesterday- no vomiting today. Equal grip but sensation not the same on both side. Walking with new limp. Answers question appropriately. Hx of brain tumor and seizures.

## 2022-11-24 ENCOUNTER — Observation Stay (HOSPITAL_COMMUNITY)
Admit: 2022-11-24 | Discharge: 2022-11-24 | Disposition: A | Discharge status: Home / Self Care | Payer: BC Managed Care – PPO | Attending: Internal Medicine | Admitting: Internal Medicine

## 2022-11-24 DIAGNOSIS — E782 Mixed hyperlipidemia: Secondary | ICD-10-CM | POA: Diagnosis present

## 2022-11-24 DIAGNOSIS — I639 Cerebral infarction, unspecified: Secondary | ICD-10-CM | POA: Diagnosis not present

## 2022-11-24 DIAGNOSIS — Z882 Allergy status to sulfonamides status: Secondary | ICD-10-CM | POA: Diagnosis not present

## 2022-11-24 DIAGNOSIS — Z833 Family history of diabetes mellitus: Secondary | ICD-10-CM | POA: Diagnosis not present

## 2022-11-24 DIAGNOSIS — I6389 Other cerebral infarction: Secondary | ICD-10-CM | POA: Diagnosis not present

## 2022-11-24 DIAGNOSIS — R4781 Slurred speech: Secondary | ICD-10-CM | POA: Diagnosis present

## 2022-11-24 DIAGNOSIS — Z87891 Personal history of nicotine dependence: Secondary | ICD-10-CM | POA: Diagnosis not present

## 2022-11-24 DIAGNOSIS — Z923 Personal history of irradiation: Secondary | ICD-10-CM | POA: Diagnosis not present

## 2022-11-24 DIAGNOSIS — E871 Hypo-osmolality and hyponatremia: Secondary | ICD-10-CM | POA: Insufficient documentation

## 2022-11-24 DIAGNOSIS — Z8249 Family history of ischemic heart disease and other diseases of the circulatory system: Secondary | ICD-10-CM | POA: Diagnosis not present

## 2022-11-24 DIAGNOSIS — Z85841 Personal history of malignant neoplasm of brain: Secondary | ICD-10-CM | POA: Diagnosis not present

## 2022-11-24 DIAGNOSIS — D751 Secondary polycythemia: Secondary | ICD-10-CM | POA: Diagnosis present

## 2022-11-24 DIAGNOSIS — Z79899 Other long term (current) drug therapy: Secondary | ICD-10-CM | POA: Diagnosis not present

## 2022-11-24 DIAGNOSIS — Z1152 Encounter for screening for COVID-19: Secondary | ICD-10-CM | POA: Diagnosis not present

## 2022-11-24 DIAGNOSIS — G40909 Epilepsy, unspecified, not intractable, without status epilepticus: Secondary | ICD-10-CM | POA: Diagnosis present

## 2022-11-24 DIAGNOSIS — Z820 Family history of epilepsy and other diseases of the nervous system: Secondary | ICD-10-CM | POA: Diagnosis not present

## 2022-11-24 DIAGNOSIS — E860 Dehydration: Secondary | ICD-10-CM | POA: Diagnosis present

## 2022-11-24 DIAGNOSIS — I10 Essential (primary) hypertension: Secondary | ICD-10-CM | POA: Diagnosis present

## 2022-11-24 DIAGNOSIS — R29705 NIHSS score 5: Secondary | ICD-10-CM | POA: Diagnosis present

## 2022-11-24 LAB — BASIC METABOLIC PANEL
Anion gap: 6 (ref 5–15)
BUN: 10 mg/dL (ref 6–20)
CO2: 23 mmol/L (ref 22–32)
Calcium: 9.4 mg/dL (ref 8.9–10.3)
Chloride: 105 mmol/L (ref 98–111)
Creatinine, Ser: 0.84 mg/dL (ref 0.61–1.24)
GFR, Estimated: >60 mL/min (ref >60)
Glucose, Bld: 96 mg/dL (ref 70–99)
Potassium: 3.6 mmol/L (ref 3.5–5.1)
Sodium: 134 mmol/L — ABNORMAL LOW (ref 135–145)

## 2022-11-24 LAB — CBC WITH DIFFERENTIAL/PLATELET
Abs Immature Granulocytes: 0.01 10*3/uL (ref 0.00–0.07)
Basophils Absolute: 0.1 10*3/uL (ref 0.0–0.1)
Basophils Relative: 2 %
Eosinophils Absolute: 0.1 10*3/uL (ref 0.0–0.5)
Eosinophils Relative: 2 %
HCT: 49.1 % (ref 39.0–52.0)
Hemoglobin: 16.7 g/dL (ref 13.0–17.0)
Immature Granulocytes: 0 %
Lymphocytes Relative: 44 %
Lymphs Abs: 2 10*3/uL (ref 0.7–4.0)
MCH: 30.2 pg (ref 26.0–34.0)
MCHC: 34 g/dL (ref 30.0–36.0)
MCV: 88.8 fL (ref 80.0–100.0)
Monocytes Absolute: 0.4 10*3/uL (ref 0.1–1.0)
Monocytes Relative: 9 %
Neutro Abs: 2 10*3/uL (ref 1.7–7.7)
Neutrophils Relative %: 43 %
Platelets: 238 10*3/uL (ref 150–400)
RBC: 5.53 MIL/uL (ref 4.22–5.81)
RDW: 13.1 % (ref 11.5–15.5)
WBC: 4.5 10*3/uL (ref 4.0–10.5)
nRBC: 0 % (ref 0.0–0.2)

## 2022-11-24 LAB — ECHOCARDIOGRAM LIMITED BUBBLE STUDY: S' Lateral: 3 cm

## 2022-11-24 LAB — PROTIME-INR
INR: 1 (ref 0.8–1.2)
Prothrombin Time: 13.7 seconds (ref 11.4–15.2)

## 2022-11-24 LAB — APTT: aPTT: 36 seconds (ref 24–36)

## 2022-11-24 NOTE — Progress Notes (Signed)
  Inpatient Rehabilitation Admissions Coordinator   Met at bedside with patient, wife, father in law and son at bedside for rehab assessment. We discussed goals and expectations of a possible CIR admit. They prefer CIR for rehab but would like to know difference in cost of OP vs CIR. Family can provide expected caregiver support that is recommended of supervision level. I will contact [employer] tomorrow to assess cost and call patient by 1030 am in the am to discuss his preference. Please call me with any questions.   Ottie Glazier, RN, MSN Rehab Admissions Coordinator [phone]

## 2022-11-24 NOTE — Evaluation (Signed)
Physical Therapy Evaluation Patient Details Name: Corey Chang MRN: [id number] DOB: Nov 28, 1980 Today's Date: 11/24/2022  History of Present Illness  Corey Chang is a 42 y.o. male with medical history significant of oligodendroglioma s/p radiation, seizure disorder, hyperlipidemia, who presents to the ED due to left-sided numbness. 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  Neurology consulted.  Patient started on aspirin and Plavix.   Clinical Impression  Patient received in bed, wife at bedside. He is pleasant and agrees to PT assessment. Patient is independent with bed mobility. He has decreased sensation over entire left side. Described as tingling. Patient has weakness of left UE and weak L LE grossly 3+/5. Also has mild slurred speech. Patient demonstrates decreased coordination with mobility, poor attention to L side, poor left LE placement and weakness, ataxic gait. He is at increased fall risk. Patient will continue to benefit from skilled PT to improve balance, coordination and strength to maximize independence and safety.         Recommendations for follow up therapy are one component of a multi-disciplinary discharge planning process, led by the attending physician.  Recommendations may be updated based on patient status, additional functional criteria and insurance authorization.  Follow Up Recommendations       Assistance Recommended at Discharge Intermittent Supervision/Assistance  Patient can return home with the following  A little help with walking and/or transfers;A little help with bathing/dressing/bathroom;Assist for transportation;Help with stairs or ramp for entrance;Assistance with cooking/housework    Equipment Recommendations Cane  Recommendations for Other Services  Rehab consult    Functional Status Assessment Patient has had a  recent decline in their functional status and demonstrates the ability to make significant improvements in function in a reasonable and predictable amount of time.     Precautions / Restrictions Precautions Precautions: Fall Restrictions Weight Bearing Restrictions: No      Mobility  Bed Mobility Overal bed mobility: Independent                  Transfers Overall transfer level: Needs assistance Equipment used: None Transfers: Sit to/from Stand Sit to Stand: Min guard                Ambulation/Gait Ambulation/Gait assistance: Min guard, Min assist Gait Distance (Feet): 30 Feet Assistive device: None, 1 person hand held assist Gait Pattern/deviations: Step-through pattern, Drifts right/left Gait velocity: WNL     General Gait Details: does fairly well with ambulation with min guard, decreased attention/sensation on left side, running into bed x 1. Poor coordination and left foot placement due to decreased sensation. When ambulating with single hand held assist, he demonstrated improved steadiness.  Stairs            Wheelchair Mobility    Modified Rankin (Stroke Patients Only)       Balance Overall balance assessment: Needs assistance Sitting-balance support: Feet supported Sitting balance-Leahy Scale: Good     Standing balance support: Single extremity supported, No upper extremity supported, During functional activity Standing balance-Leahy Scale: Fair Standing balance comment: patient at increased fall risk                             Pertinent Vitals/Pain Pain Assessment Pain Assessment: No/denies pain    Home Living Family/patient expects to be discharged to:: Private residence Living Arrangements: Spouse/significant other Available Help at Discharge:  Family;Available 24 hours/day Type of Home: House Home Access: Stairs to enter Entrance Stairs-Rails: None Entrance Stairs-Number of Steps: 2   Home Layout: Two  level;Able to live on main level with bedroom/bathroom Home Equipment: Crutches Additional Comments: pt lives with wife (who works from home) and 2 children. 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 L LE weaker in hip flexors but grossly LLE Sensation: decreased light touch LLE Coordination: decreased gross motor    Cervical / Trunk Assessment Cervical / Trunk Assessment: Normal  Communication   Communication: Other (comment) (slight slurred speech)  Cognition Arousal/Alertness: Awake/alert Behavior During Therapy: WFL for tasks assessed/performed Overall Cognitive Status: Within Functional Limits for tasks assessed                                          General Comments      Exercises     Assessment/Plan    PT Assessment Patient needs continued PT services  PT Problem List Decreased strength;Decreased balance;Decreased coordination;Decreased knowledge of use of DME;Impaired sensation       PT Treatment Interventions DME instruction;Gait training;Stair training;Functional mobility training;Therapeutic activities;Patient/family education;Neuromuscular re-education;Balance training;Therapeutic exercise    PT Goals (Current goals can be found in the Care Plan section)  Acute Rehab PT Goals Patient Stated Goal: to return to work PT Goal Formulation: With patient Time For Goal Achievement: 12/08/22 Potential to Achieve Goals: Fair    Frequency 7X/week     Co-evaluation                AM-PAC PT "6 Clicks" Mobility  Outcome Measure Help needed turning from your back to your side while in a flat bed without using bedrails?: None Help needed moving from lying on your back to sitting on the side of a flat bed without using bedrails?: None Help needed moving to and from a bed to a chair (including a wheelchair)?: A Little Help needed standing up from a chair using your arms (e.g., wheelchair or bedside chair)?: A Little Help needed to walk in hospital room?: A Little Help needed climbing 3-5 steps with a railing? : A Lot 6 Click Score: 19    End of Session Equipment Utilized During Treatment: Gait belt Activity Tolerance: Patient tolerated treatment well Patient left: in bed;with call bell/phone within reach;with bed alarm set;with family/visitor present Nurse Communication: Mobility status PT Visit Diagnosis: Unsteadiness on feet (R26.81);Other abnormalities of gait and mobility (R26.89);Muscle weakness (generalized) (M62.81);Difficulty in walking, not elsewhere classified (R26.2);Hemiplegia and hemiparesis Hemiplegia - Right/Left: Left Hemiplegia - dominant/non-dominant: Non-dominant Hemiplegia - caused by: Cerebral infarction    Time: 0915-0940 PT Time Calculation (min) (ACUTE ONLY): 25 min   Charges:   PT Evaluation $PT Eval Moderate Complexity: 1 Mod PT Treatments $Gait Training: 8-22 mins        Kieley Akter, PT, GCS 11/24/22,10:17 AM

## 2022-11-24 NOTE — Assessment & Plan Note (Addendum)
Sodium 2 points less than the normal range.

## 2022-11-24 NOTE — Hospital Course (Addendum)
42 y.o. male with medical history significant of oligodendroglioma s/p radiation, seizure disorder, hyperlipidemia, who presents to the ED due to left-sided numbness.   Corey Chang states that yesterday morning when he awoke, he noticed that his left arm seemed numb with tingling and weaker than his right.  In addition, his left leg seemed weak.  He assumed that he had slept on both abnormal.  Then he developed sudden onset dizziness with nausea and vomiting.  He had several episodes of vomiting that were nonbloody, nonmelanotic. He continued to feel unwell throughout the day.  Then today, he notes that he was having difficulty with walking and had to hold onto something to not fall.  Due to this, he came to the ED.  He denies any fever, chills, palpitations, chest pain or shortness of breath.  He denies any prior knowledge of arrhythmias. Mri showing acute stroke in medulla.  5/8.  Echocardiogram shows normal EF and agitated saline study negative.  MRI shows an acute stroke medulla.  Patient on aspirin and Plavix and Lipitor. 5/9.  Patient still weak and numb on the left side.  Will go to acute inpatient rehab on 5/10. 5/10.  Patient stable to go out to acute inpatient rehab.

## 2022-11-24 NOTE — Progress Notes (Signed)
*  PRELIMINARY RESULTS* Echocardiogram 2D Echocardiogram has been performed.  Corey Chang 11/24/2022, 7:49 AM

## 2022-11-24 NOTE — Progress Notes (Signed)
Progress Note   Patient: Ardell Hegge ZOX:[id number] DOB: 11/20/1980 DOA: 11/23/2022     0 DOS: the patient was seen and examined on 11/24/2022   Brief hospital course: 42 y.o. male with medical history significant of oligodendroglioma s/p radiation, seizure disorder, hyperlipidemia, who presents to the ED due to left-sided numbness.   Mr. Johannessen states that yesterday morning when he awoke, he noticed that his left arm seemed numb with tingling and weaker than his right.  In addition, his left leg seemed weak.  He assumed that he had slept on both abnormal.  Then he developed sudden onset dizziness with nausea and vomiting.  He had several episodes of vomiting that were nonbloody, nonmelanotic. He continued to feel unwell throughout the day.  Then today, he notes that he was having difficulty with walking and had to hold onto something to not fall.  Due to this, he came to the ED.  He denies any fever, chills, palpitations, chest pain or shortness of breath.  He denies any prior knowledge of arrhythmias.  5/8.  Echocardiogram shows normal EF and agitated saline study negative.  MRI shows an acute stroke medulla.  Patient on aspirin and Plavix and Lipitor.  Assessment and Plan: * Acute CVA (cerebrovascular accident) [facility]) Patient is presenting with approximately 36-hour history of left-sided paresthesia and weakness.  MRI showing acute stroke in the medulla.  Patient started on aspirin Plavix and Lipitor.  PT recommending acute inpatient rehab.  Mixed hyperlipidemia LDL 192, continue high-dose Lipitor.  Polycythemia Hemoglobin down to 16.7.  Hyponatremia Sodium 1 point less than the normal range.  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  MRI imaging stable  Seizure disorder (HCC) Continue home Keppra and lamotrigine        Subjective: Patient with left-sided weakness and numbness.  Diagnosed  with stroke to the medulla.  Interested in acute inpatient rehab.  Physical Exam: Vitals:   11/23/22 2336 11/24/22 0357 11/24/22 0500 11/24/22 0759  BP: (!) 129/96 (!) 124/91  (!) 118/93  Pulse: 63 63  (!) 59  Resp: 19 18  18   Temp: 97.6 F (36.4 C) 98.4 F (36.9 C)  98.1 F (36.7 C)  TempSrc: Oral   Oral  SpO2: 99% 98%  96%  Weight:   83.7 kg   Height:   5\' 11"  (1.803 m)    Physical Exam HENT:     Head: Normocephalic.     Mouth/Throat:     Pharynx: No oropharyngeal exudate.  Eyes:     General: Lids are normal.     Conjunctiva/sclera: Conjunctivae normal.  Cardiovascular:     Rate and Rhythm: Normal rate and regular rhythm.     Heart sounds: Normal heart sounds, S1 normal and S2 normal.  Pulmonary:     Breath sounds: No decreased breath sounds, wheezing, rhonchi or rales.  Abdominal:     Palpations: Abdomen is soft.     Tenderness: There is no abdominal tenderness.  Musculoskeletal:     Right lower leg: No swelling.     Left lower leg: No swelling.  Skin:    General: Skin is warm.     Findings: No rash.  Neurological:     Mental Status: He is alert and oriented to person, place, and time.     Comments: Occasional slurred speech.  Power 4+ out of 5 left arm and leg.  Incoordination with the left hand with rapid finger movements.  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  Physical therapy recommending acute inpatient rehab.  Planned Discharge Destination: Acute inpatient rehab    Time spent: 28 minutes  Author: Alford Highland, MD 11/24/2022 2:50 PM  For on call review [email or web address].

## 2022-11-24 NOTE — Consult Note (Incomplete)
NEUROLOGY CONSULTATION NOTE   Date of service: Nov 24, 2022 Patient Name: Corey Chang MRN:  [id number] DOB:  Jan 22, 1981 Reason for consult: acute ischemic stroke Requesting physician: Dr. Alford Highland _ _ _   _ __   _ __ _ _  __ __   _ __   __ _  History of Present Illness   This is a 42 yo man with hx oligodendroglioma s/p radiation, seizure disorder, HL who presented to ED with L sided numbness that began the morning prior to presentation. He also had some associated L sided weakness and vertigo with vomiting. On the day of admission he had trouble walking 2/2 leg left weakness, incoordination, and imbalance, and could not stand unassisted. 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  Stroke workup this admission  CTA head  1. Moderate narrowing of the supraclinoid left ICA and moderate to severe focal narrowing of the A2 segment of the right ACA 2. Moderate focal narrowing of the proximal basilar artery. 3. No hemodynamically significant stenosis in the neck. Left vertebral artery is small in caliber and terminates as a PICA.  Personally reviewed; I agree with above interpretation  TTE - no intracardiac clot, neg bubble study  Stroke Labs     Component Value Date/Time   CHOL 278 (H) 11/23/2022 1253   TRIG 254 (H) 11/23/2022 1253   HDL 35 (L) 11/23/2022 1253   CHOLHDL 7.9 11/23/2022 1253   VLDL 51 (H) 11/23/2022 1253   LDLCALC 192 (H) 11/23/2022 1253    Lab Results  Component Value Date/Time   HGBA1C 5.1 11/23/2022 12:53 PM        ROS   Per HPI: all other systems reviewed and are negative  Past History   I have reviewed the following:  Past Medical History:  Diagnosis Date  . Allergic rhinitis 04/22/97  . Ankle fracture, right 07/2007  . 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  He had external beam radiation in August  2001 with 54 Gy in 30 fractions on RTOG 9802 protocol.   He declined chemotherapy.   . Seizure (HCC)    from history of oligodendroglioma   Past Surgical History:  Procedure Laterality Date  . ANKLE SURGERY     right, x 3  . brain biopsy  1997, 2001   Family History  Problem Relation Age of Onset  . Alzheimer's disease Mother   . Diabetes Father   . Heart disease Maternal Grandfather        MI  . Parkinsonism Paternal Grandmother   . Alzheimer's disease Paternal Grandmother   . Depression Neg Hx   . Drug abuse Neg Hx   . Alcohol abuse Neg Hx   . Cancer Neg Hx        no colon, prostate, breast, uterine,ovarian   Social History   Socioeconomic History  . Marital status: Married    Spouse name: Aundra Millet  . Number of children: 2  . Years of education: college  . Highest education level: Not on file  Occupational History  . 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 Smoking status: Former    Packs/day: .5    Types: Cigarettes    Quit date: 07/19/2006    Years since quitting: 16.3  .  Smokeless tobacco: Current    Types: Snuff  . Tobacco comments:    Quit 2006  Dipping everyday  Vaping Use  . Vaping Use: Never used  Substance and Sexual Activity  . Alcohol use: Yes    Alcohol/week: 2.0 standard drinks of alcohol    Types: 2 Cans of beer per week    Comment: 2-3 beer daily  . Drug use: No  . Sexual activity: Yes    Birth control/protection: Pill  Other Topics Concern  . Not on file  Social History Narrative   10/06/21   From: the area   Living: with wife, Aundra Millet (2008) and children   Work: Chief Technology Officer with [employer]   [employer] story   Family: 2 children - Mollie (2011) and Armed forces logistics/support/administrative officer (2014)      Enjoys: shoot - hunting and shooting      Exercise: trying to get kids to do soccer and play with children in sports   Diet: whatever he wants      Safety   Seat belts: Yes  and occasionally  does not   Guns: Yes and secure   Safe in relationships: Yes       Social Determinants of Health   Financial Resource Strain: Not on file  Food Insecurity: No Food Insecurity (11/23/2022)   Hunger Vital Sign   . Worried About Programme researcher, broadcasting/film/video in the Last Year: Never true   . Ran Out of Food in the Last Year: Never true  Transportation Needs: No Transportation Needs (11/23/2022)   PRAPARE - Transportation   . Lack of Transportation (Medical): No   . Lack of Transportation (Non-Medical): No  Physical Activity: Not on file  Stress: Not on file  Social Connections: Not on file   Allergies  Allergen Reactions  . Carbatrol [Carbamazepine]     Not sure but may have caused a rash  . Sulfa Antibiotics Rash    Medications   Medications Prior to Admission  Medication Sig Dispense Refill Last Dose  . albuterol (VENTOLIN HFA) 108 (90 Base) MCG/ACT inhaler Inhale 2 puffs into the lungs every 6 (six) hours as needed for wheezing or shortness of breath. 8 g 2 unknown  . esomeprazole (NEXIUM) 40 MG capsule Take 40 mg by mouth daily at 12 noon.   11/23/2022  . lamoTRIgine (LAMICTAL XR) 100 MG 24 hour tablet Take 2 tablets every night (Patient taking differently: Take 200 mg by mouth every evening. Take 2 tablets every night) 180 tablet 3 11/22/2022  . levETIRAcetam (KEPPRA) 500 MG tablet Take 3 tablets (1,500 mg total) by mouth 2 (two) times daily. 540 tablet 3 11/23/2022  . loratadine (CLARITIN) 10 MG tablet Take 10 mg by mouth daily.   11/22/2022  . Naproxen Sodium (ALEVE PO) Take 1 tablet by mouth 2 (two) times daily.    11/23/2022  . rosuvastatin (CRESTOR) 20 MG tablet Take 1 tablet (20 mg total) by mouth daily. (Patient not taking: Reported on 11/15/2022) 30 tablet 0       Current Facility-Administered Medications:  .  acetaminophen (TYLENOL) tablet 650 mg, 650 mg, Oral, Q4H PRN **OR** acetaminophen (TYLENOL) 160 MG/5ML solution 650 mg, 650 mg, Per Tube, Q4H PRN **OR** acetaminophen (TYLENOL)  suppository 650 mg, 650 mg, Rectal, Q4H PRN, Verdene Lennert, MD .  aspirin EC tablet 81 mg, 81 mg, Oral, Daily, Verdene Lennert, MD, 81 mg at 11/24/22 0821 .  atorvastatin (LIPITOR) tablet 80 mg, 80 mg, Oral, Daily, Verdene Lennert, MD, 80 mg at  11/23/22 2122 .  clopidogrel (PLAVIX) tablet 75 mg, 75 mg, Oral, Daily, Verdene Lennert, MD, 75 mg at 11/24/22 0821 .  enoxaparin (LOVENOX) injection 40 mg, 40 mg, Subcutaneous, Q24H, Verdene Lennert, MD, 40 mg at 11/23/22 2123 .  lamoTRIgine (LAMICTAL XR) 24 hour tablet 200 mg, 200 mg, Oral, QPM, Verdene Lennert, MD, 200 mg at 11/23/22 2122 .  levETIRAcetam (KEPPRA) tablet 1,500 mg, 1,500 mg, Oral, BID, Verdene Lennert, MD, 1,500 mg at 11/24/22 0821 .  senna-docusate (Senokot-S) tablet 1 tablet, 1 tablet, Oral, QHS PRN, Verdene Lennert, MD  Vitals   Vitals:   11/24/22 0357 11/24/22 0500 11/24/22 0759 11/24/22 1500  BP: (!) 124/91  (!) 118/93 121/86  Pulse: 63  (!) 59 63  Resp: 18  18 18   Temp: 98.4 F (36.9 C)  98.1 F (36.7 C) 98.2 F (36.8 C)  TempSrc:   Oral Oral  SpO2: 98%  96% 96%  Weight:  83.7 kg    Height:  5\' 11"  (1.803 m)       Body mass index is 25.74 kg/m.  Physical Exam   Physical Exam Gen: A&O x4, NAD HEENT: Atraumatic, normocephalic;mucous membranes moist; oropharynx clear, tongue without atrophy or fasciculations. Neck: Supple, trachea midline. Resp: CTAB, no w/r/r CV: RRR, no m/g/r; nml S1 and S2. 2+ symmetric peripheral pulses. Abd: soft/NT/ND; nabs x 4 quad Extrem: Nml bulk; no cyanosis, clubbing, or edema.  Neuro: *MS: A&O x4. Follows multi-step commands.  *Speech: fluid, mild dysarthria, able to name and repeat *CN:    I: Deferred   II,III: PERRLA, VFF by confrontation, optic discs unable to be visualized 2/2 pupillary constriction   III,IV,VI: EOMI w/o nystagmus, no ptosis   V: Sensation intact from V1 to V3 to LT   VII: Eyelid closure was full.  Smile symmetric.   VIII: Hearing intact to voice   IX,X:  Voice normal, palate elevates symmetrically    XI: SCM/trap 5/5 bilat   XII: Tongue protrudes midline, no atrophy or fasciculations   *Motor:   Normal bulk.  No tremor, rigidity or bradykinesia. Drift in LUE and LLE, full strength on R *Sensory: Sensory impairment to PP on LPropioception intact bilat.  No double-simultaneous extinction.  *Coordination:  Ataxia on L FNF *Reflexes:  2+ and symmetric throughout without clonus; toes down-going bilat *Gait: deferred  NIHSS  1a Level of Conscious.: 0 1b LOC Questions: 0 1c LOC Commands: 0 2 Best Gaze: 0 3 Visual: 0 4 Facial Palsy: 0 5a Motor Arm - left: 1 5b Motor Arm - Right: 0 6a Motor Leg - Left: 1 6b Motor Leg - Right: 0 7 Limb Ataxia: 1 8 Sensory: 1 9 Best Language: 0 10 Dysarthria: 1 11 Extinct. and Inatten.: 0  TOTAL: 5   Premorbid mRS = 0   Labs   CBC:  Recent Labs  Lab 11/23/22 1252 11/24/22 0607  WBC 5.9 4.5  NEUTROABS 3.2 2.0  HGB 17.9* 16.7  HCT 52.9* 49.1  MCV 88.6 88.8  PLT 267 238    Basic Metabolic Panel:  Lab Results  Component Value Date   NA 134 (L) 11/24/2022   K 3.6 11/24/2022   CO2 23 11/24/2022   GLUCOSE 96 11/24/2022   BUN 10 11/24/2022   CREATININE 0.84 11/24/2022   CALCIUM 9.4 11/24/2022   GFRNONAA >60 11/24/2022   GFRAA >60 04/13/2016   Lipid Panel:  Lab Results  Component Value Date   LDLCALC 192 (H) 11/23/2022   HgbA1c:  Lab Results  Component Value Date   HGBA1C 5.1 11/23/2022   Urine Drug Screen: No results found for: "LABOPIA", "COCAINSCRNUR", "LABBENZ", "AMPHETMU", "THCU", "LABBARB"  Alcohol Level     Component Value Date/Time   ETH <10 11/23/2022 1252    Impression   This is a 42 yo man with hx oligodendroglioma s/p radiation, seizure disorder, HL who presented to ED with L sided weakness and numbness, incoordination, and vertigo and was found to have an acute ischemic medullary infarct. Etiology is favored to be small vessel, however based on his age and  lack of risk factors other than hyperlipidemia we will order hypercoag workup and ambulatory cardiac monitoring.  Recommendations   - ASA 81mg  daily + plavix 75mg  daily x21 days f/b ASA 81mg  daily monotherapy after that - Atorvastatin 80mg  daily - Hypercoag workup - PT/OT/SLP - Amb cardiac monitoring after discharge - Amb referral to outpatient neurology  Will continue to follow ______________________________________________________________________   Thank you for the opportunity to take part in the care of this patient. If you have any further questions, please contact the neurology consultation attending.  Signed,  Bing Neighbors, MD [facility] [phone]  If 7pm- 7am, please page neurology on call as listed in AMION.  **Any copied and pasted documentation in this note was written by me in another application not billed for and pasted by me into this document.

## 2022-11-24 NOTE — Evaluation (Signed)
Occupational Therapy Evaluation Patient Details Name: Corey Chang MRN: [id number] DOB: Sep 01, 1980 Today's Date: 11/24/2022   History of Present Illness Chalres Chang is a 42 y.o. male with medical history significant of oligodendroglioma s/p radiation, seizure disorder, hyperlipidemia, who presents to the ED due to left-sided numbness. 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   Clinical Impression   Corey Chang was seen for OT evaluation this date. Prior to hospital admission, pt was IND including working full time. Pt lives with spouse and 2 children in multilevel home. Pt presents to acute OT demonstrating impaired ADL performance and functional mobility 2/2 functional impaired use of non-dominant LUE and coordination deficits. Pt reports L sensation deficits, notably drops items in L hand when not attending to task. Poor safety awareness.  Pt currently requires SUPERVISION don B shoes seated EOB, does not incorporate non-dominant LUE. MIN A + intermittent R HHA funcitonal mobility. MIN A hand washing standing sink side. Pt would benefit from skilled OT to address noted impairments and functional limitations (see below for any additional details). Upon hospital discharge, recommend follow up therapy >3 hours/day.   Recommendations for follow up therapy are one component of a multi-disciplinary discharge planning process, led by the attending physician.  Recommendations may be updated based on patient status, additional functional criteria and insurance authorization.   Assistance Recommended at Discharge Intermittent Supervision/Assistance  Patient can return home with the following A lot of help with walking and/or transfers;A lot of help with bathing/dressing/bathroom;Help with stairs or ramp for entrance    Functional Status Assessment  Patient has had a recent decline in their functional status and demonstrates the ability to make significant improvements in function in a  reasonable and predictable amount of time.  Equipment Recommendations  BSC/3in1    Recommendations for Other Services Rehab consult     Precautions / Restrictions Precautions Precautions: Fall Restrictions Weight Bearing Restrictions: No      Mobility Bed Mobility Overal bed mobility: Independent                  Transfers Overall transfer level: Needs assistance Equipment used: None Transfers: Sit to/from Stand Sit to Stand: Min assist                  Balance Overall balance assessment: Needs assistance Sitting-balance support: Feet supported Sitting balance-Leahy Scale: Good     Standing balance support: No upper extremity supported, During functional activity Standing balance-Leahy Scale: Fair                             ADL either performed or assessed with clinical judgement   ADL Overall ADL's : Needs assistance/impaired                                       General ADL Comments: SUPERVISION don B shoes seated EOB, does not incorporate non-dominant LUE. MIN A + intermittent R HHA funcitonal mobility. MIN A hand washing standing sink side     Vision Baseline Vision/History: 0 No visual deficits Patient Visual Report: No change from baseline              Pertinent Vitals/Pain Pain Assessment Pain Assessment: No/denies pain     Hand Dominance Right   Extremity/Trunk Assessment Upper Extremity Assessment Upper Extremity Assessment: LUE deficits/detail LUE Deficits / Details: Grossly 3+/5,  reports numbness/tingling t/o LUE. RAM and FNF impaired, achieves FNF with increased time   Lower Extremity Assessment Lower Extremity Assessment: Defer to PT evaluation       Communication Communication Communication: Expressive difficulties (slurred speech)   Cognition Arousal/Alertness: Awake/alert Behavior During Therapy: WFL for tasks assessed/performed, Impulsive Overall Cognitive Status: Within Functional  Limits for tasks assessed                                 General Comments: pleasant, impulsive, poor insight into deficits/safety     General Comments       Exercises Exercises: Other exercises Other Exercises Other Exercises: Pt provided green theraputty and educated on HEP including pinch, grip   Shoulder Instructions      Home Living Family/patient expects to be discharged to:: Private residence Living Arrangements: Spouse/significant other Available Help at Discharge: Family;Available 24 hours/day Type of Home: House Home Access: Stairs to enter [employer] of Steps: 2 Entrance Stairs-Rails: None Home Layout: Two level;Able to live on main level with bedroom/bathroom   Alternate Level Stairs-Rails: Right Bathroom Shower/Tub: Producer, television/film/video: Standard     Home Equipment: Crutches   Additional Comments: pt lives with wife (who works from home) and 2 children. 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      Prior Functioning/Environment Prior Level of Function : Independent/Modified Independent;Driving;Working/employed                        OT Problem List: Decreased strength;Decreased range of motion;Decreased activity tolerance;Impaired balance (sitting and/or standing);Decreased coordination;Impaired UE functional use;Decreased safety awareness      OT Treatment/Interventions: Self-care/ADL training;Therapeutic exercise;Neuromuscular education;DME and/or AE instruction;Therapeutic activities;Balance training;Patient/family education    OT Goals(Current goals can be found in the care plan section) Acute Rehab OT Goals Patient Stated Goal: to return to work OT Goal Formulation: With patient Time For Goal Achievement: 12/08/22 Potential to Achieve Goals: Good ADL Goals Pt Will Perform Grooming: standing;with supervision Pt Will Perform Lower Body Dressing: with modified independence;sit to/from stand Pt Will Transfer  to Toilet: Independently;ambulating;regular height toilet Pt/caregiver will Perform Home Exercise Program: Increased strength;Increased ROM;Left upper extremity;Independently;With theraputty  OT Frequency: Min 3X/week    Co-evaluation              AM-PAC OT "6 Clicks" Daily Activity     Outcome Measure Help from another person eating meals?: None Help from another person taking care of personal grooming?: A Little Help from another person toileting, which includes using toliet, bedpan, or urinal?: A Little Help from another person bathing (including washing, rinsing, drying)?: A Little Help from another person to put on and taking off regular upper body clothing?: A Little Help from another person to put on and taking off regular lower body clothing?: A Little 6 Click Score: 19   End of Session Equipment Utilized During Treatment: Gait belt  Activity Tolerance: Patient tolerated treatment well Patient left: in bed;with call bell/phone within reach;with bed alarm set;with family/visitor present  OT Visit Diagnosis: Other abnormalities of gait and mobility (R26.89);Muscle weakness (generalized) (M62.81)                Time: 1610-9604 OT Time Calculation (min): 29 min Charges:  OT General Charges $OT Visit: 1 Visit OT Evaluation $OT Eval Moderate Complexity: 1 Mod OT Treatments $Self Care/Home Management : 8-22 mins  Kathie Dike, M.S. OTR/L  11/24/22, 3:09 PM  ascom [phone]

## 2022-11-25 ENCOUNTER — Encounter: Payer: Self-pay | Admitting: Internal Medicine

## 2022-11-25 DIAGNOSIS — E782 Mixed hyperlipidemia: Secondary | ICD-10-CM | POA: Diagnosis not present

## 2022-11-25 DIAGNOSIS — D751 Secondary polycythemia: Secondary | ICD-10-CM | POA: Diagnosis not present

## 2022-11-25 DIAGNOSIS — I639 Cerebral infarction, unspecified: Secondary | ICD-10-CM | POA: Diagnosis not present

## 2022-11-25 DIAGNOSIS — E871 Hypo-osmolality and hyponatremia: Secondary | ICD-10-CM | POA: Diagnosis not present

## 2022-11-25 LAB — ANTITHROMBIN III: AntiThromb III Func: 102 % (ref 75–120)

## 2022-11-25 NOTE — Progress Notes (Addendum)
  Inpatient Rehabilitation Admissions Coordinator   I have left patient a voicemail to call me to discuss his benefits as he requested and to follow up on our discussions for venue preference for rehab.  Ottie Glazier, RN, MSN Rehab Admissions Coordinator [phone] 11/25/2022 10:09 AM   I spoke with patient and reviewed estimated cost of care for CIR admit vs OP therapy. He prefers CIR admit. I will begin Auth with [employer] for possible admit to CIR .  Ottie Glazier, RN, MSN Rehab Admissions Coordinator [phone] 11/25/2022 10:38 AM

## 2022-11-25 NOTE — Progress Notes (Signed)
Inpatient Rehabilitation Admissions Coordinator   I have arranged with [facility] to pickup at 1130 Friday to admit to [address] with Dr Shearon Stalls admitting to [facility] CIR.  Ottie Glazier, RN, MSN Rehab Admissions Coordinator [phone] 11/25/2022 4:09 PM

## 2022-11-25 NOTE — Progress Notes (Signed)
Inpatient Rehabilitation Admissions Coordinator   I have received insurance approval for CIR admit at [facility] in [place] for Friday admit. I contacted patient by phone and he is aware and in agreement. I will alert Dr Renae Gloss, acute tem and TOC to make arrangements to admit Friday pending medical clearance by Dr Renae Gloss. I will arrange [facility] transport and [facility] staff to complete transport paperwork that is required. I will follow up in the am with room assignment and Admitting MD for Rehab.  Ottie Glazier, RN, MSN Rehab Admissions Coordinator [phone] 11/25/2022 3:28 PM

## 2022-11-25 NOTE — Plan of Care (Signed)
  Problem: Education: Goal: Knowledge of disease or condition will improve Outcome: Progressing Goal: Knowledge of secondary prevention will improve (MUST DOCUMENT ALL) Outcome: Progressing Goal: Knowledge of patient specific risk factors will improve Corey Chang N/A or DELETE if not current risk factor) Outcome: Progressing   Problem: Ischemic Stroke/TIA Tissue Perfusion: Goal: Complications of ischemic stroke/TIA will be minimized Outcome: Progressing   Problem: Coping: Goal: Will verbalize positive feelings about self Outcome: Progressing Goal: Will identify appropriate support needs Outcome: Progressing   Problem: Health Behavior/Discharge Planning: Goal: Goals will be collaboratively established with patient/family Outcome: Progressing   Problem: Self-Care: Goal: Ability to participate in self-care as condition permits will improve Outcome: Progressing   Problem: Nutrition: Goal: Risk of aspiration will decrease Outcome: Progressing Goal: Dietary intake will improve Outcome: Progressing

## 2022-11-25 NOTE — Evaluation (Signed)
Speech Language Pathology Evaluation Patient Details Name: Corey Chang MRN: [id number] DOB: 1980/12/07 Today's Date: 11/25/2022 Time: 7829-5621 SLP Time Calculation (min) (ACUTE ONLY): 14 min  Problem List:  Patient Active Problem List   Diagnosis Date Noted   Hyponatremia 11/24/2022   Acute CVA (cerebrovascular accident) (HCC) 11/23/2022   Polycythemia 11/23/2022   Elevated LFTs 04/12/2022   Mixed hyperlipidemia 04/12/2022   Acute cough 04/12/2022   Syncope 04/03/2022   Elevated troponin 04/03/2022   Avascular necrosis of bone of hip, left (HCC) 04/03/2022   Allergic rhinitis 04/03/2022   Eustachian tube dysfunction, left 10/06/2021   Bloody diarrhea 11/17/2017   History of oligodendroglioma of brain 03/22/2017   Status post radiation therapy 03/22/2017   GERD (gastroesophageal reflux disease) 11/11/2016   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   Complex partial seizure (HCC) 12/21/2012   Seasonal allergies 07/08/2008   SMOKELESS TOBACCO ABUSE 08/02/2007   Intracranial tumor (HCC) 11/17/1999   Seizure disorder (HCC) 01/16/1997   Past Medical History:  Past Medical History:  Diagnosis Date   Allergic rhinitis 04/22/97   Ankle fracture, right 07/2007   Oligodendroglioma (HCC) 2001   Left temporal; bx in 12/1999.  He had external beam radiation in August 2001 with 54 Gy in 30 fractions on RTOG 9802 protocol.   He declined chemotherapy.    Seizure (HCC)    from history of oligodendroglioma   Past Surgical History:  Past Surgical History:  Procedure Laterality Date   ANKLE SURGERY     right, x 3   brain biopsy  1997, 2001   HPI:  Corey Chang is a 42 y.o. male with medical history significant of oligodendroglioma s/p radiation, seizure disorder, hyperlipidemia, who presents to the ED due to left-sided numbness. 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   Assessment / Plan / Recommendation Clinical Impression  Pt seen for  speech/language/cognitive evaluation. Assessment completed via informal means and SLUMS. Pt scored 27/30 on SLUMS which is [facility]. Pt demonstrated intact basic functional cognitive-linguistic ability. Pt with s/sx mild dysarthria most c/w ataxic dysarthria. Speech is c/b imprecise articulation and irregular rate/rhythm. Speech is largely intelligible with known context; approx 85% intelligible to an unfamiliar listener during informal conversational exchanges. Breakdowns in prosody and articulation are most appreciated at the conversation level. Pt benefited from cues for slowed speech rate and increased vocal loudness. Based on today's assessment, recommend post-acute SLP services for dysarthria. SLP to f/u while pt in house for speech tx. Pt made aware of results, recomemmendations, and SLP POC. Pt verbalized understanding/agreement.    SLP Assessment  SLP Recommendation/Assessment: Patient needs continued Speech Lanaguage Pathology Services SLP Visit Diagnosis: Dysarthria and anarthria (R47.1)    Recommendations for follow up therapy are one component of a multi-disciplinary discharge planning process, led by the attending physician.  Recommendations may be updated based on patient status, additional functional criteria and insurance authorization.    Follow Up Recommendations  Acute inpatient rehab (3hours/day)    Assistance Recommended at Discharge   (defer to OT/PT)  Functional Status Assessment Patient has had a recent decline in their functional status and demonstrates the ability to make significant improvements in function in a reasonable and predictable amount of time.  Frequency and Duration min 2x/week  2 weeks      SLP Evaluation Cognition  Overall Cognitive Status: Within Functional Limits for tasks assessed Orientation Level: Oriented X4 Attention:  (WFL) Memory: Appears intact Awareness: Appears intact Problem Solving: Appears intact Executive Function:  Reasoning;Sequencing;Organizing;Self Monitoring;Self Correcting (clockdrawing task) Reasoning: Appears intact Sequencing: Appears intact Organizing: Appears intact Self Monitoring: Appears intact Self Correcting: Appears intact Safety/Judgment: Appears intact (verbal)       Comprehension  Auditory Comprehension Overall Auditory Comprehension: Appears within functional limits for tasks assessed    Expression Expression Primary Mode of Expression: Verbal Verbal Expression Overall Verbal Expression: Appears within functional limits for tasks assessed   Oral / Motor  Motor Speech Overall Motor Speech: Impaired Respiration: Within functional limits Phonation: Normal Resonance: Within functional limits Articulation: Impaired Level of Impairment:  (across all levels; most appreciated in connected speech) Motor Planning: Witnin functional limits Motor Speech Errors: Not applicable Effective Techniques: Slow rate;Increased vocal intensity           Clyde Canterbury, M.S., CCC-SLP Speech-Language Pathologist [facility] [phone] Arnette Felts)  Woodroe Chen 11/25/2022, 12:14 PM

## 2022-11-25 NOTE — Progress Notes (Signed)
Progress Note   Patient: Corey Chang NWG:[id number] DOB: 07-28-1980 DOA: 11/23/2022     1 DOS: the patient was seen and examined on 11/25/2022   Brief hospital course: 42 y.o. male with medical history significant of oligodendroglioma s/p radiation, seizure disorder, hyperlipidemia, who presents to the ED due to left-sided numbness.   Mr. Giachetti states that yesterday morning when he awoke, he noticed that his left arm seemed numb with tingling and weaker than his right.  In addition, his left leg seemed weak.  He assumed that he had slept on both abnormal.  Then he developed sudden onset dizziness with nausea and vomiting.  He had several episodes of vomiting that were nonbloody, nonmelanotic. He continued to feel unwell throughout the day.  Then today, he notes that he was having difficulty with walking and had to hold onto something to not fall.  Due to this, he came to the ED.  He denies any fever, chills, palpitations, chest pain or shortness of breath.  He denies any prior knowledge of arrhythmias.  5/8.  Echocardiogram shows normal EF and agitated saline study negative.  MRI shows an acute stroke medulla.  Patient on aspirin and Plavix and Lipitor. 5/9.  Patient still weak and numb on the left side.  Will go to acute inpatient rehab on 5/10.  Assessment and Plan: * Acute CVA (cerebrovascular accident) [facility]) Patient is presenting with approximately 36-hour history of left-sided paresthesia and weakness.  MRI showing acute stroke in the medulla.  Patient started on aspirin, Plavix (21 days) and Lipitor.  PT recommending acute inpatient rehab.  Neurology sent off a hypercoagulable workup.  Mixed hyperlipidemia LDL 192, continue high-dose Lipitor.  Polycythemia Hemoglobin down to 16.7.  Will send off a JAK2 mutation analysis and erythropoietin level.  Hyponatremia Sodium 1 point less than the normal range.  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  MRI imaging stable  Seizure disorder (HCC) Continue home Keppra and lamotrigine        Subjective: Patient still has left-sided numbness and weakness.  Patient feels a little bit better than when he came in.  Admitted with stroke to the medulla.  Physical Exam: Vitals:   11/25/22 0159 11/25/22 0557 11/25/22 0752 11/25/22 1150  BP: 119/88 (!) 125/92 (!) 112/95 121/88  Pulse: 61 62 61 (!) 59  Resp: 18 19 16    Temp: (!) 97.5 F (36.4 C) 98 F (36.7 C) 97.8 F (36.6 C) 97.9 F (36.6 C)  TempSrc: Oral Oral Oral Oral  SpO2: 96% 97% 97% 98%  Weight:      Height:       Physical Exam HENT:     Head: Normocephalic.     Mouth/Throat:     Pharynx: No oropharyngeal exudate.  Eyes:     General: Lids are normal.     Conjunctiva/sclera: Conjunctivae normal.  Cardiovascular:     Rate and Rhythm: Normal rate and regular rhythm.     Heart sounds: Normal heart sounds, S1 normal and S2 normal.  Pulmonary:     Breath sounds: No decreased breath sounds, wheezing, rhonchi or rales.  Abdominal:     Palpations: Abdomen is soft.     Tenderness: There is no abdominal tenderness.  Musculoskeletal:     Right lower leg: No swelling.     Left lower leg: No swelling.  Skin:    General: Skin is warm.     Findings: No rash.  Neurological:     Mental Status: He is alert and oriented to person, place, and time.     Comments: Power 4+ out of 5 left arm and leg.  Incoordination with the left hand with rapid finger movements.     Data Reviewed: Sodium 134  Family Communication: Updated patient's wife on the phone  Disposition: Status is: Inpatient Remains inpatient appropriate because: Just got insurance authorization for acute inpatient rehab for tomorrow.  Planned Discharge Destination: Acute inpatient rehab    Time spent: 27 minutes Case discussed with neurology  Author: Alford Highland, MD 11/25/2022 3:58 PM  For on call  review [email or web address].

## 2022-11-25 NOTE — PMR Pre-admission (Signed)
PMR Admission Coordinator Pre-Admission Assessment  Patient: Corey Chang is an 42 y.o., male MRN: [id number] DOB: 11-03-1980 Height: 5\' 11"  (180.3 cm) Weight: 83.7 kg  Insurance Information HMO:     PPO: yes     PCP:      IPA:      80/20:      OTHER:  PRIMARY: [facility] BCBS      Policy#: [id number]      Subscriber: pt CM Name: Deanna      Phone#: [phone]     Fax#: [phone] Pre-Cert#: [id number] approved 5/10 until 12/09/22      Employer: [employer] Benefits:  Phone #: [phone]     Name: 5/9 Eff. Date: 07/19/22     Deduct: $1250      Out of Pocket Max: $4890      Life Max: none CIR: 80%      SNF: 80% 100 days Outpatient: $10 to $52 per visit     Co-Pay: visits per medical neccesity Home Health: 80%      Co-Pay: 20% DME: 80%     Co-Pay: 20% Providers: in network  SECONDARY: none  Financial Counselor:       Phone#:   The Data processing manager" for patients in Inpatient Rehabilitation Facilities with attached "Privacy Act Statement-Health Care Records" was provided and verbally reviewed with: N/A  Emergency Contact Information Contact Information     Name Relation Home Work Mobile   Phung,[name] Spouse [phone]        Current Medical History  Patient Admitting Diagnosis: CVA  History of Present Illness: 42 year old male with history of oligodendroglioma s/p radiation, seizure disorder, HLD who present to [facility] on 11/23/22 due to left sided numbness. He had several episodes of dizziness with nausea and vomiting. Developed into having difficulty walking.   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 Neurology consulted. Started on ASA and Plavix for 3 weeks and Lipitor. Neurology workup sent off hypercoagulable workup. LDL 192. Echo showed normal EF and Bubble study negative. Hgb 16.7. Sent off JAK2 mutation analysis and erythropoietin level.  Continue home Keppra and lamotrigine.  Complete NIHSS TOTAL: 4  Patient's medical record from [facility] has been  reviewed by the rehabilitation admission coordinator and physician.  Past Medical History  Past Medical History:  Diagnosis Date   Allergic rhinitis 04/22/1997   Ankle fracture, right 07/2007   Cholelithiasis    Oligodendroglioma (HCC) 2001   Left temporal; bx in 12/1999.  He had external beam radiation in August 2001 with 54 Gy in 30 fractions on RTOG 9802 protocol.   He declined chemotherapy.    Seizure (HCC)    from history of oligodendroglioma   Has the patient had major surgery during 100 days prior to admission? No  Family History   family history includes Alzheimer's disease in his mother and paternal grandmother; Diabetes in his father; Heart disease in his maternal grandfather; Parkinsonism in his paternal grandmother.  Current Medications  Current Facility-Administered Medications:    acetaminophen (TYLENOL) tablet 650 mg, 650 mg, Oral, Q4H PRN **OR** acetaminophen (TYLENOL) 160 MG/5ML solution 650 mg, 650 mg, Per Tube, Q4H PRN **OR** acetaminophen (TYLENOL) suppository 650 mg, 650 mg, Rectal, Q4H PRN, Verdene Lennert, MD   aspirin EC tablet 81 mg, 81 mg, Oral, Daily, Verdene Lennert, MD, 81 mg at 11/26/22 0902   atorvastatin (LIPITOR) tablet 80 mg, 80 mg, Oral, Daily, Verdene Lennert, MD, 80 mg at 11/25/22 2053   clopidogrel (PLAVIX) tablet 75  mg, 75 mg, Oral, Daily, Verdene Lennert, MD, 75 mg at 11/26/22 0902   enoxaparin (LOVENOX) injection 40 mg, 40 mg, Subcutaneous, Q24H, Verdene Lennert, MD, 40 mg at 11/25/22 2053   lamoTRIgine (LAMICTAL XR) 24 hour tablet 200 mg, 200 mg, Oral, QPM, Verdene Lennert, MD, 200 mg at 11/25/22 1703   levETIRAcetam (KEPPRA) tablet 1,500 mg, 1,500 mg, Oral, BID, Verdene Lennert, MD, 1,500 mg at 11/26/22 0902   senna-docusate (Senokot-S) tablet 1 tablet, 1 tablet, Oral, QHS PRN, Verdene Lennert, MD  Patients Current Diet:  Diet Order             Diet regular Fluid consistency: Thin  Diet effective now                  Precautions /  Restrictions Precautions Precautions: Fall Restrictions Weight Bearing Restrictions: No   Has the patient had 2 or more falls or a fall with injury in the past year? 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 Independent  Indoor Mobility: Did the patient need assistance with walking from room to room (with or without device)? Independent  Stairs: Did the patient need assistance with internal or external stairs (with or without device)? Independent  Functional Cognition: Did the patient need help planning regular tasks such as shopping or remembering to take medications? Independent  Patient Information Are you of Hispanic, Latino/a,or Spanish origin?: A. No, not of Hispanic, Latino/a, or Spanish origin What is your race?: A. White Do you need or want an interpreter to communicate with a doctor or health care staff?: 0. No  Patient's Response To:  Health Literacy and Transportation Is the patient able to respond to health literacy and transportation needs?: Yes Health Literacy - How often do you need to have someone help you when you read instructions, pamphlets, or other written material from your doctor or pharmacy?: Never In the past 12 months, has lack of transportation kept you from medical appointments or from getting medications?: No In the past 12 months, has lack of transportation kept you from meetings, work, or from getting things needed for daily living?: No  Journalist, newspaper / Equipment Home Assistive Devices/Equipment: None Home Equipment: Crutches  Prior Device Use: Indicate devices/aids used by the patient prior to current illness, exacerbation or injury? None of the above  Current Functional Level Cognition  Overall Cognitive Status: Within Functional Limits for tasks assessed Orientation  Level: Oriented X4 General Comments: completed line bisection test with no deficits noted Attention:  [facility]) Memory: Appears intact Awareness: Appears intact Problem Solving: Appears intact Executive Function: Reasoning, Sequencing, Organizing, Self Monitoring, Self Correcting (clockdrawing task) Reasoning: Appears intact Sequencing: Appears intact Organizing: Appears intact Self Monitoring: Appears intact Self Correcting: Appears intact Safety/Judgment: Appears intact (verbal)    Extremity Assessment (includes Sensation/Coordination)  Upper Extremity Assessment: LUE deficits/detail LUE Deficits / Details: Grossly 3+/5, reports numbness/tingling t/o LUE. RAM and FNF impaired, achieves FNF with increased time  Lower Extremity Assessment: Defer to PT evaluation LLE Deficits / Details: L grip weaker than right. L LE weaker in hip flexors but grossly LLE Sensation: decreased light touch LLE Coordination: decreased gross motor    ADLs  Overall ADL's : Needs assistance/impaired General ADL Comments: SUPERVISION don B shoes seated EOB.    Mobility  Overal bed mobility: Modified Independent General bed mobility comments: Semi-supine  to/from sitting (pt using R UE to move L LE)    Transfers  Overall transfer level: Needs assistance Equipment used: None Transfers: Sit to/from Stand Sit to Stand: Min assist General transfer comment: x3 trials standing from bed (assist to steady); vc's for UE placement    Ambulation / Gait / Stairs / Wheelchair Mobility  Ambulation/Gait Ambulation/Gait assistance: Editor, commissioning (Feet): 30 Feet Assistive device: 1 person hand held assist Gait Pattern/deviations: Step-through pattern, Drifts right/left General Gait Details: decreased coordination L LE with advancement; decreased L LE heelstrike; L knee hyperextension noted during R LE advancement (pt unable to correct consistently so deferred further ambulation distance for safety) Gait  velocity: decreased    Posture / Balance Dynamic Sitting Balance Sitting balance - Comments: steady sitting reaching within BOS Static Standing Balance Tandem Stance - Right Leg:  (30 seconds with min assist for balance) Tandem Stance - Left Leg:  (30 seconds with min assist for balance) Rhomberg - Eyes Opened:  (30 seconds with CGA for safety) Rhomberg - Eyes Closed:  (30 seconds with min assist for balance (pt tending to lean towards L side)) Balance Overall balance assessment: Needs assistance Sitting-balance support: No upper extremity supported, Feet supported Sitting balance-Leahy Scale: Good Sitting balance - Comments: steady sitting reaching within BOS Standing balance support: Single extremity supported, During functional activity Standing balance-Leahy Scale: Poor Standing balance comment: assist for balance during ambulation Tandem Stance - Right Leg:  (30 seconds with min assist for balance) Tandem Stance - Left Leg:  (30 seconds with min assist for balance) Rhomberg - Eyes Opened:  (30 seconds with CGA for safety) Rhomberg - Eyes Closed:  (30 seconds with min assist for balance (pt tending to lean towards L side))    Special needs/care consideration    Previous Home Environment  Living Arrangements: Children (and 2 children 9 and 12)  Lives With: Spouse (2 childrens) Available Help at Discharge: Family, Available 24 hours/day Type of Home: House Home Layout: Two level, Able to live on main level with bedroom/bathroom Alternate Level Stairs-Rails: Right Home Access: Stairs to enter Entrance Stairs-Rails: None Entrance Stairs-Number of Steps: 2 Bathroom Shower/Tub: Health visitor: Standard Bathroom Accessibility: Yes How Accessible: Accessible via walker Home Care Services: No Additional Comments: pt lives with wife (who works from home) and 2 children. 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  Discharge Living Setting Plans for Discharge Living Setting:  Patient's home, Lives with (comment) (wife and 2 children) Type of Home at Discharge: House Discharge Home Layout: Two level, Able to live on main level with bedroom/bathroom Alternate Level Stairs-Rails: Right Alternate Level Stairs-Number of Steps: flight Discharge Home Access: Stairs to enter Entrance Stairs-Rails: None Entrance Stairs-Number of Steps: 2 Discharge Bathroom Shower/Tub: Garment/textile technologist: Standard Discharge Bathroom Accessibility: Yes How Accessible: Accessible via walker Does the patient have any problems obtaining your medications?: No  Social/Family/Support Systems Patient Roles: Spouse, Parent (employee) Contact Information: wife, Magazine features editor Anticipated Caregiver: wife Anticipated Industrial/product designer Information: see contacts Ability/Limitations of Caregiver: none Caregiver Availability: 24/7 Discharge Plan Discussed with Primary Caregiver: Yes Is Caregiver In Agreement with Plan?: Yes Does Caregiver/Family have Issues with Lodging/Transportation while Pt is in Rehab?: No  Goals Patient/Family Goal for Rehab: Mod I to supervision with PT, OT  and SLP Expected length of stay: ELOS 7 days Pt/Family Agrees to Admission and willing to participate: Yes Program Orientation Provided & Reviewed with Pt/Caregiver Including Roles  & Responsibilities: Yes  Decrease burden  of Care through IP rehab admission: n/a  Possible need for SNF placement upon discharge: not anticipated  Patient Condition: I have reviewed medical records from [facility], spoken with  patient and spouse. I met with patient at the bedside for inpatient rehabilitation assessment.  Patient will benefit from ongoing PT, OT, and SLP, can actively participate in 3 hours of therapy a day 5 days of the week, and can make measurable gains during the admission.  Patient will also benefit from the coordinated team approach during an Inpatient Acute Rehabilitation admission.  The patient will receive  intensive therapy as well as Rehabilitation physician, nursing, social worker, and care management interventions.  Due to bladder management, bowel management, safety, skin/wound care, disease management, medication administration, pain management, and patient education the patient requires 24 hour a day rehabilitation nursing.  The patient is currently min assist overall with mobility and basic ADLs.  Discharge setting and therapy post discharge at home with outpatient is anticipated.  Patient has agreed to participate in the Acute Inpatient Rehabilitation Program and will admit today.  Preadmission Screen Completed By:  Clois Dupes, RN MSN 11/26/2022 10:17 AM ______________________________________________________________________   Discussed status with Dr. Shearon Stalls on 11/26/22 at 1018 and received approval for admission today.  Admission Coordinator:  Clois Dupes, RN MSN time 1018 Date 11/26/22   Assessment/Plan: Diagnosis: Does the need for close, 24 hr/day Medical supervision in concert with the patient's rehab needs make it unreasonable for this patient to be served in a less intensive setting? Yes Co-Morbidities requiring supervision/potential complications: Hyponatremia, polycythemia, Hx oligodendroma s/p radiation therapy, seizure disorder Due to safety, disease management, medication administration, and patient education, does the patient require 24 hr/day rehab nursing? Yes Does the patient require coordinated care of a physician, rehab nurse, PT, OT, and SLP to address physical and functional deficits in the context of the above medical diagnosis(es)? Yes Addressing deficits in the following areas: balance, endurance, locomotion, strength, transferring, bathing, dressing, feeding, grooming, toileting, cognition, and speech Can the patient actively participate in an intensive therapy program of at least 3 hrs of therapy 5 days a week? Yes The potential for patient to make  measurable gains while on inpatient rehab is excellent Anticipated functional outcomes upon discharge from inpatient rehab: supervision PT, supervision OT, modified independent SLP Estimated rehab length of stay to reach the above functional goals is: 7-10 days Anticipated discharge destination: Home 10. Overall Rehab/Functional Prognosis: excellent   MD Signature:  Angelina Sheriff, DO 11/26/2022

## 2022-11-25 NOTE — Plan of Care (Addendum)
Please see neurology consult note for full findings and recommendations.  - Hypercoag workup ordered, will result after discharge and should be followed up on by PCP and outpatient neurologist - Patient should have ambulatory cardiac monitoring after discharge - ASA 81mg  daily + plavix 75mg  daily x21 days f/b ASA 81mg  daily monotherapy after that - Atorvastatin 80mg  daily - PT/OT/SLP - Patient may f/u with established outpatient neurologist Dr. Karel Jarvis  Neurology to sign off but please re-engage if any additional neurologic concerns arise.  Bing Neighbors, MD [facility] [phone]  If 7pm- 7am, please page neurology on call as listed in AMION.

## 2022-11-25 NOTE — Progress Notes (Signed)
Physical Therapy Treatment Patient Details Name: Corey Chang MRN: [id number] DOB: 11/03/1980 Today's Date: 11/25/2022   History of Present Illness Corey Chang is a 42 y.o. male with medical history significant of oligodendroglioma s/p radiation, seizure disorder, hyperlipidemia, who presents to the ED due to left-sided numbness. 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    PT Comments    Pt resting in bed upon PT arrival; agreeable to therapy; pt's wife present.  During session pt modified independent with bed mobility; min assist with transfers; and min assist to ambulate 30 feet with hand hold assist (therapist limited distance d/t L knee hyperextension during R LE advancement and balance concerns; pt also reporting concerns that L knee would buckle).  Also worked on standing balance and LE ex's during session (L LE weakness and impaired L quadriceps eccentric control noted).  Pt continues to be motivated to participate in therapy and does well tolerating sessions activities.   Recommendations for follow up therapy are one component of a multi-disciplinary discharge planning process, led by the attending physician.  Recommendations may be updated based on patient status, additional functional criteria and insurance authorization.        Assistance Recommended at Discharge Intermittent Supervision/Assistance  Patient can return home with the following A little help with walking and/or transfers;A little help with bathing/dressing/bathroom;Assist for transportation;Help with stairs or ramp for entrance;Assistance with Engineer, mining    Recommendations for Other Services Rehab consult     Precautions / Restrictions Precautions Precautions: Fall Restrictions Weight Bearing Restrictions: No     Mobility  Bed Mobility Overal bed mobility: Modified Independent             General bed mobility comments: Semi-supine to/from  sitting (pt using R UE to move L LE)    Transfers Overall transfer level: Needs assistance Equipment used: None Transfers: Sit to/from Stand Sit to Stand: Min assist           General transfer comment: x3 trials standing from bed (assist to steady); vc's for UE placement    Ambulation/Gait Ambulation/Gait assistance: Min assist Gait Distance (Feet): 30 Feet Assistive device: 1 person hand held assist   Gait velocity: decreased     General Gait Details: decreased coordination L LE with advancement; decreased L LE heelstrike; L knee hyperextension noted during R LE advancement (pt unable to correct consistently so deferred further ambulation distance for safety)   Stairs             Wheelchair Mobility    Modified Rankin (Stroke Patients Only)       Balance Overall balance assessment: Needs assistance Sitting-balance support: No upper extremity supported, Feet supported Sitting balance-Leahy Scale: Good Sitting balance - Comments: steady sitting reaching within BOS   Standing balance support: Single extremity supported, During functional activity Standing balance-Leahy Scale: Poor Standing balance comment: assist for balance during ambulation     Tandem Stance - Right Leg:  (30 seconds with min assist for balance) Tandem Stance - Left Leg:  (30 seconds with min assist for balance) Rhomberg - Eyes Opened:  (30 seconds with CGA for safety) Rhomberg - Eyes Closed:  (30 seconds with min assist for balance (pt tending to lean towards L side))                Cognition Arousal/Alertness: Awake/alert Behavior During Therapy: WFL for tasks assessed/performed, Impulsive Overall Cognitive Status: Within Functional Limits for tasks assessed  Exercises General Exercises - Lower Extremity Ankle Circles/Pumps: AROM, Strengthening, Left, 10 reps, Seated Quad Sets: AROM, Strengthening, Left, 10 reps,  Standing (focusing on not hyperextending L knee in standing) Short Arc Quad: AROM, Strengthening, Left, 10 reps, Supine (impaired eccentric control noted) Long Arc Quad: AROM, Strengthening, Left, 10 reps, Seated (impaired eccentric control noted) Heel Slides: AAROM, Strengthening, Left, 10 reps, Supine (vc's and assist for positioning (pt tending to externally rotate at hip)) Hip ABduction/ADduction: AAROM, Strengthening, Left, 10 reps, Supine Straight Leg Raises: AAROM, Strengthening, Left, 10 reps, Supine    General Comments  Pt agreeable to PT session.      Pertinent Vitals/Pain Pain Assessment Pain Assessment: Faces Faces Pain Scale: Hurts little more Pain Location: low back pain Pain Descriptors / Indicators: Sore Pain Intervention(s): Limited activity within patient's tolerance, Monitored during session, Repositioned Vitals (HR and O2 on room air) stable and WFL throughout treatment session.    Home Living     Available Help at Discharge: Family;Available 24 hours/day Type of Home: House                  Prior Function            PT Goals (current goals can now be found in the care plan section) Acute Rehab PT Goals Patient Stated Goal: to return to work PT Goal Formulation: With patient Time For Goal Achievement: 12/08/22 Potential to Achieve Goals: Fair Progress towards PT goals: Progressing toward goals    Frequency    7X/week      PT Plan Current plan remains appropriate    Co-evaluation              AM-PAC PT "6 Clicks" Mobility   Outcome Measure  Help needed turning from your back to your side while in a flat bed without using bedrails?: None Help needed moving from lying on your back to sitting on the side of a flat bed without using bedrails?: None Help needed moving to and from a bed to a chair (including a wheelchair)?: A Little Help needed standing up from a chair using your arms (e.g., wheelchair or bedside chair)?: A  Little Help needed to walk in hospital room?: A Little Help needed climbing 3-5 steps with a railing? : A Lot 6 Click Score: 19    End of Session Equipment Utilized During Treatment: Gait belt Activity Tolerance: Patient tolerated treatment well Patient left: in chair;with call bell/phone within reach;with chair alarm set;with family/visitor present Nurse Communication: Mobility status;Precautions PT Visit Diagnosis: Unsteadiness on feet (R26.81);Other abnormalities of gait and mobility (R26.89);Muscle weakness (generalized) (M62.81);Difficulty in walking, not elsewhere classified (R26.2);Hemiplegia and hemiparesis Hemiplegia - Right/Left: Left Hemiplegia - dominant/non-dominant: Non-dominant Hemiplegia - caused by: Cerebral infarction     Time: 2956-2130 PT Time Calculation (min) (ACUTE ONLY): 43 min  Charges:  $Gait Training: 8-22 mins $Therapeutic Exercise: 8-22 mins $Therapeutic Activity: 8-22 mins                     Hendricks Limes, PT 11/25/22, 3:12 PM

## 2022-11-25 NOTE — Progress Notes (Signed)
Occupational Therapy Treatment Patient Details Name: Corey Chang MRN: [id number] DOB: Jan 26, 1981 Today's Date: 11/25/2022   History of present illness Corey Chang is a 42 y.o. male with medical history significant of oligodendroglioma s/p radiation, seizure disorder, hyperlipidemia, who presents to the ED due to left-sided numbness. 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   OT comments  Corey Chang was seen for OT treatment on this date. Upon arrival to room pt reclined in bed, agreeable to tx. Pt completes [facility] dexterity task placing beads from palm into container. Reviewed theraputty HEP. Card sorting task with MIN A to prevent L shoulder hiking and increased time to sort by color. Completed line bisection test with no deficits noted. SLP in for assessment, left seated EOB with SLP. Pt making good progress toward goals, will continue to follow POC. Discharge recommendation remains appropriate.     Recommendations for follow up therapy are one component of a multi-disciplinary discharge planning process, led by the attending physician.  Recommendations may be updated based on patient status, additional functional criteria and insurance authorization.    Assistance Recommended at Discharge Intermittent Supervision/Assistance  Patient can return home with the following  A lot of help with walking and/or transfers;A lot of help with bathing/dressing/bathroom;Help with stairs or ramp for entrance   Equipment Recommendations  BSC/3in1    Recommendations for Other Services Rehab consult    Precautions / Restrictions Precautions Precautions: Fall Restrictions Weight Bearing Restrictions: No       Mobility Bed Mobility Overal bed mobility: Independent                  Transfers                   General transfer comment: pt defferred as SLP arrived     Balance Overall balance assessment: Needs assistance Sitting-balance support: Feet supported Sitting  balance-Leahy Scale: Good                                     ADL either performed or assessed with clinical judgement   ADL Overall ADL's : Needs assistance/impaired                                       General ADL Comments: SUPERVISION don B shoes seated EOB.      Cognition Arousal/Alertness: Awake/alert Behavior During Therapy: WFL for tasks assessed/performed, Impulsive Overall Cognitive Status: Within Functional Limits for tasks assessed                                 General Comments: completed line bisection test with no deficits noted        Exercises Other Exercises Other Exercises: [facility] dexterity task placing beads from palm into container. Card sorting task with MIN A to prevent L shoulder hiking and increased time to sort by color.            Pertinent Vitals/ Pain       Pain Assessment Pain Assessment: No/denies pain  Home Living     Available Help at Discharge: Family;Available 24 hours/day Type of Home: House  Lives With: Spouse (2 childrens)        Frequency  Min 3X/week        Progress Toward Goals  OT Goals(current goals can now be found in the care plan section)  Progress towards OT goals: Progressing toward goals  Acute Rehab OT Goals Patient Stated Goal: to go to CIR OT Goal Formulation: With patient Time For Goal Achievement: 12/08/22 Potential to Achieve Goals: Good ADL Goals Pt Will Perform Grooming: standing;with supervision Pt Will Perform Lower Body Dressing: with modified independence;sit to/from stand Pt Will Transfer to Toilet: Independently;ambulating;regular height toilet Pt/caregiver will Perform Home Exercise Program: Increased strength;Increased ROM;Left upper extremity;Independently;With theraputty  Plan Discharge plan remains appropriate;Frequency remains appropriate    Co-evaluation                 AM-PAC OT "6  Clicks" Daily Activity     Outcome Measure   Help from another person eating meals?: None Help from another person taking care of personal grooming?: A Little Help from another person toileting, which includes using toliet, bedpan, or urinal?: A Little Help from another person bathing (including washing, rinsing, drying)?: A Little Help from another person to put on and taking off regular upper body clothing?: A Little Help from another person to put on and taking off regular lower body clothing?: A Little 6 Click Score: 19    End of Session    OT Visit Diagnosis: Other abnormalities of gait and mobility (R26.89);Muscle weakness (generalized) (M62.81)   Activity Tolerance Patient tolerated treatment well   Patient Left in bed;with call bell/phone within reach;with bed alarm set (SLP in room)   Nurse Communication          Time: 9811-9147 OT Time Calculation (min): 17 min  Charges: OT General Charges $OT Visit: 1 Visit OT Treatments $Therapeutic Activity: 8-22 mins  Kathie Dike, M.S. OTR/L  11/25/22, 1:40 PM  ascom [phone]

## 2022-11-26 ENCOUNTER — Encounter (HOSPITAL_COMMUNITY): Payer: Self-pay | Admitting: Physical Medicine and Rehabilitation

## 2022-11-26 ENCOUNTER — Inpatient Hospital Stay (HOSPITAL_COMMUNITY)
Admission: RE | Admit: 2022-11-26 | Discharge: 2022-12-03 | DRG: 057 | Disposition: A | Discharge status: Home / Self Care | Payer: BC Managed Care – PPO | Source: Intra-hospital | Attending: Physical Medicine & Rehabilitation | Admitting: Physical Medicine & Rehabilitation

## 2022-11-26 ENCOUNTER — Other Ambulatory Visit: Payer: Self-pay

## 2022-11-26 ENCOUNTER — Encounter (HOSPITAL_COMMUNITY): Payer: Self-pay

## 2022-11-26 DIAGNOSIS — I639 Cerebral infarction, unspecified: Secondary | ICD-10-CM | POA: Diagnosis present

## 2022-11-26 DIAGNOSIS — C712 Malignant neoplasm of temporal lobe: Secondary | ICD-10-CM | POA: Diagnosis not present

## 2022-11-26 DIAGNOSIS — E785 Hyperlipidemia, unspecified: Secondary | ICD-10-CM | POA: Diagnosis present

## 2022-11-26 DIAGNOSIS — D751 Secondary polycythemia: Secondary | ICD-10-CM | POA: Diagnosis present

## 2022-11-26 DIAGNOSIS — R569 Unspecified convulsions: Secondary | ICD-10-CM | POA: Diagnosis present

## 2022-11-26 DIAGNOSIS — Z923 Personal history of irradiation: Secondary | ICD-10-CM

## 2022-11-26 DIAGNOSIS — Z888 Allergy status to other drugs, medicaments and biological substances status: Secondary | ICD-10-CM

## 2022-11-26 DIAGNOSIS — M25362 Other instability, left knee: Secondary | ICD-10-CM | POA: Diagnosis present

## 2022-11-26 DIAGNOSIS — Z882 Allergy status to sulfonamides status: Secondary | ICD-10-CM | POA: Diagnosis not present

## 2022-11-26 DIAGNOSIS — I69354 Hemiplegia and hemiparesis following cerebral infarction affecting left non-dominant side: Secondary | ICD-10-CM | POA: Diagnosis present

## 2022-11-26 DIAGNOSIS — Z791 Long term (current) use of non-steroidal anti-inflammatories (NSAID): Secondary | ICD-10-CM

## 2022-11-26 DIAGNOSIS — R278 Other lack of coordination: Secondary | ICD-10-CM | POA: Diagnosis present

## 2022-11-26 DIAGNOSIS — H5509 Other forms of nystagmus: Secondary | ICD-10-CM | POA: Diagnosis present

## 2022-11-26 DIAGNOSIS — R1032 Left lower quadrant pain: Secondary | ICD-10-CM | POA: Diagnosis present

## 2022-11-26 DIAGNOSIS — J309 Allergic rhinitis, unspecified: Secondary | ICD-10-CM | POA: Diagnosis present

## 2022-11-26 DIAGNOSIS — E871 Hypo-osmolality and hyponatremia: Secondary | ICD-10-CM | POA: Diagnosis present

## 2022-11-26 DIAGNOSIS — G4700 Insomnia, unspecified: Secondary | ICD-10-CM | POA: Diagnosis present

## 2022-11-26 DIAGNOSIS — R197 Diarrhea, unspecified: Secondary | ICD-10-CM | POA: Diagnosis present

## 2022-11-26 DIAGNOSIS — F17290 Nicotine dependence, other tobacco product, uncomplicated: Secondary | ICD-10-CM | POA: Diagnosis present

## 2022-11-26 DIAGNOSIS — K219 Gastro-esophageal reflux disease without esophagitis: Secondary | ICD-10-CM | POA: Diagnosis present

## 2022-11-26 DIAGNOSIS — I69322 Dysarthria following cerebral infarction: Secondary | ICD-10-CM

## 2022-11-26 DIAGNOSIS — Z85841 Personal history of malignant neoplasm of brain: Secondary | ICD-10-CM

## 2022-11-26 DIAGNOSIS — I69392 Facial weakness following cerebral infarction: Secondary | ICD-10-CM

## 2022-11-26 DIAGNOSIS — I739 Peripheral vascular disease, unspecified: Secondary | ICD-10-CM | POA: Diagnosis present

## 2022-11-26 DIAGNOSIS — Z79899 Other long term (current) drug therapy: Secondary | ICD-10-CM

## 2022-11-26 DIAGNOSIS — F17211 Nicotine dependence, cigarettes, in remission: Secondary | ICD-10-CM | POA: Diagnosis not present

## 2022-11-26 DIAGNOSIS — G40909 Epilepsy, unspecified, not intractable, without status epilepticus: Secondary | ICD-10-CM

## 2022-11-26 DIAGNOSIS — F17220 Nicotine dependence, chewing tobacco, uncomplicated: Secondary | ICD-10-CM

## 2022-11-26 LAB — BASIC METABOLIC PANEL
Anion gap: 8 (ref 5–15)
BUN: 14 mg/dL (ref 6–20)
CO2: 22 mmol/L (ref 22–32)
Calcium: 9.7 mg/dL (ref 8.9–10.3)
Chloride: 103 mmol/L (ref 98–111)
Creatinine, Ser: 0.94 mg/dL (ref 0.61–1.24)
GFR, Estimated: >60 mL/min (ref >60)
Glucose, Bld: 97 mg/dL (ref 70–99)
Potassium: 3.5 mmol/L (ref 3.5–5.1)
Sodium: 133 mmol/L — ABNORMAL LOW (ref 135–145)

## 2022-11-26 LAB — BETA-2-GLYCOPROTEIN I ABS, IGG/M/A
Beta-2 Glyco I IgG: <9 GPI IgG units (ref 0–20)
Beta-2-Glycoprotein I IgA: <9 GPI IgA units (ref 0–25)
Beta-2-Glycoprotein I IgM: <9 GPI IgM units (ref 0–32)

## 2022-11-26 LAB — HEMOGLOBIN: Hemoglobin: 18.3 g/dL — ABNORMAL HIGH (ref 13.0–17.0)

## 2022-11-26 LAB — CARDIOLIPIN ANTIBODIES, IGG, IGM, IGA
Anticardiolipin IgA: <9 APL U/mL (ref 0–11)
Anticardiolipin IgG: <9 GPL U/mL (ref 0–14)
Anticardiolipin IgM: 34 MPL U/mL — ABNORMAL HIGH (ref 0–12)

## 2022-11-26 LAB — HOMOCYSTEINE: Homocysteine: 24.7 umol/L — ABNORMAL HIGH (ref 0.0–14.5)

## 2022-11-26 MED ORDER — ENOXAPARIN SODIUM 40 MG/0.4ML IJ SOSY
40.0000 mg | PREFILLED_SYRINGE | INTRAMUSCULAR | Status: DC
  Administered 2022-11-26 – 2022-12-02 (×7): 40 mg via SUBCUTANEOUS
  Filled 2022-11-26 (×7): qty 0.4

## 2022-11-26 MED ORDER — LAMOTRIGINE ER 200 MG PO TB24: 200.0000 mg | ORAL_TABLET | Freq: Every evening | ORAL | 0 refills | Status: DC

## 2022-11-26 MED ORDER — DIPHENHYDRAMINE HCL 25 MG PO CAPS: 25.0000 mg | ORAL_CAPSULE | Freq: Four times a day (QID) | ORAL | Status: DC | PRN

## 2022-11-26 MED ORDER — ACETAMINOPHEN 325 MG PO TABS: 650.0000 mg | ORAL_TABLET | ORAL | Status: DC | PRN

## 2022-11-26 MED ORDER — POLYETHYLENE GLYCOL 3350 17 G PO PACK: 17.0000 g | PACK | Freq: Every day | ORAL | 0 refills | Status: DC

## 2022-11-26 MED ORDER — PROCHLORPERAZINE MALEATE 5 MG PO TABS: 5.0000 mg | ORAL_TABLET | Freq: Four times a day (QID) | ORAL | Status: DC | PRN

## 2022-11-26 MED ORDER — PROCHLORPERAZINE EDISYLATE 10 MG/2ML IJ SOLN: 5.0000 mg | Freq: Four times a day (QID) | INTRAMUSCULAR | Status: DC | PRN

## 2022-11-26 MED ORDER — TRAZODONE HCL 50 MG PO TABS
25.0000 mg | ORAL_TABLET | Freq: Every evening | ORAL | Status: DC | PRN
  Filled 2022-11-26: qty 1

## 2022-11-26 MED ORDER — ACETAMINOPHEN 325 MG PO TABS: 325.0000 mg | ORAL_TABLET | ORAL | Status: DC | PRN

## 2022-11-26 MED ORDER — ALUM & MAG HYDROXIDE-SIMETH 200-200-20 MG/5ML PO SUSP: 30.0000 mL | ORAL | Status: DC | PRN

## 2022-11-26 MED ORDER — PANTOPRAZOLE SODIUM 40 MG PO TBEC
40.0000 mg | DELAYED_RELEASE_TABLET | Freq: Every day | ORAL | Status: DC
  Administered 2022-11-26 – 2022-12-03 (×8): 40 mg via ORAL
  Filled 2022-11-26 (×8): qty 1

## 2022-11-26 MED ORDER — PROCHLORPERAZINE 25 MG RE SUPP: 12.5000 mg | Freq: Four times a day (QID) | RECTAL | Status: DC | PRN

## 2022-11-26 MED ORDER — LEVETIRACETAM 750 MG PO TABS
1500.0000 mg | ORAL_TABLET | Freq: Two times a day (BID) | ORAL | Status: DC
  Administered 2022-11-26 – 2022-12-03 (×14): 1500 mg via ORAL
  Filled 2022-11-26 (×14): qty 2

## 2022-11-26 MED ORDER — INSULIN ASPART 100 UNIT/ML IJ SOLN: 0.0000 [IU] | Freq: Three times a day (TID) | INTRAMUSCULAR | Status: DC

## 2022-11-26 MED ORDER — ASPIRIN 81 MG PO TBEC
81.0000 mg | DELAYED_RELEASE_TABLET | Freq: Every day | ORAL | Status: DC
  Administered 2022-11-27 – 2022-12-03 (×7): 81 mg via ORAL
  Filled 2022-11-26 (×7): qty 1

## 2022-11-26 MED ORDER — POLYETHYLENE GLYCOL 3350 17 G PO PACK: 17.0000 g | PACK | Freq: Every day | ORAL | Status: DC | PRN

## 2022-11-26 MED ORDER — ALBUTEROL SULFATE (2.5 MG/3ML) 0.083% IN NEBU: 3.0000 mL | INHALATION_SOLUTION | Freq: Four times a day (QID) | RESPIRATORY_TRACT | Status: DC | PRN

## 2022-11-26 MED ORDER — LAMOTRIGINE ER 100 MG PO TB24
200.0000 mg | ORAL_TABLET | Freq: Every evening | ORAL | Status: DC
  Administered 2022-11-26 – 2022-12-02 (×7): 200 mg via ORAL
  Filled 2022-11-26 (×8): qty 2

## 2022-11-26 MED ORDER — LORATADINE 10 MG PO TABS
10.0000 mg | ORAL_TABLET | Freq: Every day | ORAL | Status: DC
  Administered 2022-11-26 – 2022-12-03 (×8): 10 mg via ORAL
  Filled 2022-11-26 (×8): qty 1

## 2022-11-26 MED ORDER — CLOPIDOGREL BISULFATE 75 MG PO TABS
75.0000 mg | ORAL_TABLET | Freq: Every day | ORAL | Status: DC
  Administered 2022-11-27 – 2022-12-03 (×7): 75 mg via ORAL
  Filled 2022-11-26 (×7): qty 1

## 2022-11-26 MED ORDER — ATORVASTATIN CALCIUM 80 MG PO TABS: 80.0000 mg | ORAL_TABLET | Freq: Every day | ORAL | 0 refills | Status: DC

## 2022-11-26 MED ORDER — POLYETHYLENE GLYCOL 3350 17 G PO PACK: 17.0000 g | PACK | Freq: Every day | ORAL | Status: DC

## 2022-11-26 MED ORDER — LOPERAMIDE HCL 2 MG PO CAPS
2.0000 mg | ORAL_CAPSULE | Freq: Once | ORAL | Status: AC
  Administered 2022-11-26: 2 mg via ORAL
  Filled 2022-11-26: qty 1

## 2022-11-26 MED ORDER — ATORVASTATIN CALCIUM 80 MG PO TABS
80.0000 mg | ORAL_TABLET | Freq: Every day | ORAL | Status: DC
  Administered 2022-11-26 – 2022-12-03 (×8): 80 mg via ORAL
  Filled 2022-11-26 (×8): qty 1

## 2022-11-26 MED ORDER — PANTOPRAZOLE SODIUM 40 MG PO TBEC: 40.0000 mg | DELAYED_RELEASE_TABLET | Freq: Every day | ORAL | 0 refills | Status: DC

## 2022-11-26 MED ORDER — GUAIFENESIN-DM 100-10 MG/5ML PO SYRP: 5.0000 mL | ORAL_SOLUTION | Freq: Four times a day (QID) | ORAL | Status: DC | PRN

## 2022-11-26 MED ORDER — FLEET ENEMA 7-19 GM/118ML RE ENEM: 1.0000 | ENEMA | Freq: Once | RECTAL | Status: DC | PRN

## 2022-11-26 MED ORDER — CLOPIDOGREL BISULFATE 75 MG PO TABS: 75.0000 mg | ORAL_TABLET | Freq: Every day | ORAL | 0 refills | Status: DC

## 2022-11-26 MED ORDER — ASPIRIN 81 MG PO TBEC: 81.0000 mg | DELAYED_RELEASE_TABLET | Freq: Every day | ORAL | 0 refills | Status: DC

## 2022-11-26 MED ORDER — BISACODYL 10 MG RE SUPP: 10.0000 mg | Freq: Every day | RECTAL | Status: DC | PRN

## 2022-11-26 NOTE — Progress Notes (Signed)
Inpatient Rehabilitation Admission Medication Review by a Pharmacist  A complete drug regimen review was completed for this patient to identify any potential clinically significant medication issues.  High Risk Drug Classes Is patient taking? Indication by Medication  Antipsychotic Yes Compazine - N/V  Anticoagulant Yes Lovenox - DVT px  Antibiotic No   Opioid No   Antiplatelet Yes ASA/plavix - CVA  Hypoglycemics/insulin Yes SSI - DM  Vasoactive Medication No   Chemotherapy No   Other Yes Lipitor - HLD Albuterol - wheezing Keppra/lamictal - sz Claritin - allergy Protonix - GERD Trazodone - sleep     Type of Medication Issue Identified Description of Issue Recommendation(s)  Drug Interaction(s) (clinically significant)     Duplicate Therapy     Allergy     No Medication Administration End Date     Incorrect Dose     Additional Drug Therapy Needed     Significant med changes from prior encounter (inform family/care partners about these prior to discharge).    Other       Clinically significant medication issues were identified that warrant physician communication and completion of prescribed/recommended actions by midnight of the next day:  No  Name of provider notified for urgent issues identified:   Provider Method of Notification:     Pharmacist comments:   Time spent performing this drug regimen review (minutes):  20   Ulyses Southward, PharmD, [place], AAHIVP, CPP Infectious Disease Pharmacist 11/26/2022 2:17 PM

## 2022-11-26 NOTE — Discharge Summary (Signed)
Physician Discharge Summary   Patient: Corey Chang MRN: [id number] DOB: January 06, 1981  Admit date:     11/23/2022  Discharge date: 11/26/22  Discharge Physician: Alford Highland   PCP: Gweneth Dimitri, MD   Recommendations at discharge:   Follow-up with team at acute inpatient rehab Follow-up hypercoagulable workup Follow-up JAK2 mutation and erythropoietin level  Discharge Diagnoses: Principal Problem:   Acute CVA (cerebrovascular accident) [facility]) Active Problems:   Mixed hyperlipidemia   Polycythemia   Seizure disorder (HCC)   Oligodendroglioma of temporal lobe (HCC)   Hyponatremia    Hospital Course: 42 y.o. male with medical history significant of oligodendroglioma s/p radiation, seizure disorder, hyperlipidemia, who presents to the ED due to left-sided numbness.   Corey Chang states that yesterday morning when he awoke, he noticed that his left arm seemed numb with tingling and weaker than his right.  In addition, his left leg seemed weak.  He assumed that he had slept on both abnormal.  Then he developed sudden onset dizziness with nausea and vomiting.  He had several episodes of vomiting that were nonbloody, nonmelanotic. He continued to feel unwell throughout the day.  Then today, he notes that he was having difficulty with walking and had to hold onto something to not fall.  Due to this, he came to the ED.  He denies any fever, chills, palpitations, chest pain or shortness of breath.  He denies any prior knowledge of arrhythmias. Mri showing acute stroke in medulla.  5/8.  Echocardiogram shows normal EF and agitated saline study negative.  MRI shows an acute stroke medulla.  Patient on aspirin and Plavix and Lipitor. 5/9.  Patient still weak and numb on the left side.  Will go to acute inpatient rehab on 5/10. 5/10.  Patient stable to go out to acute inpatient rehab.  Assessment and Plan: * Acute CVA (cerebrovascular accident) [facility]) Patient is presenting with approximately  36-hour history of left-sided paresthesia and weakness.  MRI showing acute stroke in the medulla.  Continue on aspirin (lifelong), Plavix (another 18 days) and Lipitor (lifelong).  PT recommending acute inpatient rehab.  Neurology sent off a hypercoagulable workup.  Stable to go out to rehab today.  Mixed hyperlipidemia LDL 192, continue high-dose Lipitor.  Polycythemia Hemoglobin down to 18.3.  Follow JAK2 mutation analysis and erythropoietin level for work up of polycythemia.    Hyponatremia Sodium 2 points less than the normal range.  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  MRI imaging stable  Seizure disorder (HCC) Continue home Keppra and lamotrigine         Consultants: Neurology Procedures performed: None Disposition: Acute inpatient rehab Diet recommendation:  Cardiac diet DISCHARGE MEDICATION: Allergies as of 11/26/2022       Reactions   Carbatrol [carbamazepine]    Not sure but may have caused a rash   Sulfa Antibiotics Rash        Medication List     STOP taking these medications    ALEVE PO   esomeprazole 40 MG capsule Commonly known as: NEXIUM   rosuvastatin 20 MG tablet Commonly known as: CRESTOR       TAKE these medications    acetaminophen 325 MG tablet Commonly known as: TYLENOL Take 2 tablets (650 mg total) by mouth every 4 (four) hours as needed for mild pain (or temp > 37.5 C (99.5 F)).   albuterol 108 (90 Base) MCG/ACT inhaler Commonly known as: VENTOLIN HFA Inhale  2 puffs into the lungs every 6 (six) hours as needed for wheezing or shortness of breath.   aspirin EC 81 MG tablet Take 1 tablet (81 mg total) by mouth daily. Swallow whole.   atorvastatin 80 MG tablet Commonly known as: LIPITOR Take 1 tablet (80 mg total) by mouth daily.   clopidogrel 75 MG tablet Commonly known as: PLAVIX Take 1 tablet (75 mg total) by mouth daily for 18  days. Start taking on: Nov 27, 2022   LamoTRIgine 200 MG Tb24 24 hour tablet Take 1 tablet (200 mg total) by mouth every evening. What changed:  medication strength how much to take how to take this when to take this additional instructions   levETIRAcetam 500 MG tablet Commonly known as: KEPPRA Take 3 tablets (1,500 mg total) by mouth 2 (two) times daily.   loratadine 10 MG tablet Commonly known as: CLARITIN Take 10 mg by mouth daily.   pantoprazole 40 MG tablet Commonly known as: Protonix Take 1 tablet (40 mg total) by mouth daily.   polyethylene glycol 17 g packet Commonly known as: MIRALAX / GLYCOLAX Take 17 g by mouth daily.        Discharge Exam: Filed Weights   11/23/22 1930 11/24/22 0500  Weight: 88.7 kg 83.7 kg   Physical Exam HENT:     Head: Normocephalic.     Mouth/Throat:     Pharynx: No oropharyngeal exudate.  Eyes:     General: Lids are normal.     Conjunctiva/sclera: Conjunctivae normal.  Cardiovascular:     Rate and Rhythm: Normal rate and regular rhythm.     Heart sounds: Normal heart sounds, S1 normal and S2 normal.  Pulmonary:     Breath sounds: No decreased breath sounds, wheezing, rhonchi or rales.  Abdominal:     Palpations: Abdomen is soft.     Tenderness: There is no abdominal tenderness.  Musculoskeletal:     Right lower leg: No swelling.     Left lower leg: No swelling.  Skin:    General: Skin is warm.     Findings: No rash.  Neurological:     Mental Status: He is alert and oriented to person, place, and time.     Comments: Power 4+ out of 5 left arm and leg.  Incoordination with the left hand with rapid finger movements.      Condition at discharge: stable  The results of significant diagnostics from this hospitalization (including imaging, microbiology, ancillary and laboratory) are listed below for reference.   Imaging Studies: ECHOCARDIOGRAM LIMITED BUBBLE STUDY  Result Date: 11/24/2022    ECHOCARDIOGRAM LIMITED  REPORT   Patient Name:   Corey Chang Date of Exam: 11/24/2022 Medical Rec #:  [id number]         Height:       71.0 in Accession #:    [id number]        Weight:       184.5 lb Date of Birth:  April 05, 1981          BSA:          2.038 m Patient Age:    42 years          BP:           124/91 mmHg Patient Gender: M                 HR:           63 bpm. Exam Location:  [facility] Procedure:  Limited Echo, Color Doppler, Cardiac Doppler and Saline Contrast            Bubble Study Indications:     Stroke 434.91 / I63.9  History:         Patient has prior history of Echocardiogram examinations, most                  recent 04/03/2022. Risk Factors:Dyslipidemia. Seizures.  Sonographer:     Cristela Blue Referring Phys:  [id number] Verdene Lennert Diagnosing Phys: Debbe Odea MD IMPRESSIONS  1. Left ventricular ejection fraction, by estimation, is 60 to 65%. The left ventricle has normal function. The left ventricle has no regional wall motion abnormalities.  2. Right ventricular systolic function is normal. The right ventricular size is normal.  3. The mitral valve is normal in structure. Mild mitral valve regurgitation.  4. The aortic valve is tricuspid.  5. The inferior vena cava is normal in size with greater than 50% respiratory variability, suggesting right atrial pressure of 3 mmHg.  6. Agitated saline contrast bubble study was negative, with no evidence of any interatrial shunt. FINDINGS  Left Ventricle: Left ventricular ejection fraction, by estimation, is 60 to 65%. The left ventricle has normal function. The left ventricle has no regional wall motion abnormalities. The left ventricular internal cavity size was normal in size. There is  no left ventricular hypertrophy. Right Ventricle: The right ventricular size is normal. Right ventricular systolic function is normal. Left Atrium: Left atrial size was normal in size. Right Atrium: Right atrial size was normal in size. Mitral Valve: The mitral valve is normal in structure.  Mild mitral valve regurgitation. Tricuspid Valve: The tricuspid valve is normal in structure. Tricuspid valve regurgitation is mild. Aortic Valve: The aortic valve is tricuspid. Aorta: The aortic root is normal in size and structure. Venous: The inferior vena cava is normal in size with greater than 50% respiratory variability, suggesting right atrial pressure of 3 mmHg. IAS/Shunts: No atrial level shunt detected by color flow Doppler. Agitated saline contrast was given intravenously to evaluate for intracardiac shunting. Agitated saline contrast bubble study was negative, with no evidence of any interatrial shunt. LEFT VENTRICLE PLAX 2D LVIDd:         4.30 cm LVIDs:         3.00 cm LV PW:         0.70 cm LV IVS:        0.80 cm LVOT diam:     2.00 cm LVOT Area:     3.14 cm  RIGHT VENTRICLE RV Basal diam:  3.25 cm RV Mid diam:    3.00 cm RV S prime:     9.68 cm/s TAPSE (M-mode): 1.5 cm LEFT ATRIUM         Index       RIGHT ATRIUM           Index LA diam:    1.90 cm 0.93 cm/m  RA Area:     13.60 cm                                 RA Volume:   29.00 ml  14.23 ml/m   AORTA Ao Root diam: 2.70 cm TRICUSPID VALVE TR Peak grad:   6.8 mmHg TR Vmax:        130.00 cm/s  SHUNTS Systemic Diam: 2.00 cm Debbe Odea MD Electronically signed by Debbe Odea MD Signature Date/Time:  11/24/2022/2:27:25 PM    Final    CT ANGIO HEAD NECK W WO CM  Result Date: 11/23/2022 CLINICAL DATA:  Stroke suspected EXAM: CT ANGIOGRAPHY HEAD AND NECK WITH AND WITHOUT CONTRAST TECHNIQUE: Multidetector CT imaging of the head and neck was performed using the standard protocol during bolus administration of intravenous contrast. Multiplanar CT image reconstructions and MIPs were obtained to evaluate the vascular anatomy. Carotid stenosis measurements (when applicable) are obtained utilizing NASCET criteria, using the distal internal carotid diameter as the denominator. RADIATION DOSE REDUCTION: This exam was performed according to the  departmental dose-optimization program which includes automated exposure control, adjustment of the mA and/or kV according to patient size and/or use of iterative reconstruction technique. CONTRAST:  75mL OMNIPAQUE IOHEXOL 350 MG/ML SOLN COMPARISON:  Same day brain MRI FINDINGS: CT HEAD FINDINGS See same day CT brain for intracranial findings. No contrast-enhancing lesion is visualized. CTA NECK FINDINGS Aortic arch: Standard branching. Imaged portion shows no evidence of aneurysm or dissection. No significant stenosis of the major arch vessel origins. Right carotid system: No evidence of dissection, stenosis (50% or greater), or occlusion. Left carotid system: No evidence of dissection, stenosis (50% or greater), or occlusion. Vertebral arteries: Right-dominant. The left vertebral artery is diffuse is diffusely small in caliber and terminates in a PICA, unchanged from prior exam. No evidence of dissection, stenosis (50% or greater), or occlusion. Skeleton: Negative. Other neck: Negative. Upper chest: Negative. Review of the MIP images confirms the above findings CTA HEAD FINDINGS Anterior circulation: Moderate narrowing of the supraclinoid ICA on the left. Moderate to severe focal narrowing of the A2 segment of the right ACA (series 6, image 95). Posterior circulation: Unchanged moderate focal narrowing in the proximal basilar artery (series 6, image 126/series 7, image 114)). Venous sinuses: As permitted by contrast timing, patent. Anatomic variants: None Review of the MIP images confirms the above findings IMPRESSION: 1. Moderate narrowing of the supraclinoid left ICA and moderate to severe focal narrowing of the A2 segment of the right ACA 2. Moderate focal narrowing of the proximal basilar artery. 3. No hemodynamically significant stenosis in the neck. Left vertebral artery is small in caliber and terminates as a PICA. Electronically Signed   By: Lorenza Cambridge M.D.   On: 11/23/2022 19:03   MR Brain W and Wo  Contrast  Result Date: 11/23/2022 CLINICAL DATA:  Stroke suspected EXAM: MRI HEAD WITHOUT AND WITH CONTRAST TECHNIQUE: Multiplanar, multiecho pulse sequences of the brain and surrounding structures were obtained without and with intravenous contrast. CONTRAST:  10mL GADAVIST GADOBUTROL 1 MMOL/ML IV SOLN COMPARISON:  Same day CT head, MR Head 08/14/21 FINDINGS: Brain: There is an acute infarct in the medulla (series 7, image 52). Unchanged region of susceptibility artifact and FLAIR signal abnormality in the medial temporal lobe on the right, favored to represent an oligodendroglioma. No new sites of contrast enhancement in this region. No new sites of hemorrhage. No extra-axial fluid collection. Unchanged T2/FLAIR hyperintense signal abnormality in the right thalamus. Sequela of mild chronic microvascular ischemic change. 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 Vascular: Normal flow voids. Skull and upper cervical spine: Normal marrow signal. Sinuses/Orbits: No middle ear or mastoid effusion. Mucosal thickening right maxillary sinus. Bilateral orbits are unremarkable. Other: None. IMPRESSION: 1. 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 2. Unchanged nonenhancing right temporal lobe mass, previously reported to represent an oligodendroglioma. Electronically Signed   By: Lorenza Cambridge M.D.   On: 11/23/2022 17:21  CT HEAD WO CONTRAST  Result Date: 11/23/2022 CLINICAL DATA:  Neuro deficit, acute, stroke suspected. History of malignant left temporal lobe tumor treated with radiation. EXAM: CT HEAD WITHOUT CONTRAST TECHNIQUE: Contiguous axial images were obtained from the base of the skull through the vertex without intravenous contrast. RADIATION DOSE REDUCTION: This exam was performed according to the departmental dose-optimization program which includes automated exposure control, adjustment of the mA and/or kV according to patient size and/or use of iterative  reconstruction technique. COMPARISON:  Head CT 04/02/2022 FINDINGS: Brain: No abnormality seen affecting the brainstem or cerebellum. Chronic low density in the anteromedial right temporal lobe, better shown by MRI, consistent with treated tumor. No evidence progressive mass effect or edema. Few other small foci of low density in the hemispheric white matter are stable. No hemorrhage, hydrocephalus or extra-axial collection. Vascular: There is atherosclerotic calcification of the major vessels at the base of the brain. Skull: Negative Sinuses/Orbits: Clear/normal Other: Previously seen left mastoid effusion has resolved. IMPRESSION: No acute CT finding. Chronic low density in the anteromedial right temporal lobe, better shown by MRI, consistent with treated tumor. No evidence of progressive mass effect or edema. No other acute intracranial finding. Electronically Signed   By: Paulina Fusi M.D.   On: 11/23/2022 14:19    Microbiology: Results for orders placed or performed during the hospital encounter of 04/02/22  Resp Panel by RT-PCR (Flu A&B, Covid) Anterior Nasal Swab     Status: None   Collection Time: 04/02/22 10:16 PM   Specimen: Anterior Nasal Swab  Result Value Ref Range Status   SARS Coronavirus 2 by RT PCR NEGATIVE NEGATIVE Final    Comment: (NOTE) SARS-CoV-2 target nucleic acids are NOT DETECTED.  The SARS-CoV-2 RNA is generally detectable in upper respiratory specimens during the acute phase of infection. The lowest concentration of SARS-CoV-2 viral copies this assay can detect is 138 copies/mL. A negative result does not preclude SARS-Cov-2 infection and should not be used as the sole basis for treatment or other patient management decisions. A negative result may occur with  improper specimen collection/handling, submission of specimen other than nasopharyngeal swab, presence of viral mutation(s) within the areas targeted by this assay, and inadequate number of viral copies(<138  copies/mL). A negative result must be combined with clinical observations, patient history, and epidemiological information. The expected result is Negative.  Fact Sheet for Patients:  BloggerCourse.com  Fact Sheet for Healthcare Providers:  SeriousBroker.it  This test is no t yet approved or cleared by the [place] FDA and  has been authorized for detection and/or diagnosis of SARS-CoV-2 by FDA under an Emergency Use Authorization (EUA). This EUA will remain  in effect (meaning this test can be used) for the duration of the COVID-19 declaration under Section 564(b)(1) of the Act, 21 U.S.C.section 360bbb-3(b)(1), unless the authorization is terminated  or revoked sooner.       Influenza A by PCR NEGATIVE NEGATIVE Final   Influenza B by PCR NEGATIVE NEGATIVE Final    Comment: (NOTE) The Xpert Xpress SARS-CoV-2/FLU/RSV plus assay is intended as an aid in the diagnosis of influenza from Nasopharyngeal swab specimens and should not be used as a sole basis for treatment. Nasal washings and aspirates are unacceptable for Xpert Xpress SARS-CoV-2/FLU/RSV testing.  Fact Sheet for Patients: BloggerCourse.com  Fact Sheet for Healthcare Providers: SeriousBroker.it  This test is not yet approved or cleared by the [place] FDA and has been authorized for detection and/or diagnosis of SARS-CoV-2 by FDA under  an Emergency Use Authorization (EUA). This EUA will remain in effect (meaning this test can be used) for the duration of the COVID-19 declaration under Section 564(b)(1) of the Act, 21 U.S.C. section 360bbb-3(b)(1), unless the authorization is terminated or revoked.  Performed at [facility], [address], [place], [place] [address]     Labs: CBC: Recent Labs  Lab 11/23/22 1252 11/24/22 0607 11/26/22 0433  WBC 5.9 4.5  --   NEUTROABS 3.2 2.0  --   HGB  17.9* 16.7 18.3*  HCT 52.9* 49.1  --   MCV 88.6 88.8  --   PLT 267 238  --    Basic Metabolic Panel: Recent Labs  Lab 11/23/22 1252 11/24/22 0607 11/26/22 0433  NA 133* 134* 133*  K 3.7 3.6 3.5  CL 100 105 103  CO2 21* 23 22  GLUCOSE 107* 96 97  BUN 11 10 14   CREATININE 0.92 0.84 0.94  CALCIUM 9.8 9.4 9.7   Liver Function Tests: Recent Labs  Lab 11/23/22 1252  AST 30  ALT 34  ALKPHOS 77  BILITOT 1.0  PROT 7.7  ALBUMIN 4.7   CBG: Recent Labs  Lab 11/23/22 1743  GLUCAP 98    Discharge time spent: greater than 30 minutes. Filled out paperwork for work  Signed: Alford Highland, MD [facility] 11/26/2022

## 2022-11-26 NOTE — Progress Notes (Signed)
Inpatient Rehabilitation Admissions Coordinator   Patient has been approved and to admit to [facility] CIR rehab program today. Care link has been arranged and are to pick up at 1130. He will admit to room 4 west 26. Acute team and TOC made aware on 5/9 and 5/10. I have made the arrangements to admit.  Ottie Glazier, RN, MSN Rehab Admissions Coordinator [phone] 11/26/2022 8:07 AM
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
## 2022-11-26 NOTE — TOC Transition Note (Signed)
Transition of Care [facility]) - CM/SW Discharge Note   Patient Details  Name: Corey Chang MRN: [id number] Date of Birth: 12-09-1980  [facility] CM/SW Contact:  Allena Katz, LCSW Phone Number: 11/26/2022, 9:30 AM   Clinical Narrative  Pt has orders to discharge to [facility] rehab. Medical Necessity printed to unit. [facility] to transport. CIR to arrange. CSW signing off.    Final next level of care: IP Rehab Facility Barriers to Discharge: Barriers Resolved   Patient Goals and CMS Choice   Choice offered to / list presented to : Patient  Discharge Placement                  Patient to be transferred to facility by: [facility] health inpatient rehab      Discharge Plan and Services Additional resources added to the After Visit Summary for                                       Social Determinants of Health (SDOH) Interventions SDOH Screenings   Food Insecurity: No Food Insecurity (11/23/2022)  Housing: Low Risk  (11/23/2022)  Transportation Needs: No Transportation Needs (11/23/2022)  Utilities: Not At Risk (11/23/2022)  Tobacco Use: High Risk (11/25/2022)     Readmission Risk Interventions     No data to display

## 2022-11-26 NOTE — Progress Notes (Signed)
Patient being discharged to Inpatient rehab at [facility]. [facility] given to [facility]. All belongings sent with patient and wife. Peripheral IVs removed.

## 2022-11-26 NOTE — Plan of Care (Signed)
  Problem: Education: Goal: Knowledge of disease or condition will improve Outcome: Progressing Goal: Knowledge of secondary prevention will improve (MUST DOCUMENT ALL) Outcome: Progressing Goal: Knowledge of patient specific risk factors will improve Corey Chang N/A or DELETE if not current risk factor) Outcome: Progressing   Problem: Ischemic Stroke/TIA Tissue Perfusion: Goal: Complications of ischemic stroke/TIA will be minimized Outcome: Progressing   Problem: Coping: Goal: Will identify appropriate support needs Outcome: Progressing   Problem: Health Behavior/Discharge Planning: Goal: Ability to manage health-related needs will improve Outcome: Progressing Goal: Goals will be collaboratively established with patient/family Outcome: Progressing   Problem: Self-Care: Goal: Ability to participate in self-care as condition permits will improve Outcome: Progressing Goal: Ability to communicate needs accurately will improve Outcome: Progressing

## 2022-11-26 NOTE — Progress Notes (Signed)
Angelina Sheriff, DO  Physician Physical Medicine and Rehabilitation   PMR Pre-admission    Signed   Date of Service: 11/25/2022  7:00 PM  Related encounter: ED to Hosp-Admission (Discharged) from 11/23/2022 in [facility] 1C MEDICAL TELEMETRY   Signed      Show:Clear all [x] Written[x] Templated[] Copied  Added by: [x] Kelseigh Diver, Tye [place], RN[x] Angelina Sheriff, DO  [] Hover for details PMR Admission Coordinator Pre-Admission Assessment   Patient: Corey Chang is an 42 y.o., male MRN: [id number] DOB: Jul 02, 1981 Height: 5\' 11"  (180.3 cm) Weight: 83.7 kg   Insurance Information HMO:     PPO: yes     PCP:      IPA:      80/20:      OTHER:  PRIMARY: [facility] BCBS      Policy#: [id number]      Subscriber: pt CM Name: Deanna      Phone#: [phone]     Fax#: [phone] Pre-Cert#: [id number] approved 5/10 until 12/09/22      Employer: [employer] Benefits:  Phone #: [phone]     Name: 5/9 Eff. Date: 07/19/22     Deduct: $1250      Out of Pocket Max: $4890      Life Max: none CIR: 80%      SNF: 80% 100 days Outpatient: $10 to $52 per visit     Co-Pay: visits per medical neccesity Home Health: 80%      Co-Pay: 20% DME: 80%     Co-Pay: 20% Providers: in network  SECONDARY: none   Financial Counselor:       Phone#:    The Data processing manager" for patients in Inpatient Rehabilitation Facilities with attached "Privacy Act Statement-Health Care Records" was provided and verbally reviewed with: N/A   Emergency Contact Information Contact Information       Name Relation Home Work Mobile    Montrose,[name] Spouse [phone]             Current Medical History  Patient Admitting Diagnosis: CVA   History of Present Illness: 42 year old male with history of oligodendroglioma s/p radiation, seizure disorder, HLD who present to [facility] on 11/23/22 due to left sided numbness. He had several episodes of dizziness with nausea and vomiting.  Developed into having difficulty walking.    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 Neurology consulted. Started on ASA and Plavix for 3 weeks and Lipitor. Neurology workup sent off hypercoagulable workup. LDL 192. Echo showed normal EF and Bubble study negative. Hgb 16.7. Sent off JAK2 mutation analysis and erythropoietin level.  Continue home Keppra and lamotrigine.   Complete NIHSS TOTAL: 4   Patient's medical record from [facility] has been reviewed by the rehabilitation admission coordinator and physician.   Past Medical History      Past Medical History:  Diagnosis Date   Allergic rhinitis 04/22/1997   Ankle fracture, right 07/2007   Cholelithiasis     Oligodendroglioma (HCC) 2001    Left temporal; bx in 12/1999.  He had external beam radiation in August 2001 with 54 Gy in 30 fractions on RTOG 9802 protocol.   He declined chemotherapy.    Seizure (HCC)      from history of oligodendroglioma    Has the patient had major surgery during 100 days prior to admission? No   Family History   family history includes Alzheimer's disease in his mother and paternal grandmother; Diabetes in his father; Heart disease in his  maternal grandfather; Parkinsonism in his paternal grandmother.   Current Medications   Current Facility-Administered Medications:    acetaminophen (TYLENOL) tablet 650 mg, 650 mg, Oral, Q4H PRN **OR** acetaminophen (TYLENOL) 160 MG/5ML solution 650 mg, 650 mg, Per Tube, Q4H PRN **OR** acetaminophen (TYLENOL) suppository 650 mg, 650 mg, Rectal, Q4H PRN, Verdene Lennert, MD   aspirin EC tablet 81 mg, 81 mg, Oral, Daily, Verdene Lennert, MD, 81 mg at 11/26/22 0902   atorvastatin (LIPITOR) tablet 80 mg, 80 mg, Oral, Daily, Verdene Lennert, MD, 80 mg at 11/25/22 2053   clopidogrel (PLAVIX) tablet 75 mg, 75 mg, Oral, Daily, Verdene Lennert, MD, 75 mg at 11/26/22 0902   enoxaparin (LOVENOX) injection 40 mg, 40 mg, Subcutaneous, Q24H, Huel Cote, Iulia, MD, 40 mg at 11/25/22 2053    lamoTRIgine (LAMICTAL XR) 24 hour tablet 200 mg, 200 mg, Oral, QPM, Verdene Lennert, MD, 200 mg at 11/25/22 1703   levETIRAcetam (KEPPRA) tablet 1,500 mg, 1,500 mg, Oral, BID, Verdene Lennert, MD, 1,500 mg at 11/26/22 0902   senna-docusate (Senokot-S) tablet 1 tablet, 1 tablet, Oral, QHS PRN, Verdene Lennert, MD   Patients Current Diet:  Diet Order                  Diet regular Fluid consistency: Thin  Diet effective now                       Precautions / Restrictions Precautions Precautions: Fall Restrictions Weight Bearing Restrictions: No    Has the patient had 2 or more falls or a fall with injury in the past year? 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 Independent   Indoor Mobility: Did the patient need assistance with walking from room to room (with or without device)? Independent   Stairs: Did the patient need assistance with internal or external stairs (with or without device)? Independent   Functional Cognition: Did the patient need help planning regular tasks such as shopping or remembering to take medications? Independent   Patient Information Are you of Hispanic, Latino/a,or Spanish origin?: A. No, not of Hispanic, Latino/a, or Spanish origin What is your race?: A. White Do you need or want an interpreter to communicate with a doctor or health care staff?: 0. No   Patient's Response To:  Health Literacy and Transportation Is the patient able to respond to health literacy and transportation needs?: Yes Health Literacy - How often do you need to have someone help you when you read instructions, pamphlets, or other written material from your doctor or pharmacy?: Never In the past 12 months, has lack of transportation kept you from medical appointments or from getting medications?: No In the  past 12 months, has lack of transportation kept you from meetings, work, or from getting things needed for daily living?: No   Journalist, newspaper / Equipment Home Assistive Devices/Equipment: None Home Equipment: Crutches   Prior Device Use: Indicate devices/aids used by the patient prior to current illness, exacerbation or injury? None of the above   Current Functional Level Cognition   Overall Cognitive Status: Within Functional Limits for tasks assessed Orientation Level: Oriented X4 General Comments: completed line bisection test with no deficits noted Attention:  [facility]) Memory: Appears intact Awareness: Appears intact Problem Solving: Appears intact Executive Function: Reasoning, Sequencing, Organizing, Self Monitoring, Self Correcting (  clockdrawing task) Reasoning: Appears intact Sequencing: Appears intact Organizing: Appears intact Self Monitoring: Appears intact Self Correcting: Appears intact Safety/Judgment: Appears intact (verbal)    Extremity Assessment (includes Sensation/Coordination)   Upper Extremity Assessment: LUE deficits/detail LUE Deficits / Details: Grossly 3+/5, reports numbness/tingling t/o LUE. RAM and FNF impaired, achieves FNF with increased time  Lower Extremity Assessment: Defer to PT evaluation LLE Deficits / Details: L grip weaker than right. L LE weaker in hip flexors but grossly LLE Sensation: decreased light touch LLE Coordination: decreased gross motor     ADLs   Overall ADL's : Needs assistance/impaired General ADL Comments: SUPERVISION don B shoes seated EOB.     Mobility   Overal bed mobility: Modified Independent General bed mobility comments: Semi-supine to/from sitting (pt using R UE to move L LE)     Transfers   Overall transfer level: Needs assistance Equipment used: None Transfers: Sit to/from Stand Sit to Stand: Min assist General transfer comment: x3 trials standing from bed (assist to steady); vc's for UE placement      Ambulation / Gait / Stairs / Wheelchair Mobility   Ambulation/Gait Ambulation/Gait assistance: Editor, commissioning (Feet): 30 Feet Assistive device: 1 person hand held assist Gait Pattern/deviations: Step-through pattern, Drifts right/left General Gait Details: decreased coordination L LE with advancement; decreased L LE heelstrike; L knee hyperextension noted during R LE advancement (pt unable to correct consistently so deferred further ambulation distance for safety) Gait velocity: decreased     Posture / Balance Dynamic Sitting Balance Sitting balance - Comments: steady sitting reaching within BOS Static Standing Balance Tandem Stance - Right Leg:  (30 seconds with min assist for balance) Tandem Stance - Left Leg:  (30 seconds with min assist for balance) Rhomberg - Eyes Opened:  (30 seconds with CGA for safety) Rhomberg - Eyes Closed:  (30 seconds with min assist for balance (pt tending to lean towards L side)) Balance Overall balance assessment: Needs assistance Sitting-balance support: No upper extremity supported, Feet supported Sitting balance-Leahy Scale: Good Sitting balance - Comments: steady sitting reaching within BOS Standing balance support: Single extremity supported, During functional activity Standing balance-Leahy Scale: Poor Standing balance comment: assist for balance during ambulation Tandem Stance - Right Leg:  (30 seconds with min assist for balance) Tandem Stance - Left Leg:  (30 seconds with min assist for balance) Rhomberg - Eyes Opened:  (30 seconds with CGA for safety) Rhomberg - Eyes Closed:  (30 seconds with min assist for balance (pt tending to lean towards L side))     Special needs/care consideration      Previous Home Environment  Living Arrangements: Children (and 2 children 9 and 12)  Lives With: Spouse (2 childrens) Available Help at Discharge: Family, Available 24 hours/day Type of Home: House Home Layout: Two level, Able to live on  main level with bedroom/bathroom Alternate Level Stairs-Rails: Right Home Access: Stairs to enter Entrance Stairs-Rails: None Entrance Stairs-Number of Steps: 2 Bathroom Shower/Tub: Health visitor: Standard Bathroom Accessibility: Yes How Accessible: Accessible via walker Home Care Services: No Additional Comments: pt lives with wife (who works from home) and 2 children. 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   Discharge Living Setting Plans for Discharge Living Setting: Patient's home, Lives with (comment) (wife and 2 children) Type of Home at Discharge: House Discharge Home Layout: Two level, Able to live on main level with bedroom/bathroom Alternate Level Stairs-Rails: Right Alternate Level Stairs-Number of Steps: flight Discharge Home Access: Stairs to enter  Entrance Stairs-Rails: None Entrance Stairs-Number of Steps: 2 Discharge Bathroom Shower/Tub: Garment/textile technologist: Standard Discharge Bathroom Accessibility: Yes How Accessible: Accessible via walker Does the patient have any problems obtaining your medications?: No   Social/Family/Support Systems Patient Roles: Spouse, Parent (employee) Contact Information: wife, Magazine features editor Anticipated Caregiver: wife Anticipated Industrial/product designer Information: see contacts Ability/Limitations of Caregiver: none Caregiver Availability: 24/7 Discharge Plan Discussed with Primary Caregiver: Yes Is Caregiver In Agreement with Plan?: Yes Does Caregiver/Family have Issues with Lodging/Transportation while Pt is in Rehab?: No   Goals Patient/Family Goal for Rehab: Mod I to supervision with PT, OT  and SLP Expected length of stay: ELOS 7 days Pt/Family Agrees to Admission and willing to participate: Yes Program Orientation Provided & Reviewed with Pt/Caregiver Including Roles  & Responsibilities: Yes   Decrease burden of Care through IP rehab admission: n/a   Possible need for SNF placement upon discharge:  not anticipated   Patient Condition: I have reviewed medical records from [facility], spoken with  patient and spouse. I met with patient at the bedside for inpatient rehabilitation assessment.  Patient will benefit from ongoing PT, OT, and SLP, can actively participate in 3 hours of therapy a day 5 days of the week, and can make measurable gains during the admission.  Patient will also benefit from the coordinated team approach during an Inpatient Acute Rehabilitation admission.  The patient will receive intensive therapy as well as Rehabilitation physician, nursing, social worker, and care management interventions.  Due to bladder management, bowel management, safety, skin/wound care, disease management, medication administration, pain management, and patient education the patient requires 24 hour a day rehabilitation nursing.  The patient is currently min assist overall with mobility and basic ADLs.  Discharge setting and therapy post discharge at home with outpatient is anticipated.  Patient has agreed to participate in the Acute Inpatient Rehabilitation Program and will admit today.   Preadmission Screen Completed By:  Clois Dupes, RN MSN 11/26/2022 10:17 AM ______________________________________________________________________   Discussed status with Dr. Shearon Stalls on 11/26/22 at 1018 and received approval for admission today.   Admission Coordinator:  Clois Dupes, RN MSN time 1018 Date 11/26/22    Assessment/Plan: Diagnosis: Does the need for close, 24 hr/day Medical supervision in concert with the patient's rehab needs make it unreasonable for this patient to be served in a less intensive setting? Yes Co-Morbidities requiring supervision/potential complications: Hyponatremia, polycythemia, Hx oligodendroma s/p radiation therapy, seizure disorder Due to safety, disease management, medication administration, and patient education, does the patient require 24 hr/day rehab nursing?  Yes Does the patient require coordinated care of a physician, rehab nurse, PT, OT, and SLP to address physical and functional deficits in the context of the above medical diagnosis(es)? Yes Addressing deficits in the following areas: balance, endurance, locomotion, strength, transferring, bathing, dressing, feeding, grooming, toileting, cognition, and speech Can the patient actively participate in an intensive therapy program of at least 3 hrs of therapy 5 days a week? Yes The potential for patient to make measurable gains while on inpatient rehab is excellent Anticipated functional outcomes upon discharge from inpatient rehab: supervision PT, supervision OT, modified independent SLP Estimated rehab length of stay to reach the above functional goals is: 7-10 days Anticipated discharge destination: Home 10. Overall Rehab/Functional Prognosis: excellent     MD Signature:   Angelina Sheriff, DO 11/26/2022          Revision History

## 2022-11-26 NOTE — Progress Notes (Signed)
Patient ID: Corey Chang, male   DOB: [birth year], 42 y.o.   MRN: [id number]  INPATIENT REHABILITATION ADMISSION NOTE   Arrival Method: [facility]     Mental Orientation: x4   Assessment: completed   Skin: CDI   IV'S: n/a   Pain: tingling to left side reported   Tubes and Drains: n/a   Safety Measures: in place   Vital Signs: see flowsheet   Height and Weight: see flowsheet   Rehab Orientation: completed   Family: notified

## 2022-11-27 DIAGNOSIS — E871 Hypo-osmolality and hyponatremia: Secondary | ICD-10-CM | POA: Diagnosis not present

## 2022-11-27 DIAGNOSIS — R197 Diarrhea, unspecified: Secondary | ICD-10-CM

## 2022-11-27 DIAGNOSIS — I639 Cerebral infarction, unspecified: Secondary | ICD-10-CM | POA: Diagnosis not present

## 2022-11-27 LAB — CBC WITH DIFFERENTIAL/PLATELET
Abs Immature Granulocytes: 0.02 10*3/uL (ref 0.00–0.07)
Basophils Absolute: 0.1 10*3/uL (ref 0.0–0.1)
Basophils Relative: 1 %
Eosinophils Absolute: 0 10*3/uL (ref 0.0–0.5)
Eosinophils Relative: 0 %
HCT: 51.7 % (ref 39.0–52.0)
Hemoglobin: 18 g/dL — ABNORMAL HIGH (ref 13.0–17.0)
Immature Granulocytes: 0 %
Lymphocytes Relative: 16 %
Lymphs Abs: 1.1 10*3/uL (ref 0.7–4.0)
MCH: 30.1 pg (ref 26.0–34.0)
MCHC: 34.8 g/dL (ref 30.0–36.0)
MCV: 86.5 fL (ref 80.0–100.0)
Monocytes Absolute: 0.5 10*3/uL (ref 0.1–1.0)
Monocytes Relative: 8 %
Neutro Abs: 4.9 10*3/uL (ref 1.7–7.7)
Neutrophils Relative %: 75 %
Platelets: 218 10*3/uL (ref 150–400)
RBC: 5.98 MIL/uL — ABNORMAL HIGH (ref 4.22–5.81)
RDW: 13.2 % (ref 11.5–15.5)
WBC: 6.6 10*3/uL (ref 4.0–10.5)
nRBC: 0 % (ref 0.0–0.2)

## 2022-11-27 LAB — COMPREHENSIVE METABOLIC PANEL
ALT: 32 U/L (ref 0–44)
AST: 27 U/L (ref 15–41)
Albumin: 4.2 g/dL (ref 3.5–5.0)
Alkaline Phosphatase: 76 U/L (ref 38–126)
Anion gap: 11 (ref 5–15)
BUN: 14 mg/dL (ref 6–20)
CO2: 18 mmol/L — ABNORMAL LOW (ref 22–32)
Calcium: 9.6 mg/dL (ref 8.9–10.3)
Chloride: 101 mmol/L (ref 98–111)
Creatinine, Ser: 1.08 mg/dL (ref 0.61–1.24)
GFR, Estimated: >60 mL/min (ref >60)
Glucose, Bld: 97 mg/dL (ref 70–99)
Potassium: 3.7 mmol/L (ref 3.5–5.1)
Sodium: 130 mmol/L — ABNORMAL LOW (ref 135–145)
Total Bilirubin: 1 mg/dL (ref 0.3–1.2)
Total Protein: 7.6 g/dL (ref 6.5–8.1)

## 2022-11-27 LAB — LUPUS ANTICOAGULANT PANEL
DRVVT: 23.5 s (ref 0.0–47.0)
PTT Lupus Anticoagulant: 40.1 s (ref 0.0–43.5)

## 2022-11-27 LAB — PROTEIN S, TOTAL: Protein S Ag, Total: 122 % (ref 60–150)

## 2022-11-27 LAB — PROTEIN C ACTIVITY: Protein C Activity: 121 % (ref 73–180)

## 2022-11-27 LAB — PROTEIN S ACTIVITY: Protein S Activity: 117 % (ref 63–140)

## 2022-11-27 MED ORDER — BISMUTH SUBSALICYLATE 262 MG/15ML PO SUSP: 30.0000 mL | ORAL | Status: DC | PRN

## 2022-11-27 MED ORDER — CALCIUM POLYCARBOPHIL 625 MG PO TABS
625.0000 mg | ORAL_TABLET | Freq: Every day | ORAL | Status: DC
  Administered 2022-11-27 – 2022-12-03 (×7): 625 mg via ORAL
  Filled 2022-11-27 (×7): qty 1

## 2022-11-27 MED ORDER — SIMETHICONE 80 MG PO CHEW: 80.0000 mg | CHEWABLE_TABLET | Freq: Four times a day (QID) | ORAL | Status: DC | PRN

## 2022-11-27 MED ORDER — LOPERAMIDE HCL 2 MG PO CAPS: 2.0000 mg | ORAL_CAPSULE | ORAL | Status: DC | PRN

## 2022-11-27 NOTE — Plan of Care (Signed)
  Problem: RH Balance Goal: LTG Patient will maintain dynamic standing with ADLs (OT) Description: LTG:  Patient will maintain dynamic standing balance with assist during activities of daily living (OT)  Flowsheets (Taken 11/27/2022 1645) LTG: Pt will maintain dynamic standing balance during ADLs with: Contact Guard/Touching assist   Problem: Sit to Stand Goal: LTG:  Patient will perform sit to stand in prep for activites of daily living with assistance level (OT) Description: LTG:  Patient will perform sit to stand in prep for activites of daily living with assistance level (OT) Flowsheets (Taken 11/27/2022 1645) LTG: PT will perform sit to stand in prep for activites of daily living with assistance level: Supervision/Verbal cueing   Problem: RH Grooming Goal: LTG Patient will perform grooming w/assist,cues/equip (OT) Description: LTG: Patient will perform grooming with assist, with/without cues using equipment (OT) Flowsheets (Taken 11/27/2022 1645) LTG: Pt will perform grooming with assistance level of: Supervision/Verbal cueing   Problem: RH Bathing Goal: LTG Patient will bathe all body parts with assist levels (OT) Description: LTG: Patient will bathe all body parts with assist levels (OT) Flowsheets (Taken 11/27/2022 1645) LTG: Pt will perform bathing with assistance level/cueing: Supervision/Verbal cueing LTG: Position pt will perform bathing: Shower   Problem: RH Dressing Goal: LTG Patient will perform upper body dressing (OT) Description: LTG Patient will perform upper body dressing with assist, with/without cues (OT). Flowsheets (Taken 11/27/2022 1645) LTG: Pt will perform upper body dressing with assistance level of: Supervision/Verbal cueing Goal: LTG Patient will perform lower body dressing w/assist (OT) Description: LTG: Patient will perform lower body dressing with assist, with/without cues in positioning using equipment (OT) Flowsheets (Taken 11/27/2022 1645) LTG: Pt will  perform lower body dressing with assistance level of: Supervision/Verbal cueing   Problem: RH Toileting Goal: LTG Patient will perform toileting task (3/3 steps) with assistance level (OT) Description: LTG: Patient will perform toileting task (3/3 steps) with assistance level (OT)  Flowsheets (Taken 11/27/2022 1645) LTG: Pt will perform toileting task (3/3 steps) with assistance level: Supervision/Verbal cueing   Problem: RH Functional Use of Upper Extremity Goal: LTG Patient will use RT/LT upper extremity as a (OT) Description: LTG: Patient will use right/left upper extremity as a stabilizer/gross assist/diminished/nondominant/dominant level with assist, with/without cues during functional activity (OT) Flowsheets (Taken 11/27/2022 1645) LTG: Use of upper extremity in functional activities: LUE as nondominant level LTG: Pt will use upper extremity in functional activity with assistance level of: Contact Guard/Touching assist   Problem: RH Simple Meal Prep Goal: LTG Patient will perform simple meal prep w/assist (OT) Description: LTG: Patient will perform simple meal prep with assistance, with/without cues (OT). Flowsheets (Taken 11/27/2022 1645) LTG: Pt will perform simple meal prep with assistance level of: Minimal Assistance - Patient > 75% LTG: Pt will perform simple meal prep w/level of: Ambulate with device   Problem: RH Toilet Transfers Goal: LTG Patient will perform toilet transfers w/assist (OT) Description: LTG: Patient will perform toilet transfers with assist, with/without cues using equipment (OT) Flowsheets (Taken 11/27/2022 1645) LTG: Pt will perform toilet transfers with assistance level of: Contact Guard/Touching assist   Problem: RH Tub/Shower Transfers Goal: LTG Patient will perform tub/shower transfers w/assist (OT) Description: LTG: Patient will perform tub/shower transfers with assist, with/without cues using equipment (OT) Flowsheets (Taken 11/27/2022 1645) LTG: Pt  will perform tub/shower stall transfers with assistance level of: Contact Guard/Touching assist LTG: Pt will perform tub/shower transfers from: Walk in shower

## 2022-11-27 NOTE — Progress Notes (Signed)
PROGRESS NOTE   Subjective/Complaints:  Pt doing alright today, slept well overnight, denies pain. Reports having too many liquidy stools since yesterday, was previously constipated and thinks he received miralax prior to getting here but isn't really sure. No suspicious food intake and no other source of diarrhea per his knowledge. Denies any other associated symptoms with the diarrhea. Would like pepto bismol and gas X because that usually helps him.  Urinating fine. Denies any other complaints or concerns.   ROS: +diarrhea. Denies fevers, chills, CP, SOB, abd pain, N/V/C, new/worsening paresthesias/weakness, or any other complaints at this time.    Objective:   No results found. Recent Labs    11/26/22 0433 11/27/22 0520  WBC  --  6.6  HGB 18.3* 18.0*  HCT  --  51.7  PLT  --  218   Recent Labs    11/26/22 0433 11/27/22 0520  NA 133* 130*  K 3.5 3.7  CL 103 101  CO2 22 18*  GLUCOSE 97 97  BUN 14 14  CREATININE 0.94 1.08  CALCIUM 9.7 9.6    Intake/Output Summary (Last 24 hours) at 11/27/2022 1404 Last data filed at 11/27/2022 1300 Gross per 24 hour  Intake 597 ml  Output --  Net 597 ml        Physical Exam: Vital Signs Blood pressure 128/87, pulse (!) 104, temperature 98.8 F (37.1 C), temperature source Oral, resp. rate 18, height 5\' 11"  (1.803 m), weight 86.4 kg, SpO2 93 %.  Constitutional: No apparent distress. Appropriate appearance for age. Sitting in bed. HENT: No JVD. Neck Supple. Trachea midline. Atraumatic, normocephalic. + R HOH Eyes: PERRLA. EOMI. Visual fields grossly intact.   Cardiovascular: RRR, no murmurs/rub/gallops. No Edema. Peripheral pulses 2+  Respiratory: CTAB. No rales, rhonchi, or wheezing. On RA.  Abdomen: + bowel sounds, normoactive. No distention or tenderness.  Skin: C/D/I. No apparent lesions. Neuro: very mild dysarthria MsK: mild weakness in LUE but able to lift off bed  and point to opposite side  PRIOR EXAMS: MSK:      No apparent deformity.      Strength:                RUE: 5/5 SA, 5/5 EF, 5/5 EE, 5/5 WE, 5/5 FF, 5/5 FA                 LUE: 4-/5 throughout                RLE: 5/5 HF, 5/5 KE, 5/5 DF, 5/5 EHL, 5/5 PF                 LLE:  5-/5 throughout   Neurologic exam: Horizontal nystagmus Left>right.  Cognition: AAO to person, place, time and event.  Language: Fluent, No substitutions or neoglisms.  mild dysarthria. Names 3/3 objects correctly.  Memory: Recalls 3/3 objects at 5 minutes. No apparent deficits  Insight: Good  insight into current condition.  Mood: Pleasant affect, appropriate mood.  Sensation: Left hemibody sensory deficits  Reflexes: 2+ in BL UE and LEs. Negative Hoffman's and babinski signs bilaterally.  CN:   Left facial droop, mild L V2-3 sensory loss Decreased L  shoulder shrug   Coordination:  Dysmetria with left FTN and decrease in motor control LLE.  Spasticity: MAS 0 in all extremities.  Assessment/Plan: 1. Functional deficits which require 3+ hours per day of interdisciplinary therapy in a comprehensive inpatient rehab setting. Physiatrist is providing close team supervision and 24 hour management of active medical problems listed below. Physiatrist and rehab team continue to assess barriers to discharge/monitor patient progress toward functional and medical goals  Care Tool:  Bathing              Bathing assist       Upper Body Dressing/Undressing Upper body dressing        Upper body assist      Lower Body Dressing/Undressing Lower body dressing            Lower body assist       Toileting Toileting    Toileting assist Assist for toileting: Minimal Assistance - Patient > 75% (RW)     Transfers Chair/bed transfer  Transfers assist           Locomotion Ambulation   Ambulation assist              Walk 10 feet activity   Assist           Walk 50 feet  activity   Assist           Walk 150 feet activity   Assist           Walk 10 feet on uneven surface  activity   Assist           Wheelchair     Assist               Wheelchair 50 feet with 2 turns activity    Assist            Wheelchair 150 feet activity     Assist          Blood pressure 128/87, pulse (!) 104, temperature 98.8 F (37.1 C), temperature source Oral, resp. rate 18, height 5\' 11"  (1.803 m), weight 86.4 kg, SpO2 93 %.  Medical Problem List and Plan: 1. 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  Antithrombotics: -DVT/anticoagulation:  Pharmaceutical: Lovenox 40mg  QD             -antiplatelet therapy: ASA 81 mg, Plavix 75 mg (x21 days) 3. Pain Management: Tylenol prn.  4. Mood/Behavior/Sleep: LCSW to follow for evaluation and support.  --insomnia due to noises in the hospital/bored and sleeping during the day. Trazodone prn ordered.              -antipsychotic agents: N/A 5. Neuropsych/cognition: This patient is capable of making decisions on his own behalf. 6. Skin/Wound Care: Routine pressure relief measures.  7. Fluids/Electrolytes/Nutrition: Monitor I/O. Monitor routine labs 8. Hyponatremia: Question baseline.  -11/27/22 Na 130 this morning, down slightly from 133, monitor for now and recheck BMP in AM 9. Hyperlipidemia: LDL-192, Trig-254, HDL-35             --now on high dose statin, Atorvastatin 80mg  QD.  10. H/o Seizures: Started 1998 and last 02/2022 per records. Followed by Dr. Karel Jarvis.             --  on Lamictal  XR 200 mg/pm and Keppra 1500 mg BID 11. Constipation: Has resolved and does not want any laxative scheduled. PRNs available 12. Seasonal allergies: continue claritin 10mg  QD 13. Polycythemia:  -11/27/22 elevated homocysteine 24.7, Hgb stable 18 range, defer to primary  team, pending hypercoag panel 14. Diarrhea, started 11/26/22 -11/27/22 several liquidy stools overnight, imodium 2mg  given x2 and not much help; no obvious source but may have received miralax prior to arrival; no other s/sx, doubt acute infection or need for emergent work up for now; requesting pepto and gas X; ordered Pepto bismol 30ml q4h PRN, simethicone 80mg  q6h PRN, and fibercon 625mg  QD.     LOS: 1 days A FACE TO FACE EVALUATION WAS PERFORMED  [address] 11/27/2022, 2:04 PM
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
## 2022-11-27 NOTE — Plan of Care (Signed)
  Problem: RH Balance Goal: LTG Patient will maintain dynamic sitting balance (PT) Description: LTG:  Patient will maintain dynamic sitting balance with assistance during mobility activities (PT) Flowsheets (Taken 11/27/2022 1932) LTG: Pt will maintain dynamic sitting balance during mobility activities with:: Independent with assistive device  Goal: LTG Patient will maintain dynamic standing balance (PT) Description: LTG:  Patient will maintain dynamic standing balance with assistance during mobility activities (PT) Flowsheets (Taken 11/27/2022 1932) LTG: Pt will maintain dynamic standing balance during mobility activities with:: Supervision/Verbal cueing   Problem: Sit to Stand Goal: LTG:  Patient will perform sit to stand with assistance level (PT) Description: LTG:  Patient will perform sit to stand with assistance level (PT) Flowsheets (Taken 11/27/2022 1932) LTG: PT will perform sit to stand in preparation for functional mobility with assistance level: Independent with assistive device   Problem: RH Bed to Chair Transfers Goal: LTG Patient will perform bed/chair transfers w/assist (PT) Description: LTG: Patient will perform bed to chair transfers with assistance (PT). Flowsheets (Taken 11/27/2022 1932) LTG: Pt will perform Bed to Chair Transfers with assistance level: Supervision/Verbal cueing   Problem: RH Car Transfers Goal: LTG Patient will perform car transfers with assist (PT) Description: LTG: Patient will perform car transfers with assistance (PT). Flowsheets (Taken 11/27/2022 1932) LTG: Pt will perform car transfers with assist:: Supervision/Verbal cueing   Problem: RH Ambulation Goal: LTG Patient will ambulate in controlled environment (PT) Description: LTG: Patient will ambulate in a controlled environment, # of feet with assistance (PT). Flowsheets (Taken 11/27/2022 1932) LTG: Pt will ambulate in controlled environ  assist needed:: Supervision/Verbal cueing LTG: Ambulation  distance in controlled environment: 135ft using LRAD Goal: LTG Patient will ambulate in home environment (PT) Description: LTG: Patient will ambulate in home environment, # of feet with assistance (PT). Flowsheets (Taken 11/27/2022 1932) LTG: Pt will ambulate in home environ  assist needed:: Supervision/Verbal cueing LTG: Ambulation distance in home environment: 44ft using LRAD   Problem: RH Stairs Goal: LTG Patient will ambulate up and down stairs w/assist (PT) Description: LTG: Patient will ambulate up and down # of stairs with assistance (PT) Flowsheets (Taken 11/27/2022 1932) LTG: Pt will ambulate up/down stairs assist needed:: Supervision/Verbal cueing LTG: Pt will  ambulate up and down number of stairs: 4 steps using HRs

## 2022-11-27 NOTE — Evaluation (Addendum)
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  He had external beam radiation in August 2001 with 54 Gy in 30 fractions on RTOG 9802 protocol.   He declined chemotherapy.    Seizure (HCC)    from history of oligodendroglioma   Past Surgical History:  Past Surgical History:  Procedure Laterality Date   ANKLE SURGERY     right, x 3   brain biopsy  1997, 2001    Assessment / Plan / Recommendation Clinical Impression   Corey Chang is a 42 year old RH- male with history of Oligodendroglioma s/p XRT/declined chemo, complex partial seizure d/o, GERD, AVN Left hip who was admitted on [facility] on 11/23/22 with one day history of left sided numbness with LLE weakness followed by sudden onset of dizziness with diarrhea on day of admission. He was hypertensive at admission and CTA  head/neck showed moderate narrowing of supraclinoid L-ICA, moderate to severe focal narrowing of A2 segment of R-ACA, moderate focal narrowing of proximal BA and small caliber L-VA. 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   2 D echo showed EF 60-65% with no wall abnormalities and  negative bubble study.  Dr. Selina Cooley felt that stroke was due to small vessel disease and recommended DAPT X 3 weeks followed by ASA alone. She also recommended cardiac monitor as well as hypercoag panel due to  young age. JAK2 analysis and erythropoietin levels ordered for work up of polycythemia.   He continues to be limited by left sided sensory deficits, left knee instability limiting mobility as well as decrease in [facility] RUE. Therapy has been working with patient and CIR recommended due to functional decline.     Skilled Therapeutic Interventions          Pt was seen for skilled ST evaluation of cognitive-linguistic skills and dysphagia. Pt was sitting at bedside speaking with Aunt upon SLP entry. Aunt and pt reported no difficulty with thinking skills. SLP completed full cognitive evaluation (CLQT). Pt scored re: attention MOD; memory WNL; executive functions WNL; language WNL; visuospatial skills MILD.  Patient does have ADHD diagnosis at baseline; however, aunt believes this may be exacerbated after watching today's evaluation.  Patient reports he benefits from slowing down.    SLP completed clinical swallow eval (CSE).  Patient was observed with solid and thin liquids.  Patient took consecutive drinks from straw in which delayed cough followed.  SLP encouraged patient to take additional drinks from straw, in which patient demonstrated an immediate throat clearing and cough.  With strong removed, patient did not demonstrate any overt signs or symptoms of aspiration at bedside.  Patient was able to [facility] effectively and thoroughly clear oral cavity.  Patient reports he has a throat clear at baseline and reports a history of acid reflux.  He also reports sometimes food gets stuck (at [place] region).  SLP recommends a modified barium swallow to further identify  impairment.   Pt presents with mild dysarthria characterized by imprecise consonants.  Patient demonstrates awareness of speech impairment and reported worse when he is tired or, " not thinking". SLP encouraged patient to over articulate, and this was successful in producing clear speech at word level.    Pt was left in room with all immediate needs in  reach, with husband in room. Continue with ST POC.     SLP Assessment  Patient will need skilled Speech Lanaguage Pathology Services during CIR admission    Recommendations  SLP Diet Recommendations: Age appropriate regular solids;Thin Liquid Administration via: No straw Medication Administration: Whole meds with liquid Supervision: Patient able to self feed Compensations: Minimize environmental distractions;Slow rate Postural Changes and/or Swallow Maneuvers: Seated upright 90 degrees;Upright 30-60 min after meal Oral Care Recommendations: Oral care BID Patient destination: Home Follow up Recommendations: Outpatient SLP Equipment Recommended: None recommended by SLP    SLP Frequency 3 to 5 out of 7 days   SLP Duration  SLP Intensity  SLP Treatment/Interventions 7-10 days  Minumum of 1-2 x/day, 30 to 90 minutes       Pain Pain Assessment Pain Score: 0-No pain  Prior Functioning    SLP Evaluation Cognition Overall Cognitive Status: Impaired/Different from baseline Orientation Level: Oriented X4 Attention: Alternating;Divided Alternating Attention: Impaired Alternating Attention Impairment: Functional basic Divided Attention: Impaired Divided Attention Impairment: Functional basic Memory: Appears intact Awareness: Appears intact Problem Solving: Appears intact Executive Function: Reasoning;Sequencing;Organizing;Self Monitoring;Self Correcting Reasoning: Appears intact Sequencing: Appears intact Organizing: Appears intact Self Monitoring: Appears intact Self Correcting: Appears intact Behaviors:  (Reports he has a short temper)  Comprehension Auditory Comprehension Overall Auditory Comprehension: Appears within functional limits for tasks assessed Expression Expression Primary Mode of Expression: Verbal Verbal Expression Overall Verbal Expression: Appears within functional limits for tasks assessed Oral Motor Motor Speech Overall Motor Speech:  Impaired Respiration: Within functional limits Phonation: Normal Resonance: Within functional limits Articulation: Impaired Level of Impairment: Conversation Intelligibility: Intelligibility reduced Conversation: 75-100% accurate Motor Planning: Witnin functional limits Effective Techniques: Slow rate;Increased vocal intensity;Over-articulate  Care Tool Care Tool Cognition Ability to hear (with hearing aid or hearing appliances if normally used Ability to hear (with hearing aid or hearing appliances if normally used): 0. Adequate - no difficulty in normal conservation, social interaction, listening to TV   Expression of Ideas and Wants Expression of Ideas and Wants: 4. Without difficulty (complex and basic) - expresses complex messages without difficulty and with speech that is clear and easy to understand   Understanding Verbal and Non-Verbal Content Understanding Verbal and Non-Verbal Content: 3. Usually understands - understands most conversations, but misses some part/intent of message. Requires cues at times to understand  Memory/Recall Ability     PMSV Assessment  PMSV Trial Intelligibility: Intelligibility reduced Conversation: 75-100% accurate  Bedside Swallowing Assessment General    Oral Care Assessment   Ice Chips Ice chips: Not tested Thin Liquid Thin Liquid: Impaired Presentation: Straw Pharyngeal  Phase Impairments: Throat Clearing - Immediate;Cough - Immediate Other Comments: With straw removed, pt was [facility] Nectar Thick Nectar Thick Liquid: Not tested Honey Thick Honey Thick Liquid: Not tested Puree Puree: Not tested Solid Solid: Within functional limits BSE Assessment Suspected Esophageal Findings Suspected Esophageal Findings: Belching Risk for Aspiration Impact on safety and function: Mild aspiration risk  Short Term Goals: Week 1: SLP Short Term Goal 1 (Week 1): Pt will tolerate least restrictive diet with no overt s/sx of aspiration given minA  verbal cues. SLP Short Term Goal  2 (Week 1): Pt will demonstrate alternating attention task (i.e. medication management) with minA verbal cues. SLP Short Term Goal 3 (Week 1): Pt will demonstrate divided attention during a simple task with minA verbal cues.  Refer to Care Plan for Long Term Goals  Recommendations for other services: None   Discharge Criteria: Patient will be discharged from SLP if patient refuses treatment 3 consecutive times without medical reason, if treatment goals not met, if there is a change in medical status, if patient makes no progress towards goals or if patient is discharged from hospital.  The above assessment, treatment plan, treatment alternatives and goals were discussed and mutually agreed upon: by patient  Dorena Bodo 11/27/2022, 12:51 PM

## 2022-11-27 NOTE — Progress Notes (Signed)
4 type 7 stools, since 1930. Reports being constipated on previous unit. Doesn't recall taking a laxative. Paged [place], one time order for imodium, med given at 2156. OOB without assistance x 2 R/T urgency and incontinence. Voiding continently. Denies pain. Corey Chang A

## 2022-11-27 NOTE — Plan of Care (Signed)
  Problem: RH Swallowing Goal: LTG Patient will consume least restrictive diet using compensatory strategies with assistance (SLP) Description: LTG:  Patient will consume least restrictive diet using compensatory strategies with assistance (SLP) 11/27/2022 1252 by Bertrum Sol L, CCC-SLP Flowsheets (Taken 11/27/2022 1250) LTG: Pt Patient will consume least restrictive diet using compensatory strategies with assistance of (SLP): Supervision 11/27/2022 1250 by Bertrum Sol L, CCC-SLP Flowsheets (Taken 11/27/2022 1250) LTG: Pt Patient will consume least restrictive diet using compensatory strategies with assistance of (SLP): Supervision

## 2022-11-27 NOTE — Progress Notes (Signed)
Made Dr. Dalene Carrow aware of multi liquid stools over past 12 hours. Alfredo Martinez A

## 2022-11-27 NOTE — Evaluation (Signed)
Physical Therapy Assessment and Plan  Patient Details  Name: Roscoe Mileto MRN: [id number] Date of Birth: 1980-10-01  PT Diagnosis: Abnormality of gait, Cognitive deficits, Difficulty walking, Hemiparesis non-dominant, Impaired sensation, and Muscle weakness Rehab Potential: Excellent ELOS: ~1.5 weeks   Today's Date: 11/27/2022 PT Individual Time: 1310-1410 PT Individual Time Calculation (min): 60 min    Hospital Problem: Principal Problem:   Acute stroke of medulla oblongata (HCC)   Past Medical History:  Past Medical History:  Diagnosis Date   Allergic rhinitis 04/22/1997   Ankle fracture, right 07/2007   Cholelithiasis    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  He had external beam radiation in August 2001 with 54 Gy in 30 fractions on RTOG 9802 protocol.   He declined chemotherapy.    Seizure (HCC)    from history of oligodendroglioma   Past Surgical History:  Past Surgical History:  Procedure Laterality Date   ANKLE SURGERY     right, x 3   brain biopsy  1997, 2001    Assessment & Plan Clinical Impression: Patient is a 42 y.o.  RH- male with history of Oligodendroglioma s/p XRT/declined chemo, complex partial seizure d/o, GERD, AVN Left hip who was admitted on [facility] on 11/23/22 with one day history of left sided numbness with LLE weakness followed by sudden onset of dizziness with diarrhea on day of admission. He was hypertensive at admission and CTA  head/neck showed moderate narrowing of supraclinoid L-ICA, moderate to severe focal narrowing of A2 segment of R-ACA, moderate focal narrowing of proximal BA and small caliber L-VA. 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   2 D echo showed EF 60-65% with no wall abnormalities and  negative bubble study.  Dr. Selina Cooley felt that stroke was due to small vessel disease and recommended DAPT X 3 weeks followed by ASA alone. She also recommended  cardiac monitor as well as hypercoag panel due to young age. JAK2 analysis and erythropoietin levels ordered for work up of polycythemia.   He continues to be limited by left sided sensory deficits, left knee instability limiting mobility as well as decrease in [facility] RUE. Therapy has been working with patient and CIR recommended due to functional decline. Patient transferred to CIR on 11/26/2022 .   Patient currently requires min with mobility secondary to muscle weakness, decreased cardiorespiratoy endurance, impaired timing and sequencing and unbalanced muscle activation, decreased attention, and decreased standing balance, decreased postural control, hemiplegia, and decreased balance strategies.  Prior to hospitalization, patient was independent  with mobility and lived with Spouse in a House home.  Home access is 2Stairs to enter.  Patient will benefit from skilled PT intervention to maximize safe functional mobility, minimize fall risk, and decrease caregiver burden for planned discharge home with 24 hour supervision.  Anticipate patient will benefit from follow up OP at discharge.  PT - End of Session Activity Tolerance: Tolerates 30+ min activity with multiple rests Endurance Deficit: Yes Endurance Deficit Description: seated rest breaks PT Assessment Rehab Potential (ACUTE/IP ONLY): Excellent PT Barriers to Discharge: Home environment access/layout PT Patient demonstrates impairments in the following area(s): Balance;Safety;Behavior;Sensory;Skin Integrity;Endurance;Motor;Nutrition;Pain PT Transfers Functional Problem(s): Bed Mobility;Bed to Chair;Car;Furniture;Floor PT Locomotion Functional Problem(s): Ambulation;Stairs PT Plan PT Intensity: Minimum of 1-2 x/day ,45 to 90 minutes PT Frequency: 5 out of 7 days PT Duration Estimated Length of Stay: ~1.5 weeks PT Treatment/Interventions: Ambulation/gait training;Community reintegration;DME/adaptive equipment instruction;Neuromuscular  re-education;Psychosocial support;Stair training;UE/LE Strength  taining/ROM;Balance/vestibular training;Functional electrical stimulation;Discharge planning;Pain management;Skin care/wound management;Therapeutic Activities;UE/LE Coordination activities;Cognitive remediation/compensation;Disease management/prevention;Functional mobility training;Patient/family education;Splinting/orthotics;Therapeutic Exercise;Visual/perceptual remediation/compensation PT Transfers Anticipated Outcome(s): supervision using LRAD PT Locomotion Anticipated Outcome(s): supervision using LRAD PT Recommendation Recommendations for Other Services: Therapeutic Recreation consult Therapeutic Recreation Interventions: Outing/community reintergration Follow Up Recommendations: Outpatient PT;24 hour supervision/assistance Patient destination: Home Equipment Recommended: To be determined   PT Evaluation Precautions/Restrictions Precautions Precautions: Fall;Other (comment) Precaution Comments: L knee instability Restrictions Weight Bearing Restrictions: No Pain Pain Assessment Pain Scale: 0-10 Pain Score: 0-No pain Pain Interference Pain Interference Pain Effect on Sleep: 0. Does not apply - I have not had any pain or hurting in the past 5 days Pain Interference with Therapy Activities: 1. Rarely or not at all Pain Interference with Day-to-Day Activities: 1. Rarely or not at all Home Living/Prior Functioning Home Living Available Help at Discharge: Family;Available 24 hours/day (works from home) Type of Home: House Home Access: Stairs to enter Secretary/administrator of Steps: 2 Entrance Stairs-Rails: Right;Left;Can reach both Home Layout: Able to live on main level with bedroom/bathroom;Multi-level Additional Comments: pt lives with wife (who works from home) and 2 children. 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 & 12y.o.)) Prior Function Level of Independence:  Independent with gait;Independent with transfers;Independent with homemaking with ambulation  Able to Take Stairs?: Yes Driving: Yes Vocation: Full time employment Vocation Requirements: works for [employer] Vision/Perception  Vision - Assessment Eye Alignment: Within Functional Limits Tracking/Visual Pursuits: Able to track stimulus in all quads without difficulty Saccades: Additional eye shifts occurred during testing (with both horizontal and vertical saccades in L eye) Convergence: Within functional limits Perception Perception: Within Functional Limits Praxis Praxis: Intact  Cognition Overall Cognitive Status: Impaired/Different from baseline Arousal/Alertness: Awake/alert Orientation Level: Oriented X4 Year: 2024 Month: May Day of Week: Correct Attention: Focused;Sustained Focused Attention: Appears intact Sustained Attention: Appears intact Awareness: Appears intact Behaviors:  (Reports he has a short temper) Safety/Judgment: Appears intact Sensation Sensation Light Touch: Impaired Detail Central sensation comments: reports L LE is "a little tingly and not quiet as sensative" - able to feel touch on screen Light Touch Impaired Details: Impaired LLE Hot/Cold: Not tested Proprioception: Impaired Detail Proprioception Impaired Details: Impaired LLE Stereognosis: Not tested Coordination Gross Motor Movements are Fluid and Coordinated: No Coordination and Movement Description: impaired due to L hemiparesis Motor  Motor Motor: Other (comment);Abnormal tone Motor - Skilled Clinical Observations: L hemiparesis   Trunk/Postural Assessment  Cervical Assessment Cervical Assessment: Within Functional Limits Thoracic Assessment Thoracic Assessment: Exceptions to [facility] (mild rounded shoulders) Lumbar Assessment Lumbar Assessment: Within Functional Limits Postural Control Postural Control: Within Functional Limits  Balance Balance Balance Assessed: Yes Static Sitting  Balance Static Sitting - Balance Support: Feet supported Static Sitting - Level of Assistance: 5: Stand by assistance Dynamic Sitting Balance Dynamic Sitting - Balance Support: Feet supported Dynamic Sitting - Level of Assistance: 5: Stand by assistance;4: Min assist (CGA) Static Standing Balance Static Standing - Balance Support: During functional activity Static Standing - Level of Assistance: 4: Min assist (CGA) Dynamic Standing Balance Dynamic Standing - Balance Support: During functional activity Dynamic Standing - Level of Assistance: 4: Min assist (CGA-min A) Extremity Assessment      RLE Assessment RLE Assessment: Within Functional Limits Active Range of Motion (AROM) Comments: WFL/WNL General Strength Comments: assessed in sitting LLE Assessment LLE Assessment: Exceptions to [facility] General Strength Comments: assessed in sitting; pt with hx of L hip AVN with pain limiting  LLE Strength Left Hip Flexion: 3-/5 Left Knee Flexion: 2+/5 Left Knee Extension: 4/5 Left Ankle Dorsiflexion: 3/5 Left Ankle Plantar Flexion: 4-/5  Care Tool Care Tool Bed Mobility Roll left and right activity   Roll left and right assist level: Independent with assistive device    Sit to lying activity   Sit to lying assist level: Supervision/Verbal cueing    Lying to sitting on side of bed activity   Lying to sitting on side of bed assist level: the ability to move from lying on the back to sitting on the side of the bed with no back support.: Supervision/Verbal cueing     Care Tool Transfers Sit to stand transfer   Sit to stand assist level: Minimal Assistance - Patient > 75%    Chair/bed transfer   Chair/bed transfer assist level: Minimal Assistance - Patient > 75%     Psychologist, clinical transfer assist level: Minimal Assistance - Patient > 75%      Care Tool Locomotion Ambulation   Assist level: Minimal Assistance - Patient > 75% Assistive device: Hand held  assist Max distance: 273ft  Walk 10 feet activity   Assist level: Minimal Assistance - Patient > 75% Assistive device: Hand held assist   Walk 50 feet with 2 turns activity   Assist level: Minimal Assistance - Patient > 75% Assistive device: Hand held assist  Walk 150 feet activity   Assist level: Minimal Assistance - Patient > 75% Assistive device: Hand held assist  Walk 10 feet on uneven surfaces activity   Assist level: Minimal Assistance - Patient > 75% Assistive device: Hand held assist;Other (comment) (railing)  Stairs   Assist level: Minimal Assistance - Patient > 75% Stairs assistive device: 2 hand rails Max number of stairs: 12  Walk up/down 1 step activity   Walk up/down 1 step (curb) assist level: Minimal Assistance - Patient > 75% Walk up/down 1 step or curb assistive device: 2 hand rails  Walk up/down 4 steps activity   Walk up/down 4 steps assist level: Minimal Assistance - Patient > 75% Walk up/down 4 steps assistive device: 2 hand rails  Walk up/down 12 steps activity   Walk up/down 12 steps assist level: Minimal Assistance - Patient > 75% Walk up/down 12 steps assistive device: 2 hand rails  Pick up small objects from floor   Pick up small object from the floor assist level: Minimal Assistance - Patient > 75% Pick up small object from the floor assistive device: HHA  Wheelchair Is the patient using a wheelchair?: No          Wheel 50 feet with 2 turns activity      Wheel 150 feet activity        Refer to Care Plan for Long Term Goals  SHORT TERM GOAL WEEK 1 PT Short Term Goal 1 (Week 1): Pt will perform sit<>stands using LRAD with CGA PT Short Term Goal 2 (Week 1): Pt will perform bed<>chair transfers using LRAD with CGA PT Short Term Goal 3 (Week 1): Pt will ambulate at least 163ft using LRAD with CGA PT Short Term Goal 4 (Week 1): Pt will navigate at least 4 steps using HRs with CGA  Recommendations for other services: Therapeutic Recreation   Outing/community reintegration  Skilled Therapeutic Intervention Pt received supine in bed awake and agreeable to therapy session. Evaluation completed (see details above) with patient education regarding purpose of PT evaluation,  PT POC and goals, therapy schedule, weekly team meetings, and other CIR information including safety plan and fall risk safety. Pt performed the below functional mobility tasks with the specified levels of skilled cuing and assistance. Pt demonstrates significant L knee hyperextension with it snapping in/out of hyperextension during stance phase of gait - therapist donned Swedish knee cage to block hyperextension because pt demos too quick of a gait speed to manually block it. Pt will benefit from exercises to promote increased hamstring strength because hyperextension is likely related to unbalanced muscle activation of quad strength overpowering hamstrings. During stair navigation pt requires cuing to include L UE into the task. At end of session, pt left supine in bed with needs in reach and bed alarm on.  Mobility Bed Mobility Bed Mobility: Supine to Sit;Sit to Supine Supine to Sit: Supervision/Verbal cueing Sit to Supine: Supervision/Verbal cueing Transfers Transfers: Sit to Stand;Stand to Sit;Stand Pivot Transfers Sit to Stand: Minimal Assistance - Patient > 75% Stand to Sit: Minimal Assistance - Patient > 75% Stand Pivot Transfers: Minimal Assistance - Patient > 75% Stand Pivot Transfer Details: Verbal cues for sequencing;Verbal cues for technique;Verbal cues for precautions/safety;Tactile cues for weight shifting;Tactile cues for initiation Transfer (Assistive device): 1 person hand held assist Locomotion  Gait Ambulation: Yes Gait Assistance: Minimal Assistance - Patient > 75% Gait Distance (Feet): 200 Feet (150+200) Assistive device: 1 person hand held assist;Other (Comment) (L HHA, donned L Swedish knee cage after first ~16ft of gait due to extreme L knee  hyperextension) Gait Assistance Details: Verbal cues for technique;Tactile cues for sequencing;Verbal cues for gait pattern;Other (comment) (education on L knee positioning) Gait Gait: Yes Gait Pattern: Impaired Gait Pattern: Step-through pattern;Poor foot clearance - left;Narrow base of support;Decreased stance time - left;Left genu recurvatum (L toes occasionally catching during swing, L knee significant hyperextension) Gait velocity: decreased Stairs / Additional Locomotion Stairs: Yes Stairs Assistance: Minimal Assistance - Patient > 75% Stair Management Technique: Alternating pattern;Two rails;Forwards Number of Stairs: 12 Height of Stairs: 6 Ramp: Minimal Assistance - Patient >75% (L HHA and railing) Wheelchair Mobility Wheelchair Mobility: No   Discharge Criteria: Patient will be discharged from PT if patient refuses treatment 3 consecutive times without medical reason, if treatment goals not met, if there is a change in medical status, if patient makes no progress towards goals or if patient is discharged from hospital.  The above assessment, treatment plan, treatment alternatives and goals were discussed and mutually agreed upon: by patient  Ginny Forth , PT, DPT, NCS, CSRS 11/27/2022, 1:01 PM

## 2022-11-28 DIAGNOSIS — I639 Cerebral infarction, unspecified: Secondary | ICD-10-CM | POA: Diagnosis not present

## 2022-11-28 DIAGNOSIS — E871 Hypo-osmolality and hyponatremia: Secondary | ICD-10-CM | POA: Diagnosis not present

## 2022-11-28 LAB — BASIC METABOLIC PANEL
Anion gap: 10 (ref 5–15)
BUN: 14 mg/dL (ref 6–20)
CO2: 20 mmol/L — ABNORMAL LOW (ref 22–32)
Calcium: 9.3 mg/dL (ref 8.9–10.3)
Chloride: 100 mmol/L (ref 98–111)
Creatinine, Ser: 1.03 mg/dL (ref 0.61–1.24)
GFR, Estimated: >60 mL/min (ref >60)
Glucose, Bld: 92 mg/dL (ref 70–99)
Potassium: 3.5 mmol/L (ref 3.5–5.1)
Sodium: 130 mmol/L — ABNORMAL LOW (ref 135–145)

## 2022-11-28 LAB — PROTEIN C, TOTAL: Protein C, Total: 104 % (ref 60–150)

## 2022-11-28 NOTE — Progress Notes (Signed)
PROGRESS NOTE   Subjective/Complaints:  Pt doing well today, slept well overnight, denies pain. Diarrhea resolved as of midday yesterday! Doing much better.  Urinating fine. Denies any other complaints or concerns.   ROS: +diarrhea-resolved. Denies fevers, chills, CP, SOB, abd pain, N/V/C, new/worsening paresthesias/weakness, or any other complaints at this time.    Objective:   No results found. Recent Labs    11/26/22 0433 11/27/22 0520  WBC  --  6.6  HGB 18.3* 18.0*  HCT  --  51.7  PLT  --  218   Recent Labs    11/27/22 0520 11/28/22 0532  NA 130* 130*  K 3.7 3.5  CL 101 100  CO2 18* 20*  GLUCOSE 97 92  BUN 14 14  CREATININE 1.08 1.03  CALCIUM 9.6 9.3    Intake/Output Summary (Last 24 hours) at 11/28/2022 1137 Last data filed at 11/28/2022 1004 Gross per 24 hour  Intake 480 ml  Output --  Net 480 ml        Physical Exam: Vital Signs Blood pressure 114/88, pulse 82, temperature 97.6 F (36.4 C), resp. rate 19, height 5\' 11"  (1.803 m), weight 86.4 kg, SpO2 96 %.  Constitutional: No apparent distress. Appropriate appearance for age. Sitting in wheelchair. HENT: No JVD. Neck Supple. Trachea midline. Atraumatic, normocephalic. + R HOH Eyes: PERRLA. EOMI. Visual fields grossly intact.   Cardiovascular: RRR, no murmurs/rub/gallops. No Edema. Peripheral pulses 2+  Respiratory: CTAB. No rales, rhonchi, or wheezing. On RA.  Abdomen: + bowel sounds, normoactive. No distention or tenderness.  Skin: C/D/I. No apparent lesions. Neuro: very mild dysarthria MsK: mild weakness in LUE but able to lift off bed and point to opposite side  PRIOR EXAMS: MSK:      No apparent deformity.      Strength:                RUE: 5/5 SA, 5/5 EF, 5/5 EE, 5/5 WE, 5/5 FF, 5/5 FA                 LUE: 4-/5 throughout                RLE: 5/5 HF, 5/5 KE, 5/5 DF, 5/5 EHL, 5/5 PF                 LLE:  5-/5 throughout   Neurologic  exam: Horizontal nystagmus Left>right.  Cognition: AAO to person, place, time and event.  Language: Fluent, No substitutions or neoglisms.  mild dysarthria. Names 3/3 objects correctly.  Memory: Recalls 3/3 objects at 5 minutes. No apparent deficits  Insight: Good  insight into current condition.  Mood: Pleasant affect, appropriate mood.  Sensation: Left hemibody sensory deficits  Reflexes: 2+ in BL UE and LEs. Negative Hoffman's and babinski signs bilaterally.  CN:   Left facial droop, mild L V2-3 sensory loss Decreased L shoulder shrug   Coordination:  Dysmetria with left FTN and decrease in motor control LLE.  Spasticity: MAS 0 in all extremities.  Assessment/Plan: 1. Functional deficits which require 3+ hours per day of interdisciplinary therapy in a comprehensive inpatient rehab setting. Physiatrist is providing close team supervision and 24 hour  management of active medical problems listed below. Physiatrist and rehab team continue to assess barriers to discharge/monitor patient progress toward functional and medical goals  Care Tool:  Bathing    Body parts bathed by patient: Left arm, Chest, Abdomen, Front perineal area, Right upper leg, Left upper leg, Face   Body parts bathed by helper: Right arm, Buttocks, Right lower leg, Left lower leg     Bathing assist Assist Level: Minimal Assistance - Patient > 75%     Upper Body Dressing/Undressing Upper body dressing   What is the patient wearing?: Pull over shirt    Upper body assist Assist Level: Minimal Assistance - Patient > 75%    Lower Body Dressing/Undressing Lower body dressing      What is the patient wearing?: Pants, Underwear/pull up     Lower body assist Assist for lower body dressing: Moderate Assistance - Patient 50 - 74%     Toileting Toileting    Toileting assist Assist for toileting: Moderate Assistance - Patient 50 - 74%     Transfers Chair/bed transfer  Transfers assist     Chair/bed  transfer assist level: Minimal Assistance - Patient > 75%     Locomotion Ambulation   Ambulation assist      Assist level: Minimal Assistance - Patient > 75% Assistive device: Hand held assist Max distance: 236ft   Walk 10 feet activity   Assist     Assist level: Minimal Assistance - Patient > 75% Assistive device: Hand held assist   Walk 50 feet activity   Assist    Assist level: Minimal Assistance - Patient > 75% Assistive device: Hand held assist    Walk 150 feet activity   Assist    Assist level: Minimal Assistance - Patient > 75% Assistive device: Hand held assist    Walk 10 feet on uneven surface  activity   Assist     Assist level: Minimal Assistance - Patient > 75% Assistive device: Hand held assist, Other (comment) (railing)   Wheelchair     Assist Is the patient using a wheelchair?: No             Wheelchair 50 feet with 2 turns activity    Assist            Wheelchair 150 feet activity     Assist          Blood pressure 114/88, pulse 82, temperature 97.6 F (36.4 C), resp. rate 19, height 5\' 11"  (1.803 m), weight 86.4 kg, SpO2 96 %.  Medical Problem List and Plan: 1. 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  Antithrombotics: -DVT/anticoagulation:  Pharmaceutical: Lovenox 40mg  QD             -antiplatelet therapy: ASA 81 mg, Plavix 75 mg (x21 days) 3. Pain Management: Tylenol prn.  4. Mood/Behavior/Sleep: LCSW to follow for evaluation and support.  --insomnia due to noises in the hospital/bored and sleeping during the day. Trazodone prn ordered.              -antipsychotic agents: N/A 5. Neuropsych/cognition: This patient is capable of making decisions on his own behalf. 6. Skin/Wound Care: Routine pressure relief measures.  7. Fluids/Electrolytes/Nutrition: Monitor I/O. Monitor  routine labs 8. Hyponatremia: Question baseline.  -  11/27/22 Na 130 this morning, down slightly from 133, monitor for now and recheck BMP in AM -11/28/22 Na stable at 130, this may be his baseline, monitor on routine labs for now 9. Hyperlipidemia: LDL-192, Trig-254, HDL-35             --now on high dose statin, Atorvastatin 80mg  QD.  10. H/o Seizures: Started 1998 and last 02/2022 per records. Followed by Dr. Karel Jarvis.             --on Lamictal  XR 200 mg/pm and Keppra 1500 mg BID 11. Constipation: Has resolved and does not want any laxative scheduled. PRNs available 12. Seasonal allergies: continue claritin 10mg  QD 13. Polycythemia:  -11/27/22 elevated homocysteine 24.7, Hgb stable 18 range, defer to primary team, pending hypercoag panel 14. Diarrhea, started 11/26/22 -11/27/22 several liquidy stools overnight, imodium 2mg  given x2 and not much help; no obvious source but may have received miralax prior to arrival; no other s/sx, doubt acute infection or need for emergent work up for now; requesting pepto and gas X; ordered Pepto bismol 30ml q4h PRN, simethicone 80mg  q6h PRN, and fibercon 625mg  QD.  -11/28/22 resolved!    LOS: 2 days A FACE TO FACE EVALUATION WAS PERFORMED  [address] 11/28/2022, 11:37 AM

## 2022-11-29 DIAGNOSIS — I639 Cerebral infarction, unspecified: Secondary | ICD-10-CM | POA: Diagnosis not present

## 2022-11-29 LAB — CBC
HCT: 49.4 % (ref 39.0–52.0)
Hemoglobin: 16.9 g/dL (ref 13.0–17.0)
MCH: 30.1 pg (ref 26.0–34.0)
MCHC: 34.2 g/dL (ref 30.0–36.0)
MCV: 88.1 fL (ref 80.0–100.0)
Platelets: 232 10*3/uL (ref 150–400)
RBC: 5.61 MIL/uL (ref 4.22–5.81)
RDW: 13 % (ref 11.5–15.5)
WBC: 5.4 10*3/uL (ref 4.0–10.5)
nRBC: 0 % (ref 0.0–0.2)

## 2022-11-29 LAB — BASIC METABOLIC PANEL
Anion gap: 11 (ref 5–15)
BUN: 13 mg/dL (ref 6–20)
CO2: 24 mmol/L (ref 22–32)
Calcium: 9.8 mg/dL (ref 8.9–10.3)
Chloride: 97 mmol/L — ABNORMAL LOW (ref 98–111)
Creatinine, Ser: 1.09 mg/dL (ref 0.61–1.24)
GFR, Estimated: >60 mL/min (ref >60)
Glucose, Bld: 83 mg/dL (ref 70–99)
Potassium: 3.6 mmol/L (ref 3.5–5.1)
Sodium: 132 mmol/L — ABNORMAL LOW (ref 135–145)

## 2022-11-29 MED ORDER — ACETAMINOPHEN 325 MG PO TABS: 325.0000 mg | ORAL_TABLET | ORAL | Status: AC | PRN

## 2022-11-29 NOTE — Progress Notes (Signed)
Speech Language Pathology Daily Session Note  Patient Details  Name: Corey Chang MRN: [id number] Date of Birth: [birth year]  Today's Date: 11/29/2022 SLP Individual Time: 1000-1100 SLP Individual Time Calculation (min): 60 min  Short Term Goals: Week 1: SLP Short Term Goal 1 (Week 1): Pt will tolerate least restrictive diet with no overt s/sx of aspiration given minA verbal cues. SLP Short Term Goal 2 (Week 1): Pt will demonstrate alternating attention task (i.e. medication management) with minA verbal cues. SLP Short Term Goal 3 (Week 1): Pt will demonstrate divided attention during a simple task with minA verbal cues.  Skilled Therapeutic Interventions:  Pt was seen in am to address cognitive re- training. Pt was alert and seated upright in recliner upon SLP arrival. Pt verbalized recent medical hx and timeline indep. He reported hx of ADHD however, he denies recent changes in attention as a result of CVA. Per pt, he typically manages attention with use of tobacco. However, he feels he has self regulated attention well during hospitalization in the absence of substance. SLP challenged pt in structured alternating and divided attention task. Pt warranted min A for divided attention and sup A for alternating attention. During guided conversational exchange pt benefited from min to sup A for topic maintenance. Pt left seated upright in recliner with chair alarm active and call button within reach.   Pain Pain Assessment Pain Scale: 0-10 Pain Score: 0-No pain  Therapy/Group: Individual Therapy  Renaee Munda 11/29/2022, 3:40 PM

## 2022-11-29 NOTE — Progress Notes (Signed)
Inpatient Rehabilitation  Patient information reviewed and entered into eRehab system by Nycere Presley M. Kenya Kook, M.A., CCC/SLP, PPS Coordinator.  Information including medical coding, functional ability and quality indicators will be reviewed and updated through discharge.    

## 2022-11-29 NOTE — Progress Notes (Signed)
PROGRESS NOTE   Subjective/Complaints:  Pt with Left groin pain which occurs when initially standing , hx AVN Left hip   ROS: No CP, SOB, N/V/D   Objective:   No results found. Recent Labs    11/27/22 0520  WBC 6.6  HGB 18.0*  HCT 51.7  PLT 218    Recent Labs    11/27/22 0520 11/28/22 0532  NA 130* 130*  K 3.7 3.5  CL 101 100  CO2 18* 20*  GLUCOSE 97 92  BUN 14 14  CREATININE 1.08 1.03  CALCIUM 9.6 9.3     Intake/Output Summary (Last 24 hours) at 11/29/2022 0932 Last data filed at 11/29/2022 0859 Gross per 24 hour  Intake 960 ml  Output --  Net 960 ml         Physical Exam: Vital Signs Blood pressure 114/75, pulse 65, temperature 97.7 F (36.5 C), temperature source Oral, resp. rate 18, height 5\' 11"  (1.803 m), weight 86.4 kg, SpO2 97 %.   General: No acute distress Mood and affect are appropriate Heart: Regular rate and rhythm no rubs murmurs or extra sounds Lungs: Clear to auscultation, breathing unlabored, no rales or wheezes Abdomen: Positive bowel sounds, soft nontender to palpation, nondistended Extremities: No clubbing, cyanosis, or edema Skin: No evidence of breakdown, no evidence of rash   Skin: C/D/I. No apparent lesions. Neuro: very mild dysarthria MsK: no pain with left hip int/ext rotation   PRIOR EXAMS: MSK:      No apparent deformity.      Strength:                RUE: 5/5 SA, 5/5 EF, 5/5 EE, 5/5 WE, 5/5 FF, 5/5 FA                 LUE: 4-/5 throughout                RLE: 5/5 HF, 5/5 KE, 5/5 DF, 5/5 EHL, 5/5 PF                 LLE:  5-/5 throughout   Neurologic exam: Horizontal nystagmus Left>right.  Cognition: AAO to person, place, time and event.  Language: Fluent, No substitutions or neoglisms.  mild dysarthria. Names 3/3 objects correctly.  Memory: Recalls 3/3 objects at 5 minutes. No apparent deficits  Insight: Good  insight into current condition.  Mood: Pleasant  affect, appropriate mood.  Sensation: Left hemibody sensory deficits  Reflexes: 2+ in BL UE and LEs. Negative Hoffman's and babinski signs bilaterally.  CN:   Left facial droop, mild L V2-3 sensory loss Decreased L shoulder shrug   Coordination:  Dysmetria with left FTN and decrease in motor control LLE.  Spasticity: MAS 0 in all extremities.  Assessment/Plan: 1. Functional deficits which require 3+ hours per day of interdisciplinary therapy in a comprehensive inpatient rehab setting. Physiatrist is providing close team supervision and 24 hour management of active medical problems listed below. Physiatrist and rehab team continue to assess barriers to discharge/monitor patient progress toward functional and medical goals  Care Tool:  Bathing    Body parts bathed by patient: Left arm, Chest, Abdomen, Front perineal area, Right  upper leg, Left upper leg, Face   Body parts bathed by helper: Right arm, Buttocks, Right lower leg, Left lower leg     Bathing assist Assist Level: Minimal Assistance - Patient > 75%     Upper Body Dressing/Undressing Upper body dressing   What is the patient wearing?: Pull over shirt    Upper body assist Assist Level: Minimal Assistance - Patient > 75%    Lower Body Dressing/Undressing Lower body dressing      What is the patient wearing?: Pants, Underwear/pull up     Lower body assist Assist for lower body dressing: Moderate Assistance - Patient 50 - 74%     Toileting Toileting    Toileting assist Assist for toileting: Moderate Assistance - Patient 50 - 74%     Transfers Chair/bed transfer  Transfers assist     Chair/bed transfer assist level: Minimal Assistance - Patient > 75%     Locomotion Ambulation   Ambulation assist      Assist level: Minimal Assistance - Patient > 75% Assistive device: Hand held assist Max distance: 285ft   Walk 10 feet activity   Assist     Assist level: Minimal Assistance - Patient >  75% Assistive device: Hand held assist   Walk 50 feet activity   Assist    Assist level: Minimal Assistance - Patient > 75% Assistive device: Hand held assist    Walk 150 feet activity   Assist    Assist level: Minimal Assistance - Patient > 75% Assistive device: Hand held assist    Walk 10 feet on uneven surface  activity   Assist     Assist level: Minimal Assistance - Patient > 75% Assistive device: Hand held assist, Other (comment) (railing)   Wheelchair     Assist Is the patient using a wheelchair?: No             Wheelchair 50 feet with 2 turns activity    Assist            Wheelchair 150 feet activity     Assist          Blood pressure 114/75, pulse 65, temperature 97.7 F (36.5 C), temperature source Oral, resp. rate 18, height 5\' 11"  (1.803 m), weight 86.4 kg, SpO2 97 %.  Medical Problem List and Plan: 1. 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  Antithrombotics: -DVT/anticoagulation:  Pharmaceutical: Lovenox 40mg  QD             -antiplatelet therapy: ASA 81 mg, Plavix 75 mg (x21 days) 3. Pain Management: Tylenol prn.  4. Mood/Behavior/Sleep: LCSW to follow for evaluation and support.  --insomnia due to noises in the hospital/bored and sleeping during the day. Trazodone prn ordered.              -antipsychotic agents: N/A 5. Neuropsych/cognition: This patient is capable of making decisions on his own behalf. 6. Skin/Wound Care: Routine pressure relief measures.  7. Fluids/Electrolytes/Nutrition: Monitor I/O. Monitor routine labs 8. Hyponatremia: Question baseline.  -11/27/22 Na 130 this morning, down slightly from 133, monitor for now and recheck BMP in AM -11/28/22 Na stable at 130, this may be his baseline, monitor on routine labs for now 9. Hyperlipidemia: LDL-192, Trig-254, HDL-35              --  now on high dose statin, Atorvastatin 80mg  QD.  10. 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 Karel Jarvis.             --on Lamictal  XR 200 mg/pm and Keppra 1500 mg BID 11. Constipation: Has resolved and does not want any laxative scheduled. PRNs available 12. Seasonal allergies: continue claritin 10mg  QD 13. Polycythemia:  -11/27/22 elevated homocysteine 24.7, Hgb stable 18 range, defer to primary team,     Latest Ref Rng & Units 11/27/2022    5:20 AM 11/26/2022    4:33 AM 11/24/2022    6:07 AM  CBC  WBC 4.0 - 10.5 K/uL 6.6   4.5   Hemoglobin 13.0 - 17.0 g/dL 16.1  09.6  04.5   Hematocrit 39.0 - 52.0 % 51.7   49.1   Platelets 150 - 400 K/uL 218   238     14. Diarrhea, started 11/26/22 -11/27/22 several liquidy stools overnight, imodium 2mg  given x2 and not much help; no obvious source but may have received miralax prior to arrival; no other s/sx, doubt acute infection or need for emergent work up for now; requesting pepto and gas X; ordered Pepto bismol 30ml q4h PRN, simethicone 80mg  q6h PRN, and fibercon 625mg  QD.  -11/28/22 resolved!    LOS: 3 days A FACE TO FACE EVALUATION WAS PERFORMED  Erick Colace 11/29/2022, 9:32 AM

## 2022-11-29 NOTE — Progress Notes (Signed)
Patient ID: Corey Chang, male   DOB: Feb 18, 1981, 42 y.o.   MRN: [id number] Met with the patient to review current situation, rehab process, team conference and plan of care. Reviewed secondary risk management including HTN, HLD LDL 192/Trig 254); reported aware of elevated triglycerides; drinks two beers daily and eats a lot of mayo with sandwiches.  Reviewed medications; Keppra, ASA (life) with Plavix (x 18 days, lipitor.  Given information on JAK 2 mutation and polycythemia for reference. Denies pain, insomnia or constipation. Continue to follow along to address educational needs to facilitate preparation for discharge. Pamelia Hoit

## 2022-11-29 NOTE — Progress Notes (Signed)
Occupational Therapy Session Note  Patient Details  Name: Corey Chang MRN: [id number] Date of Birth: Nov 20, 1980  Today's Date: 11/29/2022 OT Individual Time: 0803-0900 OT Individual Time Calculation (min): 57 min    Short Term Goals: Week 1:  OT Short Term Goal 1 (Week 1): STG=LTG d/t ELOS  Skilled Therapeutic Interventions/Progress Updates:      Pt received semi-reclined in bed presenting to be in good spirits receptive to skilled OT session reporting 0/10 pain- OT offering intermittent rest breaks, repositioning, and therapeutic support to optimize participation in therapy session. Pt dressed and ready for the day upon OT arrival politely denying need for BADLs. Pt transitioned to EOB supervision with HOB elevated. Sit>stand CGA. Pt completed functional mobility to therapy gym using RW with CGA- mod VB cues required for attention to L side to maintain grasp on RW. L foot placement, and step through pattern.   Pt complete grip strength test seated in chair with back with 3 trials completed in each hand to assess difference between R/L hand and track progress during hospital stay. Pt with significantly decreased strength in LUE 2/2 CVA.   R Hand (dominant) Trial 1: 144 lbs Trail 2: 162 lbs Trail 3: 173 lbs Average: 159.67 lbs  L Hand (hemiparesis) Trial 1: 65 lb Trail 2: 65 lbs Trail 3: 69 lbs Average: 66.33  Attempted to engaged Pt in completing 9-hole peg test. During practice round, pt unable to complete with LUE d/t decreased FM strength, coordination, and in-hand manipulation skills. Terminated assessment prior to completion d/t pt not able to complete with ++time.  Engaged Pt in completing FM pegs task using medium sized pegs to increase FM strength and codoination for BADL tasks. Functional reaching incorporated into activity with pegs presented in superior and inferior visual fields at eye level or higher. Pt able to retrieve and don 12/12 pegs without dropping. Pt noted  to have compensatory shoulder elevation movements d/t unequal muscle activation and decreased strength, however Pt able to correct with min VB and tactile cues. Pt then reversed task- removed pegs from board using LUE.   Donned magnetic checkers onto vertically slanted checker board in standing for dual tasking challenge with 2# wrist weight donned on LUE for increased proprioceptive feedback and increased challenge.  Pt required CGA to light min A for dynamic standing balance during tasks with L knee occasionally buckling, however Pt able to catch his balance and re-engage L knee with min tactile cues. Pt required min VB cues to correct shoulder elevation and min tactile/VB cues to initiate wrist extension during task as Pt attempts to don magnets using compensatory grasp pattern. Pt with decreased in-hand manipulation skills requiring checkers to be turned up right in order to pinch and manipulate checker to place on board. Facilitated prolonged pinch with Pt donning on higher levels of checker board with Pt required to maintain pinch while transporting checker to top level of checker board with min dropping noted.   Pt ambulated back to room using RW with CGA with mod cues to LLE placement and maintaining grasp on RW with LUE. Provided Pt with blue foam block and miniature foam blocks to work on [employer] in between therapy sessions with education provided.   Pt was left resting in wc with call bell in reach, seat belt alarm on, and all needs met.   Therapy Documentation Precautions:  Precautions Precautions: Fall, Other (comment) Precaution Comments: L knee instability Restrictions Weight Bearing Restrictions: No   Therapy/Group: Individual Therapy  Army Fossa 11/29/2022, 7:57 AM

## 2022-11-29 NOTE — IPOC Note (Signed)
Overall Plan of Care [facility]) Patient Details Name: Corey Chang MRN: [id number] DOB: 08/18/1980  Admitting Diagnosis: Acute stroke of medulla oblongata [facility])  Hospital Problems: Principal Problem:   Acute stroke of medulla oblongata (HCC)     Functional Problem List: Nursing Bladder, Endurance, Safety, Medication Management  PT Balance, Safety, Behavior, Sensory, Skin Integrity, Endurance, Motor, Nutrition, Pain  OT Balance, Safety, Sensory, Cognition, Skin Integrity, Endurance, Motor, Pain, Perception  SLP    TR         Basic ADL's: OT Grooming, Bathing, Toileting, Dressing     Advanced  ADL's: OT Simple Meal Preparation     Transfers: PT Bed Mobility, Bed to Chair, Car, Furniture, Floor  OT Tub/Shower, Technical brewer: PT Ambulation, Stairs     Additional Impairments: OT Fuctional Use of Upper Extremity  SLP Swallowing, Social Cognition, Communication expression Attention  TR      Anticipated Outcomes Item Anticipated Outcome  Self Feeding Independent  Swallowing  sup   Basic self-care  Supervision-CGA  Toileting  Supervision-CGA   Bathroom Transfers Supervision-CGA  Bowel/Bladder  Manage w mod I  Transfers  supervision using LRAD  Locomotion  supervision using LRAD  Communication  sup  Cognition  min  Pain  na  Safety/Judgment  manage w cues   Therapy Plan: PT Intensity: Minimum of 1-2 x/day ,45 to 90 minutes PT Frequency: 5 out of 7 days PT Duration Estimated Length of Stay: ~1.5 weeks OT Intensity: Minimum of 1-2 x/day, 45 to 90 minutes OT Frequency: 5 out of 7 days OT Duration/Estimated Length of Stay: 7-10 days SLP Intensity: Minumum of 1-2 x/day, 30 to 90 minutes SLP Frequency: 3 to 5 out of 7 days SLP Duration/Estimated Length of Stay: 7-10 days   Team Interventions: Nursing Interventions Bowel Management, Disease Management/Prevention, Medication Management, Discharge Planning, Patient/Family Education  PT interventions  Ambulation/gait training, Community reintegration, DME/adaptive equipment instruction, Neuromuscular re-education, Psychosocial support, Stair training, UE/LE Strength taining/ROM, Warden/ranger, Functional electrical stimulation, Discharge planning, Pain management, Skin care/wound management, Therapeutic Activities, UE/LE Coordination activities, Cognitive remediation/compensation, Disease management/prevention, Functional mobility training, Patient/family education, Splinting/orthotics, Therapeutic Exercise, Visual/perceptual remediation/compensation  OT Interventions Balance/vestibular training, Discharge planning, Functional electrical stimulation, Pain management, Self Care/advanced ADL retraining, Therapeutic Activities, UE/LE Coordination activities, Cognitive remediation/compensation, Disease mangement/prevention, Functional mobility training, Patient/family education, Skin care/wound managment, Therapeutic Exercise, Community reintegration, DME/adaptive equipment instruction, Neuromuscular re-education, Psychosocial support, Splinting/orthotics, UE/LE Strength taining/ROM, Wheelchair propulsion/positioning  SLP Interventions    TR Interventions    SW/CM Interventions     Barriers to Discharge MD  Medical stability  Nursing Decreased caregiver support, Home environment access/layout 2 level main B+B, 2 ste no rails w spouse;pt lives with wife (who works from home) and 2 children. 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 The team will be addressing functional mobility, strength, stamina, balance, safety, adaptive techniques  and equipment, self-care, bowel and bladder mgt, patient and caregiver education, balance and safety awareness. Goals have been set at Mod I. Anticipated discharge destination is home with wife.        See Team Conference Notes for weekly updates to the plan of care

## 2022-11-29 NOTE — Progress Notes (Signed)
Inpatient Rehabilitation Center Individual Statement of Services  Patient Name:  Corey Chang  Date:  11/29/2022  Welcome to the Inpatient Rehabilitation Center.  Our goal is to provide you with an individualized program based on your diagnosis and situation, designed to meet your specific needs.  With this comprehensive rehabilitation program, you will be expected to participate in at least 3 hours of rehabilitation therapies Monday-Friday, with modified therapy programming on the weekends.  Your rehabilitation program will include the following services:  Physical Therapy (PT), Occupational Therapy (OT), Speech Therapy (ST), 24 hour per day rehabilitation nursing, Therapeutic Recreaction (TR), Neuropsychology, Care Coordinator, Rehabilitation Medicine, Nutrition Services, Pharmacy Services, and Other  Weekly team conferences will be held on Wednesdays to discuss your progress.  Your Inpatient Rehabilitation Care Coordinator will talk with you frequently to get your input and to update you on team discussions.  Team conferences with you and your family in attendance may also be held.  Expected length of stay: 7 Days  Overall anticipated outcome: Mod I to supervision   Depending on your progress and recovery, your program may change. Your Inpatient Rehabilitation Care Coordinator will coordinate services and will keep you informed of any changes. Your Inpatient Rehabilitation Care Coordinator's name and contact numbers are listed  below.  The following services may also be recommended but are not provided by the Inpatient Rehabilitation Center:   Home Health Rehabiltiation Services Outpatient Rehabilitation Services    Arrangements will be made to provide these services after discharge if needed.  Arrangements include referral to agencies that provide these services.  Your insurance has been verified to be:   [facility] Your primary doctor is:  Elenora Gamma, MD  Pertinent  information will be shared with your doctor and your insurance company.  Inpatient Rehabilitation Care Coordinator:  Lavera Guise, [place] [phone] or [phone]  Information discussed with and copy given to patient by: Andria Rhein, 11/29/2022, 9:47 AM

## 2022-11-29 NOTE — Progress Notes (Signed)
Physical Therapy Session Note  Patient Details  Name: Corey Chang MRN: [id number] Date of Birth: May 26, 1981  Today's Date: 11/29/2022 PT Individual Time: 4098-1191 PT Individual Time Calculation (min): 75 min   Short Term Goals: Week 1:  PT Short Term Goal 1 (Week 1): Pt will perform sit<>stands using LRAD with CGA PT Short Term Goal 2 (Week 1): Pt will perform bed<>chair transfers using LRAD with CGA PT Short Term Goal 3 (Week 1): Pt will ambulate at least 147ft using LRAD with CGA PT Short Term Goal 4 (Week 1): Pt will navigate at least 4 steps using HRs with CGA  Skilled Therapeutic Interventions/Progress Updates:    Patient received in recliner finishing lunch and agreeable to therapy session. Pt requesting bathroom at start of session. PT donned swedish knee brace on Lt knee for time management with pt seated. Supervision for Sit<>stand from recliner to RW, CGA for ambulation into bathroom. Pt managed 3/3 toileting at supervision level. Pt ambulated out of bathroom to sink, verbal cues needed to maintain safe management of RW on floor and not to lift it off the ground during gait. Pt maintained balance at sink, denied fatigue and gait continued out of room to main gym. Pt ambulated ~150' with RW and CGA. Cues to stop intermittently and adjust Lt hand placement on RW. Pt sat on edge of mat in gym for rest break and for functional LE strengthening.  Repeat sit<>stand on foam wedge (~30* angle) on decline and incline.  -1x10 on incline -2x10 on decline  Soccer ball kick completed for SLS balance activity with bil LE 15x each, pt limited single limb stance on Lt LE vs Rt due to quad weakness however no buckling noted.   Dynamic gait activity completed with no UE support and close CGA on Lt side. Pt performed weaving through 7 cones. pt performed  through with no scissoring and good Lt foot placement 75% of time. Cues needed to deter stepping Rt foot over cones when trailing but to  position body so both LE's could weave between cones. Pt completed 4 x through. Seated rest provided on mat table and pt then ambulated to 6" steps for Lt LE step ups. 2x25 reps completed on 6" step with bil hand rail for support.  - 1x25 with pt maintained Lt foot on stair while stepping Rt up  and back - 1x25 with pt starting with Lt foot on floor and stepping up with Lt, following with Rt, and then reverse order to return to floor Pt returned to mat table for rest break and reported some fatigue. Supervision to move from sitting >supine for isolated Lt LE strengthening.   Pt performed SLR on Lt LE with 6lbs weight - 1x 15 reps - pt limited taking breaks due to cramp in Rt hamstring 1x 15 reps for Lt hip abduction in sidelying with no weight and light assist to maintain proper hip position to activate gluteus medius. Pt sat up to EOM with supervision and short rest provided before gait training.  Ambulation with SBQC, 120', close CGA for balance. Pt with slightly increased Ltdrift with QC   Basketball toss with bil UE on stable surface.      Therapy Documentation Precautions:  Precautions Precautions: Fall, Other (comment) Precaution Comments: L knee instability Restrictions Weight Bearing Restrictions: No  Pain: Pain Assessment Pain Scale: 0-10 Pain Score: 0-No pain    Therapy/Group: Individual Therapy  Wynn Maudlin, DPT Acute Rehabilitation Services Office [phone]  11/29/22 4:41  PM  

## 2022-11-29 NOTE — Progress Notes (Signed)
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  He had external beam radiation in August 2001 with 54 Gy in 30 fractions on RTOG 9802 protocol.   He declined chemotherapy.    Seizure (HCC)    from history of oligodendroglioma   Past Surgical History:  Past Surgical History:  Procedure Laterality Date   ANKLE SURGERY     right, x 3   brain biopsy  1997, 2001   Social History:  reports that he quit smoking about 16 years ago. His smoking use included cigarettes. He smoked an average of .5 packs per day. His smokeless tobacco use includes snuff. He reports current alcohol use of about 2.0 standard drinks of alcohol per week. He reports that he does not use drugs.  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 Education: HS Health Literacy - How often do you need to have someone help you when you read instructions, pamphlets, or other written material from your doctor or pharmacy?: Never Writes: Yes Employment Status: Employed 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, drilling Return to Work Plans: TBD Marine scientist Issues: N/a Guardian/Conservator: N/A   Abuse/Neglect Abuse/Neglect Assessment  Can Be Completed: Yes Physical Abuse: Denies Verbal Abuse: Denies Sexual Abuse: Denies Exploitation of patient/patient's resources: Denies Self-Neglect: Denies  Patient response to: Social Isolation - How often do you feel lonely or isolated from those around you?: Never  Emotional Status Pt's affect, behavior and adjustment status: Pleasant Recent Psychosocial Issues: coping, Psychiatric History: N/a Substance Abuse History: N/A  Patient / Family Perceptions, Expectations & Goals Pt/Family understanding of illness & functional limitations: yes Premorbid pt/family roles/activities: independent and working Anticipated changes in roles/activities/participation: spouse able to assist pt at home Pt/family expectations/goals: MOD I/Supervision  [employer]: None Premorbid Home Care/DME Agencies: None Transportation available at discharge: spouse or friends Is the patient able to respond to transportation needs?: Yes In the past 12 months, has lack of transportation kept you from medical appointments or from getting medications?: No In the past 12 months, has lack of transportation kept you from meetings, work, or from getting things needed for daily living?: No Resource referrals recommended: Neuropsychology  Discharge Planning Living Arrangements: Spouse/significant other, Children Support Systems: Spouse/significant other, Children Type of Residence: Private residence (2 level home, 2 steps) Insurance Resources: Media planner (specify) Animator) Financial Resources: Employment Surveyor, quantity Screen Referred: No Living Expenses: Psychologist, sport and exercise Management: Spouse, Patient Does the patient have any problems obtaining your medications?: No Home Management: Independent Patient/Family Preliminary Plans: Plans to remain, also assistance from spouse Care Coordinator Barriers to Discharge: Insurance for SNF coverage, Lack of/limited family support, Decreased  caregiver support Care Coordinator Anticipated Follow Up Needs: HH/OP Expected length of stay: 7 Days  Clinical Impression Sw met with pt, introduced self and explained role. Pt plans to d/c home with spouse  and children to assist and supervise. Pt d/c to 2 level home with 2 steps to enter. Spouse able to provide 24/7 supervision. No additional questions or concerns.  Andria Rhein 11/29/2022, 1:30 PM

## 2022-11-30 DIAGNOSIS — I639 Cerebral infarction, unspecified: Secondary | ICD-10-CM | POA: Diagnosis not present

## 2022-11-30 LAB — ERYTHROPOIETIN: Erythropoietin: 5.9 m[IU]/mL (ref 2.6–18.5)

## 2022-11-30 NOTE — Progress Notes (Signed)
Speech Language Pathology Daily Session Note  Patient Details  Name: Konor Montantes MRN: [id number] Date of Birth: February 07, 1981  Today's Date: 11/30/2022 SLP Individual Time: 0800-0900 SLP Individual Time Calculation (min): 60 min  Short Term Goals: Week 1: SLP Short Term Goal 1 (Week 1): Pt will tolerate least restrictive diet with no overt s/sx of aspiration given minA verbal cues. SLP Short Term Goal 2 (Week 1): Pt will demonstrate alternating attention task (i.e. medication management) with minA verbal cues. SLP Short Term Goal 3 (Week 1): Pt will demonstrate divided attention during a simple task with minA verbal cues.  Skilled Therapeutic Interventions:  Pt was seen in am to address cognitive re- training. Pt initially encountered in bed however transferred to recliner with SLP assistance for completion of session. Pt reports improved swallowing function with decreased s/sx pen/ asp. SLP challenged pt in alternating and divided attention through functional tasks. SLP reviewed current medication list including frequency and rationale. Per pt, he takes ~3-4 medications at home and does not utilize a pill box. He recalled schedule for taking pills ~6:55am and 7pm. He presented with good comprehension of prescribed meds and rationale for taking them. Pt asked thoughtful questions regarding current med list and engaged in meaningful discussion regarding risk/ reward of medication compliance. Given external visual aid, pt completed AM and PM pill box with SLP challenging pt in participating in conversational exchange throughout task. Pt required min to sup A for alternating attention and sup a for divided attention. Pt completed task successfully with ability to express rationale for determining whe to take once a day meds. Pt was left seated upright in recliner with chair alarm active and call button within reach. SLP to continue POC.   Pain Pain Assessment Pain Scale: 0-10 Pain Score: 0-No  pain  Therapy/Group: Individual Therapy  Renaee Munda 11/30/2022, 12:32 PM

## 2022-11-30 NOTE — Progress Notes (Signed)
Occupational Therapy Session Note  Patient Details  Name: Corey Chang MRN: [id number] Date of Birth: 06/03/1981  Today's Date: 11/30/2022 OT Individual Time: 1300-1359 OT Individual Time Calculation (min): 59 min    Short Term Goals: Week 1:  OT Short Term Goal 1 (Week 1): STG=LTG d/t ELOS  Skilled Therapeutic Interventions/Progress Updates:     Pt received sitting up in recliner presenting to be in good spirits receptive to skilled OT session reporting 0/10 pain- OT offering intermittent rest breaks, repositioning, and therapeutic support to optimize participation in therapy session. Pt completed functional mobility to therapy gym without RW for endurance training CGA. Pt noted to step heavy on RLE and have intermittent scissoring gait during functional mobility, however able to correct with mod VB cueing when giving maximal effort.   Engaged Pt in seated peg activity to address LUE strength and VM coordination deficits. Instructed Pt to utilize resistive grip strengthener on second setting using silver spring to don medium sized pegs into peg board. Facilitated maintained resisted grasp by presenting pegs in superior/inferior L visual field with Pt transporting pegs to R side of table to place in peg board. Pt able to place 10 pegs x2 trials with rest break provided between trials. Pt then used resistive hand string strengthener to remove pegs and place into cylinder cones of matching color for increased VM challenge with Pt able to place 20/20 pegs without dropping.   Pt transitioned to 4-point quadruped position with CGA. Pt able to maintain position with CGA. Facilitated weight bearing through RUE while Pt utilized LUE to place and stack cones 12x2 trials on elevated bench. Pt then switched hands stacking cones on bench with LUE with increased visual motor coordination challenge.   Engaged pt in dynamic standing U/LB exercises to increase strength and endurance for BADL and IADL tasks.  Pt completed standing anterior shoulder raises and combination squat with overhead press exercises while holding 5# weighted bar with CGA and mod VB/tactile cues provided for technique. Pt intermittently releasing grasp with LUE, however able to correct and tighten grasp with LUE. Pt noted to overextend L knee when returning to stand from squat position- able to correct with min tactile and VB cueing.   Pt ambulated back to room CGA for functional mobility and endurance training. Pt was left resting in recliner with call bell in reach, seat belt alarm on, and all needs met.    Therapy Documentation Precautions:  Precautions Precautions: Fall, Other (comment) Precaution Comments: L knee instability Restrictions Weight Bearing Restrictions: No General:   Vital Signs: Therapy Vitals Temp: 97.6 F (36.4 C) Pulse Rate: 66 Resp: 18 BP: 114/82 Patient Position (if appropriate): Lying Oxygen Therapy SpO2: 99 % O2 Device: Room Air  Therapy/Group: Individual Therapy  Army Fossa 11/30/2022, 8:03 AM

## 2022-11-30 NOTE — Progress Notes (Signed)
PROGRESS NOTE   Subjective/Complaints:  Pt requesting grounds pass otherwise no c/os   ROS: No CP, SOB, N/V/D   Objective:   No results found. Recent Labs    11/29/22 0920  WBC 5.4  HGB 16.9  HCT 49.4  PLT 232    Recent Labs    11/28/22 0532 11/29/22 0920  NA 130* 132*  K 3.5 3.6  CL 100 97*  CO2 20* 24  GLUCOSE 92 83  BUN 14 13  CREATININE 1.03 1.09  CALCIUM 9.3 9.8     Intake/Output Summary (Last 24 hours) at 11/30/2022 0818 Last data filed at 11/30/2022 0700 Gross per 24 hour  Intake 1089 ml  Output 0 ml  Net 1089 ml         Physical Exam: Vital Signs Blood pressure 114/82, pulse 66, temperature 97.6 F (36.4 C), resp. rate 18, height 5\' 11"  (1.803 m), weight 86.4 kg, SpO2 99 %.   General: No acute distress Mood and affect are appropriate Heart: Regular rate and rhythm no rubs murmurs or extra sounds Lungs: Clear to auscultation, breathing unlabored, no rales or wheezes Abdomen: Positive bowel sounds, soft nontender to palpation, nondistended Extremities: No clubbing, cyanosis, or edema Skin: No evidence of breakdown, no evidence of rash   Skin: C/D/I. No apparent lesions. Neuro: very mild dysarthria MsK: no pain with left hip int/ext rotation   PRIOR EXAMS: MSK:      No apparent deformity.      Strength:                RUE: 5/5 SA, 5/5 EF, 5/5 EE, 5/5 WE, 5/5 FF, 5/5 FA                 LUE: 4-/5 throughout                RLE: 5/5 HF, 5/5 KE, 5/5 DF, 5/5 EHL, 5/5 PF                 LLE:  5-/5 throughout      Coordination:  Dysmetria with left FTN and decrease in motor control LLE.  Spasticity: MAS 0 in all extremities.  Assessment/Plan: 1. Functional deficits which require 3+ hours per day of interdisciplinary therapy in a comprehensive inpatient rehab setting. Physiatrist is providing close team supervision and 24 hour management of active medical problems listed  below. Physiatrist and rehab team continue to assess barriers to discharge/monitor patient progress toward functional and medical goals  Care Tool:  Bathing    Body parts bathed by patient: Left arm, Chest, Abdomen, Front perineal area, Right upper leg, Left upper leg, Face   Body parts bathed by helper: Right arm, Buttocks, Right lower leg, Left lower leg     Bathing assist Assist Level: Minimal Assistance - Patient > 75%     Upper Body Dressing/Undressing Upper body dressing   What is the patient wearing?: Pull over shirt    Upper body assist Assist Level: Minimal Assistance - Patient > 75%    Lower Body Dressing/Undressing Lower body dressing      What is the patient wearing?: Pants, Underwear/pull up     Lower body  assist Assist for lower body dressing: Moderate Assistance - Patient 50 - 74%     Toileting Toileting    Toileting assist Assist for toileting: Moderate Assistance - Patient 50 - 74%     Transfers Chair/bed transfer  Transfers assist     Chair/bed transfer assist level: Minimal Assistance - Patient > 75%     Locomotion Ambulation   Ambulation assist      Assist level: Minimal Assistance - Patient > 75% Assistive device: Hand held assist Max distance: 232ft   Walk 10 feet activity   Assist     Assist level: Minimal Assistance - Patient > 75% Assistive device: Hand held assist   Walk 50 feet activity   Assist    Assist level: Minimal Assistance - Patient > 75% Assistive device: Hand held assist    Walk 150 feet activity   Assist    Assist level: Minimal Assistance - Patient > 75% Assistive device: Hand held assist    Walk 10 feet on uneven surface  activity   Assist     Assist level: Minimal Assistance - Patient > 75% Assistive device: Hand held assist, Other (comment) (railing)   Wheelchair     Assist Is the patient using a wheelchair?: No             Wheelchair 50 feet with 2 turns  activity    Assist            Wheelchair 150 feet activity     Assist          Blood pressure 114/82, pulse 66, temperature 97.6 F (36.4 C), resp. rate 18, height 5\' 11"  (1.803 m), weight 86.4 kg, SpO2 99 %.  Medical Problem List and Plan: 1. 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  Antithrombotics: -DVT/anticoagulation:  Pharmaceutical: Lovenox 40mg  QD             -antiplatelet therapy: ASA 81 mg, Plavix 75 mg (x21 days) 3. Pain Management: Tylenol prn.  Left groin pain- hx AVN  4. Mood/Behavior/Sleep: LCSW to follow for evaluation and support.  --insomnia due to noises in the hospital/bored and sleeping during the day. Trazodone prn ordered.              -antipsychotic agents: N/A 5. Neuropsych/cognition: This patient is capable of making decisions on his own behalf. 6. Skin/Wound Care: Routine pressure relief measures.  7. Fluids/Electrolytes/Nutrition: Monitor I/O. Monitor routine labs 8. Hyponatremia: Question baseline.  -11/27/22 Na 130 this morning, down slightly from 133, monitor for now and recheck BMP in AM -11/28/22 Na stable at 130, this may be his baseline, monitor on routine labs for now    Latest Ref Rng & Units 11/29/2022    9:20 AM 11/28/2022    5:32 AM 11/27/2022    5:20 AM  BMP  Glucose 70 - 99 mg/dL 83  92  97   BUN 6 - 20 mg/dL 13  14  14    Creatinine 0.61 - 1.24 mg/dL 7.82  9.56  2.13   Sodium 135 - 145 mmol/L 132  130  130   Potassium 3.5 - 5.1 mmol/L 3.6  3.5  3.7   Chloride 98 - 111 mmol/L  97  100  101   CO2 22 - 32 mmol/L 24  20  18    Calcium 8.9 - 10.3 mg/dL 9.8  9.3  9.6     9. Hyperlipidemia: LDL-192, Trig-254, HDL-35             --now on high dose statin, Atorvastatin 80mg  QD.  10. H/o Seizures: Started 1998 and last 02/2022 per records.Likely associated with hx of  oligodendrogioma  Followed by Dr. Karel Jarvis.             --on Lamictal  XR 200 mg/pm and Keppra 1500 mg BID 11. Constipation: Has resolved and does not want any laxative scheduled. PRNs available 12. Seasonal allergies: continue claritin 10mg  QD 13. Polycythemia:  -11/27/22 elevated homocysteine 24.7, Hgb stable 18 range, defer to primary team,     Latest Ref Rng & Units 11/29/2022    9:20 AM 11/27/2022    5:20 AM 11/26/2022    4:33 AM  CBC  WBC 4.0 - 10.5 K/uL 5.4  6.6    Hemoglobin 13.0 - 17.0 g/dL 16.1  09.6  04.5   Hematocrit 39.0 - 52.0 % 49.4  51.7    Platelets 150 - 400 K/uL 232  218      14. Diarrhea, started 11/26/22 -11/27/22 several liquidy stools overnight, imodium 2mg  given x2 and not much help; no obvious source but may have received miralax prior to arrival; no other s/sx, doubt acute infection or need for emergent work up for now; requesting pepto and gas X; ordered Pepto bismol 30ml q4h PRN, simethicone 80mg  q6h PRN, and fibercon 625mg  QD.  resolved    LOS: 4 days A FACE TO FACE EVALUATION WAS PERFORMED  Erick Colace 11/30/2022, 8:18 AM

## 2022-11-30 NOTE — Progress Notes (Signed)
Physical Therapy Session Note  Patient Details  Name: Corey Chang MRN: [id number] Date of Birth: 10/01/1980  Today's Date: 11/30/2022 PT Individual Time: 4098-1191 PT Individual Time Calculation (min): 36 min   Short Term Goals: Week 1:  PT Short Term Goal 1 (Week 1): Pt will perform sit<>stands using LRAD with CGA PT Short Term Goal 2 (Week 1): Pt will perform bed<>chair transfers using LRAD with CGA PT Short Term Goal 3 (Week 1): Pt will ambulate at least 180ft using LRAD with CGA PT Short Term Goal 4 (Week 1): Pt will navigate at least 4 steps using HRs with CGA  Skilled Therapeutic Interventions/Progress Updates:  Patient seated in recliner on entrance to room. Patient alert and agreeable to PT session. Has just completed OT session and is fatigued.  Patient with no pain complaint at start of session.  Gait Training/ NMR:  Swedish Knee Cage donned prior to ambulation. Pt is able to relate how to don but requires MinA to complete d/t inability to use L hand. Ambulates to day room with no AD and light CGA.  Pt guided in and ambulated 822 ft using no AD with close supervision/ CGA. Demonstrated intermittent missteps into wider BOS but maintain balance throughout.  Next, pt guided in functional gait assessment. Pt demos minimal difficulty with vertical head turns, more NBOS ambulation, and turning.   NMR performed for improvements in motor control and coordination, balance, sequencing, judgement, and self confidence/ efficacy in performing all aspects of mobility at highest level of independence.   At end of session, pt ambulates without SKC to bathroom from recliner with close supervision/ CGA and no AD. Maintain focus to L knee and prevent hyperextension in GR during stance phase.   Patient seated upright in recliner at end of session with brakes locked, belt alarm set, and all needs within reach.   Therapy Documentation Precautions:  Precautions Precautions: Fall,  Other (comment) Precaution Comments: L knee instability Restrictions Weight Bearing Restrictions: No General:   Vital Signs: Therapy Vitals Temp: 97.9 F (36.6 C) Temp Source: Oral Pulse Rate: 73 Resp: 17 BP: 119/84 Patient Position (if appropriate): Sitting Oxygen Therapy SpO2: 99 % O2 Device: Room Air Pain:  No pain indicated this session.   Balance: Standardized Balance Assessment Standardized Balance Assessment: Functional Gait Assessment Functional Gait  Assessment Gait assessed :  ( = 822') Gait Level Surface: Walks 20 ft in less than 7 sec but greater than 5.5 sec, uses assistive device, slower speed, mild gait deviations, or deviates 6-10 in outside of the 12 in walkway width. (requires Swedish Knee Cage to RLE) Change in Gait Speed: Able to smoothly change walking speed without loss of balance or gait deviation. Deviate no more than 6 in outside of the 12 in walkway width. Gait with Horizontal Head Turns: Performs head turns smoothly with slight change in gait velocity (eg, minor disruption to smooth gait path), deviates 6-10 in outside 12 in walkway width, or uses an assistive device. Gait with Vertical Head Turns: Performs task with moderate change in gait velocity, slows down, deviates 10-15 in outside 12 in walkway width but recovers, can continue to walk. Gait and Pivot Turn: Turns slowly, requires verbal cueing, or requires several small steps to catch balance following turn and stop Step Over Obstacle: Is able to step over one shoe box (4.5 in total height) without changing gait speed. No evidence of imbalance. Gait with Narrow Base of Support: Ambulates 7-9 steps. Gait with Eyes Closed: Walks  20 ft, uses assistive device, slower speed, mild gait deviations, deviates 6-10 in outside 12 in walkway width. Ambulates 20 ft in less than 9 sec but greater than 7 sec. Ambulating Backwards: Walks 20 ft, uses assistive device, slower speed, mild gait deviations, deviates  6-10 in outside 12 in walkway width. Steps: Alternating feet, must use rail. Total Score: 19 FGA comment:: Score indicates Medium Fall Risk Exercises:   Other Treatments:      Therapy/Group: Individual Therapy  Loel Dubonnet PT, DPT, CSRS 11/30/2022, 4:43 PM

## 2022-11-30 NOTE — Progress Notes (Signed)
Physical Therapy Session Note  Patient Details  Name: Corey Chang MRN: [id number] Date of Birth: 02/22/1981  Today's Date: 11/30/2022 PT Individual Time: 1007-1107 PT Individual Time Calculation (min): 60 min   Short Term Goals: Week 1:  PT Short Term Goal 1 (Week 1): Pt will perform sit<>stands using LRAD with CGA PT Short Term Goal 2 (Week 1): Pt will perform bed<>chair transfers using LRAD with CGA PT Short Term Goal 3 (Week 1): Pt will ambulate at least 172ft using LRAD with CGA PT Short Term Goal 4 (Week 1): Pt will navigate at least 4 steps using HRs with CGA  Skilled Therapeutic Interventions/Progress Updates:    Pt received sitting in recliner and agreeable to therapy session. Sit<>stands, no AD, with SBA for safety. Donned L LE Swedish knee cage to control L knee hyperextension during stance phase of gait. Gait training ~314ft, no AD, with CGA and occasional light min assist for steadying if pt catches L toes, starts to occur towards end of gait cycle with fatigue. Doffed knee cage. Gait training ~237ft, no AD, with CGA and occasional light min assist for steadying; however, without the knee cage pt has L knee snapping into hyperextension during terminal stance phase of gait ~50% of the time, but control is improving - pt reports he is not able to feel L knee positioning well enough to know when it is happening  L LE hamstring strengthening and NMR standing in // bars with B UE support and mirror feedback during the following:  - x20reps hamstring curls - x20reps hamstring curls with 6lb ankle weight - 2x10reps hamstring curls with 10lb ankle weight - cuing for eccentric control   Sit<>supine on mat mod-I.   Supine on mat, performed the following for L LE posterior chain strengthening and NMR:  - bridging 2x10 reps with cuing for slow, controlled movement and keeping L LE from excessively externally rotating/abducting - placed feet on 4" step and performed x10 reps bridging    Dynamic gait training through agility course including: 4x 6" hurddles, 1x 4" step, and agility ladder without UE support including:  - wearing 6lb ankle weight on L LE forward and lateral side stepping to L then donned Swedish knee cage due to pt having increased consistency of L knee hyperextension; and then finished R lateral side stepping through the course - doffed ankle weight then repeated forward and side stepping both directions Requires CGA forward but more consistent min assist with sideways  - progressed to backwards stepping with consistent heavy min assist due to posterior lean  Gait training back to room, no UE support but using Swedish knee cage, with CGA - achieves reciprocal stepping pattern and consistent L foot clearance at this time - gait speed adequate but decreased compared to pt's PLOF. Pt left seated in recliner with needs in reach and seat belt alarm on.   Therapy Documentation Precautions:  Precautions Precautions: Fall, Other (comment) Precaution Comments: L knee instability Restrictions Weight Bearing Restrictions: No   Pain:  Pt reports his only discomfort is in his back and neck from "sitting all day." Addressed by having pt participate in mobility tasks.    Therapy/Group: Individual Therapy  Ginny Forth , PT, DPT, NCS, CSRS 11/30/2022, 7:58 AM

## 2022-12-01 DIAGNOSIS — I639 Cerebral infarction, unspecified: Secondary | ICD-10-CM | POA: Diagnosis not present

## 2022-12-01 NOTE — Progress Notes (Signed)
Patient ID: Corey Chang, male   DOB: September 25, 1980, 42 y.o.   MRN: [id number]  Team Conference Report to Patient/Family  Team Conference discussion was reviewed with the patient and caregiver, including goals, any changes in plan of care and target discharge date.  Patient and caregiver express understanding and are in agreement.  The patient has a target discharge date of 12/04/22.  SW met with pt and spouse in the room and provided team conference updates and addressed questions and concerns. Spouse completing family education today. Pt making great progress and anticipating supervision goals. Pt and spouse aware of d/c date. Sw will inform pt and spouse of recommendations closer to d/c. No additional questions or concerns.   Andria Rhein 12/01/2022, 2:24 PM

## 2022-12-01 NOTE — Progress Notes (Signed)
PROGRESS NOTE   Subjective/Complaints:  Pt requesting grounds pass otherwise no c/os   ROS: No CP, SOB, N/V/D   Objective:   No results found. Recent Labs    11/29/22 0920  WBC 5.4  HGB 16.9  HCT 49.4  PLT 232    Recent Labs    11/29/22 0920  NA 132*  K 3.6  CL 97*  CO2 24  GLUCOSE 83  BUN 13  CREATININE 1.09  CALCIUM 9.8     Intake/Output Summary (Last 24 hours) at 12/01/2022 0815 Last data filed at 12/01/2022 0741 Gross per 24 hour  Intake 834 ml  Output --  Net 834 ml         Physical Exam: Vital Signs Blood pressure 128/75, pulse 68, temperature 97.9 F (36.6 C), temperature source Oral, resp. rate 18, height 5\' 11"  (1.803 m), weight 86.4 kg, SpO2 99 %.   General: No acute distress Mood and affect are appropriate Heart: Regular rate and rhythm no rubs murmurs or extra sounds Lungs: Clear to auscultation, breathing unlabored, no rales or wheezes Abdomen: Positive bowel sounds, soft nontender to palpation, nondistended Extremities: No clubbing, cyanosis, or edema Skin: No evidence of breakdown, no evidence of rash   Skin: C/D/I. No apparent lesions. Neuro: very mild dysarthria MsK: no pain with left hip int/ext rotation   PRIOR EXAMS: MSK:      No apparent deformity.      Strength:                RUE: 5/5 SA, 5/5 EF, 5/5 EE, 5/5 WE, 5/5 FF, 5/5 FA                 LUE: 4-/5 throughout                RLE: 5/5 HF, 5/5 KE, 5/5 DF, 5/5 EHL, 5/5 PF                 LLE:  5-/5 throughout      Coordination:  Dysmetria with left FTN and decrease in motor control LLE.  Spasticity: MAS 0 in all extremities.  Assessment/Plan: 1. Functional deficits which require 3+ hours per day of interdisciplinary therapy in a comprehensive inpatient rehab setting. Physiatrist is providing close team supervision and 24 hour management of active medical problems listed below. Physiatrist and rehab team  continue to assess barriers to discharge/monitor patient progress toward functional and medical goals  Care Tool:  Bathing    Body parts bathed by patient: Left arm, Chest, Abdomen, Front perineal area, Right upper leg, Left upper leg, Face   Body parts bathed by helper: Right arm, Buttocks, Right lower leg, Left lower leg     Bathing assist Assist Level: Minimal Assistance - Patient > 75%     Upper Body Dressing/Undressing Upper body dressing   What is the patient wearing?: Pull over shirt    Upper body assist Assist Level: Minimal Assistance - Patient > 75%    Lower Body Dressing/Undressing Lower body dressing      What is the patient wearing?: Pants, Underwear/pull up     Lower body assist Assist for lower body dressing: Moderate Assistance -  Patient 50 - 74%     Toileting Toileting    Toileting assist Assist for toileting: Moderate Assistance - Patient 50 - 74%     Transfers Chair/bed transfer  Transfers assist     Chair/bed transfer assist level: Minimal Assistance - Patient > 75%     Locomotion Ambulation   Ambulation assist      Assist level: Minimal Assistance - Patient > 75% Assistive device: Hand held assist Max distance: 275ft   Walk 10 feet activity   Assist     Assist level: Minimal Assistance - Patient > 75% Assistive device: Hand held assist   Walk 50 feet activity   Assist    Assist level: Minimal Assistance - Patient > 75% Assistive device: Hand held assist    Walk 150 feet activity   Assist    Assist level: Minimal Assistance - Patient > 75% Assistive device: Hand held assist    Walk 10 feet on uneven surface  activity   Assist     Assist level: Minimal Assistance - Patient > 75% Assistive device: Hand held assist, Other (comment) (railing)   Wheelchair     Assist Is the patient using a wheelchair?: No             Wheelchair 50 feet with 2 turns activity    Assist             Wheelchair 150 feet activity     Assist          Blood pressure 128/75, pulse 68, temperature 97.9 F (36.6 C), temperature source Oral, resp. rate 18, height 5\' 11"  (1.803 m), weight 86.4 kg, SpO2 99 %.  Medical Problem List and Plan: 1. 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  Antithrombotics: -DVT/anticoagulation:  Pharmaceutical: Lovenox 40mg  QD             -antiplatelet therapy: ASA 81 mg, Plavix 75 mg (x21 days) 3. Pain Management: Tylenol prn.  Left groin pain- hx AVN  4. Mood/Behavior/Sleep: LCSW to follow for evaluation and support.  --insomnia due to noises in the hospital/bored and sleeping during the day. Trazodone prn ordered.              -antipsychotic agents: N/A 5. Neuropsych/cognition: This patient is capable of making decisions on his own behalf. 6. Skin/Wound Care: Routine pressure relief measures.  7. Fluids/Electrolytes/Nutrition: Monitor I/O. Monitor routine labs 8. Hyponatremia: Question baseline.  Na+ improved to 132 no specific intervention needed     Latest Ref Rng & Units 11/29/2022    9:20 AM 11/28/2022    5:32 AM 11/27/2022    5:20 AM  BMP  Glucose 70 - 99 mg/dL 83  92  97   BUN 6 - 20 mg/dL 13  14  14    Creatinine 0.61 - 1.24 mg/dL 2.84  1.32  4.40   Sodium 135 - 145 mmol/L 132  130  130   Potassium 3.5 - 5.1 mmol/L 3.6  3.5  3.7   Chloride 98 - 111 mmol/L 97  100  101   CO2 22 - 32 mmol/L 24  20  18    Calcium 8.9 - 10.3 mg/dL 9.8  9.3  9.6  9. Hyperlipidemia: LDL-192, Trig-254, HDL-35             --now on high dose statin, Atorvastatin 80mg  QD.  10. 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 Karel Jarvis.             --on Lamictal  XR 200 mg/pm and Keppra 1500 mg BID 11. Constipation: Has  resolved and does not want any laxative scheduled. PRNs available 12. Seasonal allergies: continue claritin 10mg  QD 13. Polycythemia:  Hgb stable 18 range, defer to primary team,     Latest Ref Rng & Units 11/29/2022    9:20 AM 11/27/2022    5:20 AM 11/26/2022    4:33 AM  CBC  WBC 4.0 - 10.5 K/uL 5.4  6.6    Hemoglobin 13.0 - 17.0 g/dL 30.8  65.7  84.6   Hematocrit 39.0 - 52.0 % 49.4  51.7    Platelets 150 - 400 K/uL 232  218      14. Diarrhea, started 11/26/22   resolved   15.  Elevated homocysteine level may be CVA risk factor , will check serum B12 and supplement as needed  LOS: 5 days A FACE TO FACE EVALUATION WAS PERFORMED  Corey Chang 12/01/2022, 8:15 AM

## 2022-12-01 NOTE — Progress Notes (Signed)
Speech Language Pathology Daily Session Note  Patient Details  Name: Corey Chang MRN: [id number] Date of Birth: May 16, 1981  Today's Date: 12/01/2022 SLP Individual Time: 4098-1191 SLP Individual Time Calculation (min): 25 min  Short Term Goals: Week 1: SLP Short Term Goal 1 (Week 1): Pt will tolerate least restrictive diet with no overt s/sx of aspiration given minA verbal cues. SLP Short Term Goal 2 (Week 1): Pt will demonstrate alternating attention task (i.e. medication management) with minA verbal cues. SLP Short Term Goal 3 (Week 1): Pt will demonstrate divided attention during a simple task with minA verbal cues.  Skilled Therapeutic Interventions:  Pt was seen in PM to address cognitive re- training, dysphagia management, and speech intelligibility. Pt was alert and seated upright in recliner with wife present. SLP reviewed purpose of ST and goals addressing attention, swallowing, and speech intelligibility. Per pt's wife he is functioning at baseline from a cognitive stand point and swallowing. Pt consumed sips of thin liquid via straw during session with no s/sx pen/asp. SLP reviewed dysarthria impacting speech intelligibility and recommended strategies. She reports pt's speech is somewhat different from his norm but reports she can understand him even over the phone. SLP reviewed strategies of over articulation and separating words. SLP also reviewed safe swallowing precautions including upright positioning, oral hygiene BID, slow rate, and small sips. SLP also recommended removing distractions when completing higher level tasks and re- checking tasks for errors. Pt and his wife verbalized understanding. Pt was left seated upright in recliner with chair alarm active and call button within reach. SLP plan to continue POC and prepare for upcoming discharge.   Pain Pain Assessment Pain Scale: 0-10  Therapy/Group: Individual Therapy  Renaee Munda 12/01/2022, 3:26 PM

## 2022-12-01 NOTE — Progress Notes (Signed)
Occupational Therapy Session Note  Patient Details  Name: Corey Chang MRN: [id number] Date of Birth: 1981-03-11  Today's Date: 12/01/2022 OT Individual Time: 1100-1200 OT Individual Time Calculation (min): 60 min   Session 2: OT Individual Time: 4098-1191 OT Individual Time Calculation (min): 40 min   Short Term Goals: Week 1:  OT Short Term Goal 1 (Week 1): STG=LTG d/t ELOS  Skilled Therapeutic Interventions/Progress Updates:     AM Session:  Pt received sitting up in recliner presenting to be in good spirits receptive to skilled OT session reporting 0/10 pain- OT offering intermittent rest breaks, repositioning, and therapeutic support to optimize participation in therapy session. Pt requesting to take shower this AM. Pt ambulated within his room to gather his clothing with close supervision without LOB noted no AD. Pt with improved safety awareness this session leaning against the wall when retrieving items from duffle bag to prevent LOB. Pt ambulated to bathroom without AD and stood at toilet to void close supervision. Pt transferred to TTB in walk-in shower with supervision and doffed clothing while seated on bench. Pt able to utilize LUE during shower at non-dominant level this session without dropping items. Pt completed U/LB dressing tasks seated EOB donning socks, shoes, pants, and shirt with supervision. Pt stood at sink to complete grooming/hygiene tasks without AD for increased balance challenge. Pt able to manipulate small items- open/close and squeeze tooth paste without dropping and maintain balance with supervision. Pt able to donn LLE knee cage with supervision seated EOB. Pt ambulated to ADL apartment without AD close supervision. Pt with single LOB, however able to correct without physical assistance. Educated Pt on impact of CVA on competing IADL tasks d/t balance deficits and engaged pt in simulated IADL- cooking, cleaning, and laundry- tasks to provided increased  awareness to deficits and impact on IADL function. Provided pt with education on techniques to increase safety such as leaning against wall when mopping, using stable counter tops to balance, and having a chair close by to take test breaks with pt able to implement strategies during IADL task with min VB cueing. Pt able to removed dishes.pans form high/low cabinets, load/unload dishwasher, maintain dynamic standing balance at sink/stove top, fold towels in standing, and mop floors with close supervision for safety. Min LOB x2 when quickly changing directions- Pt able to self correct. Pt ambulated back to room at end of session without AD supervision. Pt was left resting in recliner with call bell in reach, eat belt alarm on, and all needs met.    PM Session:  Pt received sitting up in recliner with Chang present in room for family education focused session. Pt in good spirits and receptive to skilled OT session. Educated Pt and Corey Chang on fall prevention, energy conservation, CVA recovery and impact on function, and simple home modifications to increase Corey safety. Engaged Pt and Corey family in discussion of these topics with both able to verbalize application of strategies to implement upon d/c to increase Pt safety demonstrating teach back as evidence of learning. Educated Pt and Corey family on Corey ability to complete morning BADL routine- completing dressing tasks while sitting down and using hemi-dressing techniques to increase independence. Discussed recommendation for having intermittent supervision at home and follow up OPOT service's with Corey Chang reporting she is available to drive him to appointments and provide 24/7 supervision upon d/c. Pt ambulated to ADL apartment and completed light household cleaning and simple meal prep with close supervision no AD. Educated on  importance of activity pacing, taking breaks, prioritizing tasks, and having a seat close by for rest breaks when completing IADLs  with both Pt and Corey Chang receptive. Pt completed bed mobility, shower transfer, and toilet transfers this session with supervision. Pt ambulated  back to room with Chang providing close supervision. Pt handed off to PT at end of session with all needs met.   Therapy Documentation Precautions:  Precautions Precautions: Fall, Other (comment) Precaution Comments: L knee instability Restrictions Weight Bearing Restrictions: No  Therapy/Group: Individual Therapy  Corey Chang 12/01/2022, 11:16 AM

## 2022-12-01 NOTE — Progress Notes (Signed)
Orthopedic Tech Progress Note Patient Details:  Corey Chang February 10, 1981 [id number] Swedish knee cage has been ordered from [facility]  Patient ID: Corey Chang, male   DOB: 02-Jan-1981, 42 y.o.   MRN: [id number]  [name] 12/01/2022, 11:37 AM

## 2022-12-01 NOTE — Progress Notes (Signed)
Physical Therapy Session Note  Patient Details  Name: Corey Chang MRN: [id number] Date of Birth: 09-Nov-1980  Today's Date: 12/01/2022 PT Individual Time: 1345-1431 and 4098-1191 PT Individual Time Calculation (min): 46 min and 31 min  Short Term Goals: Week 1:  PT Short Term Goal 1 (Week 1): Pt will perform sit<>stands using LRAD with CGA PT Short Term Goal 2 (Week 1): Pt will perform bed<>chair transfers using LRAD with CGA PT Short Term Goal 3 (Week 1): Pt will ambulate at least 173ft using LRAD with CGA PT Short Term Goal 4 (Week 1): Pt will navigate at least 4 steps using HRs with CGA  Skilled Therapeutic Interventions/Progress Updates:    Session 1: Pt received sitting EOB with his wife, Aundra Millet, present finishing with OT session and agreeable to continue with this therapy session for continued family education with hands-on training.  Pt had received his personal Swedish knee cage - pt demos ability to correctly don/doff it without assistance 2x during session. Therapist adjusted his brace to provide proper amount of support behind L knee to block hyperextension - pt reports he likes the brace.  Therapist educated pt/wife on the following:  - use of gait belt  - guarding on pt's L side - signs/symptoms of CVA: BE FAST - fall recovery and calling 911 - recommendation for follow-up OPPT  - pt's CLOF with recommendation for 24hr support upon D/C  [name] providing proper CGA/SBA throughout session with intermittent progressed to no cuing.   Sit<>stands, no AD, with supervision. Gait >266ft throughout CIR to various gyms, no AD, with CGA/close supervision provided by his wife. Educated her that pt will occasionally catch L toes on ground if fatigue, distracted, or on unlevel surfaces and to monitor/guard for that. This occurs 1x during session with pt able to utilize stepping balance recovery strategy to recover without increased assistance.  Educated on stair navigation with pt  ascending/descending 8 steps (6" height) using L HR progressed to no HR to simulate home entry with CGA for safety and then repeated with his wife providing CGA demonstrating proper understanding of guarding technique as demonstrated by therapist. Pt performs with reciprocal pattern on ascent and varying reciprocal vs step-to on descent.  Progressed to stair navigation in stairwell ascending/descending 11 steps using L HR to simulate going to 2nd floor of their home with therapist providing education on proper guarding technique and pt's wife demonstrating understanding - educated pt on performing step-to pattern leading with L LE on descent in full flight of stairs to ensure his safety.  Simulated ambulatory car transfer (van height) with pt's wife providing proper close supervision and therapist educating on sequencing safe technique.  Gait up/down ramp, over mulch, and up/down curb without UE support with pt's wife providing CGA for safety and no significant balance instability noted.  Therapist provided demonstration of proper floor transfer technique (along with fall recovery education above) and pt performed with close guarding for safety with therapist reinforcing education on turning to sit rather than standing up and turning, to improve his safety.   Pt/wife report feeling confident with pt's CLOF and with education/training provided today. Pt/wife report no questions/concerns.  At end of session, pt left seated on EOB in the care of his wife with safety plan updated.   Session 2: Pt received asleep, supine in bed but easily awakens and is agreeable to therapy session. Supine>sitting L EOB, MOD-I. Pt wearing L LE Swedish knee cage - therapist reinforces education on proper placement  of Swedish knee cage behind his knee to assist with stopping hyperextension. Sit<>stands, no AD, with close supervision for safety during session.  Gait training ~174ft x2 to/from main therapy gym, no AD, with  close supervision with 2x light min assist for balance on way to gym and no hands-on assist back to room - pt with 1x minor posterior LOB when stepping back to open door on way to gym.  Pt participated in 1 set of each of the following exercises with therapist providing cuing/education on proper form/technique as well as safe set-up of exercises at home with his wife guarding him for safety. Provided HEP printout and wrote instructions on ensuring his wife safely guards him during.  Access Code: [email or web address] URL: [email or web address] Date: 12/01/2022 Prepared by: Casimiro Needle  Exercises - Supine Bridge  - 1 x daily - 7 x weekly - 2 sets - 20 reps - Standing Hamstring Curl with Resistance  - 1 x daily - 7 x weekly - 2 sets - 20 reps - Side Step Overs with Cones and Counter Support  - 1 x daily - 7 x weekly - 2 sets - 10 reps - Backward Walking with Counter Support  - 1 x daily - 7 x weekly - 2 sets - 10 reps - Walking  - 1 x daily - 7 x weekly - 2 sets - 6-70minutes hold - Mini Squat with Counter Support  - 1 x daily - 7 x weekly - 2 sets - 10 reps   During backwards gait, cuing to lift L LE and perform knee flexion with hip extension rather than dragging L LE back without foot clearance. Provided pt with level 3 green theraband resistance for hamstring curls. Pt confirms he has cones to use for side stepping.  At end of session, pt left seated in recliner with needs in reach and seat belt alarm on.  Therapy Documentation Precautions:  Precautions Precautions: Fall, Other (comment) Precaution Comments: L knee instability Restrictions Weight Bearing Restrictions: No   Pain: Session 1: Denies pain during session.  Session 2: Denies pain during session.    Therapy/Group: Individual Therapy  Ginny Forth , PT, DPT, NCS, CSRS 12/01/2022, 1:32 PM

## 2022-12-02 ENCOUNTER — Other Ambulatory Visit (HOSPITAL_COMMUNITY): Payer: Self-pay

## 2022-12-02 LAB — VITAMIN B12: Vitamin B-12: 323 pg/mL (ref 180–914)

## 2022-12-02 MED ORDER — CLOPIDOGREL BISULFATE 75 MG PO TABS
75.0000 mg | ORAL_TABLET | Freq: Every day | ORAL | 0 refills | Status: AC
  Filled 2022-12-02: qty 10, 10d supply, fill #0

## 2022-12-02 MED ORDER — ATORVASTATIN CALCIUM 80 MG PO TABS
80.0000 mg | ORAL_TABLET | Freq: Every day | ORAL | 0 refills | Status: DC
  Filled 2022-12-02: qty 30, 30d supply, fill #0

## 2022-12-02 MED ORDER — CALCIUM POLYCARBOPHIL 625 MG PO TABS
625.0000 mg | ORAL_TABLET | Freq: Every day | ORAL | 0 refills | Status: DC
  Filled 2022-12-02: qty 30, 30d supply, fill #0

## 2022-12-02 MED ORDER — POLYETHYLENE GLYCOL 3350 17 G PO PACK: 17.0000 g | PACK | Freq: Every day | ORAL | 0 refills | Status: DC | PRN

## 2022-12-02 MED ORDER — ASPIRIN 81 MG PO TBEC
81.0000 mg | DELAYED_RELEASE_TABLET | Freq: Every day | ORAL | 0 refills | Status: AC
  Filled 2022-12-02: qty 240, 240d supply, fill #0

## 2022-12-02 NOTE — Progress Notes (Signed)
Physical Therapy Session Note  Patient Details  Name: Corey Chang MRN: [id number] Date of Birth: 05/18/1981  Today's Date: 12/02/2022 PT Individual Time: 4098-1191 PT Individual Time Calculation (min): 76 min   Short Term Goals: Week 1:  PT Short Term Goal 1 (Week 1): Pt will perform sit<>stands using LRAD with CGA PT Short Term Goal 2 (Week 1): Pt will perform bed<>chair transfers using LRAD with CGA PT Short Term Goal 3 (Week 1): Pt will ambulate at least 110ft using LRAD with CGA PT Short Term Goal 4 (Week 1): Pt will navigate at least 4 steps using HRs with CGA  Skilled Therapeutic Interventions/Progress Updates:    Pt received supine in bed, resting but easily awakens and agreeable to therapy session. Based on team discussion, pt has been made mod-I/independent in room without an AD and pt reports no concerns and no moments of instability while mobilizing by himself. Performs bed mobility independently. Pt dons L LE Swedish knee cage without cuing and without assist prior to ambulation. Sit<>stands, no AD, independently. Gait training >284ft on CIR with distant supervision progressing to independent - reciprocal stepping pattern, improving awareness of ensuring L knee cage is properly placed to prevent hyperextension, improving gait speed, continues to have decreased arm swing bilaterally (L>R).  Notified nurse, pt going outside. Gait training outside ~0.70mi x2 for 7-61minutes each, no AD, on unlevel surfaces including up/down ramps, on/off curb step and navigating stairs all with close supervision, except CGA progressed to close supervision for stair navigation using R UE support on HR, pt self selecting reciprocal stepping pattern with good control on L LE. Pt catches L toes x2 during gait training and is able to recover balance without physical assistance.   Gait back to CIR floor.  Performed L UE NMR task using nuts and bolts targeting LUE fine motor and gross motor coordination  exercises with pt dropping larger/heavier bolts, but is successful after a few attempts. Pt has to use R UE to initiate getting the nut on the bolt, but then is able to use L UE to tighten the nut with increased time - requires rest breaks due to fine motor control, muscle fatigue.  Performed L UE NMR task in sitting using mini-PEG board pegs as pt reports this task was challenging for him during OT session and pt has greatest challenge with L UE fine motor tasks - completed entire moderate level complexity PEG board pattern with increased time due to incoordination and pt occasionally dropping the PEGs and requiring increase time to manipulate them successfully in his hand to place on the board or remove from the board.  Gait back to his room independently. Therapist educated pt on local stroke support group, but pt not interested at this time. Pt left seated EOB with needs in reach.  Therapy Documentation Precautions:  Precautions Precautions: Fall, Other (comment) Precaution Comments: L knee instability Restrictions Weight Bearing Restrictions: No   Pain:  No reports of pain throughout session.    Therapy/Group: Individual Therapy  Ginny Forth , PT, DPT, NCS, CSRS 12/02/2022, 1:33 PM

## 2022-12-02 NOTE — Discharge Summary (Signed)
Physician Discharge Summary  Patient ID: Corey Chang MRN: [id number] DOB/AGE: 1981/04/08 42 y.o.  Admit date: 11/26/2022 Discharge date: 12/03/2022  Discharge Diagnoses:  Principal Problem:   Acute stroke of medulla oblongata (HCC) Active Problems:   Seizure disorder (HCC)   GERD (gastroesophageal reflux disease)   Allergic rhinitis Hyponatremia Polycythemia   Discharged Condition: stable  Significant Diagnostic Studies: N/A  Labs:  Basic Metabolic Panel: Recent Labs  Lab 11/27/22 0520 11/28/22 0532 11/29/22 0920  NA 130* 130* 132*  K 3.7 3.5 3.6  CL 101 100 97*  CO2 18* 20* 24  GLUCOSE 97 92 83  BUN 14 14 13   CREATININE 1.08 1.03 1.09  CALCIUM 9.6 9.3 9.8    CBC:    Latest Ref Rng & Units 11/29/2022    9:20 AM 11/27/2022    5:20 AM 11/26/2022    4:33 AM  CBC  WBC 4.0 - 10.5 K/uL 5.4  6.6    Hemoglobin 13.0 - 17.0 g/dL 78.2  95.6  21.3   Hematocrit 39.0 - 52.0 % 49.4  51.7    Platelets 150 - 400 K/uL 232  218       CBG: No results for input(s): "GLUCAP" in the last 168 hours.  Brief HPI:   Corey Chang is a 42 y.o. right-handed male with history of oligodendroglioma s/p XRT, complex partial seizures, AVN left hip who was admitted to [facility] on 11/23/2022 with 1 day history of left-sided numbness with left lower extremity weakness followed by sudden onset of dizziness and diarrhea.  He was found to have acute infarct in [place] as well as unchanged nonenhancing right temporal lobe mass.  Dr. Selina Cooley felt the stroke was due to small vessel disease and recommended DAPT x 3 weeks followed by aspirin alone.  She also recommended cardiac monitor as well as hypercoagulable panel due to young age and results are pending.  JAK2 analysis and erythropoietin levels were ordered for workup of polycythemia.  He continued to be limited by left-sided sensory deficits, left knee instability as well as decrease in fine motor movement of RUE.  Therapy was recommended due to  functional decline.   Hospital Course: Corey Chang was admitted to rehab 11/26/2022 for inpatient therapies to consist of PT, ST and OT at least three hours five days a week. Past admission physiatrist, therapy team and rehab RN have worked together to provide customized collaborative inpatient rehab.  He is to continue DAPT x 21 days Trazodone has been used as needed to help with insomnia.  Hyponatremia noted at admission, has been monitored during his stay and is slowly improving.  His p.o. intake has been good and is continent of bowel and bladder.  His blood pressures were monitored on TID basis and has been stable.  Follow-up CBC shows H&H down to 16.9.  He has been seizure-free during his stay.  He reported diarrhea at admission and fiber added for bulking with good results. JAK 2 results still pending. B 12 levels normal. Lupus anticoagulant not detected and He made good gains during his stay and was modified independent in home setting. He will continue to receive follow up outpatient PT, OT and ST at [facility] after discharge.    Rehab course: During patient's stay in rehab team conferences were held to monitor patient's progress, set goals and discuss barriers to discharge. At admission, patient required min to mod assist with basic ADL tasks and min assist with mobility. He exhibited mild dysarthria  and mild deficits in higher level tasks.He has had improvement in activity tolerance, balance, postural control as well as ability to compensate for deficits. He is able to complete ADL tasks at modified independent level. He is independent for transfers and is able to ambulate 300' with use of LLE Swedish Knee cage independently  in supervised setting and with supervision in community setting. His speech intelligibility has improved and he is able to utilize strategies for clarity as well as swallowing at modified independent level.  He is tolerating regular diet without any signs of  aspiration.   Discharge disposition: 01-Home or Self Care  Diet: Regular  Special Instructions Follow up on results of JAK 2 analysis labs.  No driving till cleared by MD. 3   Repeat BMET in 1-2 weeks to monitor sodium levels.  4.  Continue Plavix for 10 more days.   Discharge Instructions     Ambulatory referral to Neurology   Complete by: As directed    An appointment is requested in approximately: 4 weeks--stroke follow up/Pt of Dr. Karel Jarvis   Ambulatory referral to Physical Medicine Rehab   Complete by: As directed       Allergies as of 12/03/2022       Reactions   Carbatrol [carbamazepine]    Not sure but may have caused a rash   Sulfa Antibiotics Rash        Medication List     TAKE these medications    acetaminophen 325 MG tablet Commonly known as: TYLENOL Take 1-2 tablets (325-650 mg total) by mouth every 4 (four) hours as needed for mild pain. What changed:  how much to take reasons to take this   albuterol 108 (90 Base) MCG/ACT inhaler Commonly known as: VENTOLIN HFA Inhale 2 puffs into the lungs every 6 (six) hours as needed for wheezing or shortness of breath.   aspirin EC 81 MG tablet Take 1 tablet (81 mg total) by mouth daily. Swallow whole.   atorvastatin 80 MG tablet Commonly known as: LIPITOR Take 1 tablet (80 mg total) by mouth daily.   clopidogrel 75 MG tablet Commonly known as: PLAVIX Take 1 tablet (75 mg total) by mouth daily for 10 days.   LamoTRIgine 200 MG Tb24 24 hour tablet Take 1 tablet (200 mg total) by mouth every evening.   levETIRAcetam 500 MG tablet Commonly known as: KEPPRA Take 3 tablets (1,500 mg total) by mouth 2 (two) times daily.   loratadine 10 MG tablet Commonly known as: CLARITIN Take 10 mg by mouth daily.   pantoprazole 40 MG tablet Commonly known as: Protonix Take 1 tablet (40 mg total) by mouth daily.   polycarbophil 625 MG tablet Commonly known as: FIBERCON Take 1 tablet (625 mg total) by mouth  daily.   polyethylene glycol 17 g packet Commonly known as: MIRALAX / GLYCOLAX Take 17 g by mouth daily as needed. What changed:  when to take this reasons to take this        Follow-up Information     Gweneth Dimitri, MD Follow up.   Specialty: Family Medicine Why: Call in 1-2 days for post hospital follow up Contact information: [address] [place] [address] [phone]         Erick Colace, MD Follow up.   Specialty: Physical Medicine and Rehabilitation Why: office will call you with follow up appointment Contact information: [address] [place] [address] [phone]  Van Clines, MD Follow up.   Specialty: Neurology Why: office will call you with follow up appointment Contact information: [address] [place] [address] [phone]                 Signed: Jacquelynn Cree 12/03/2022, 3:31 PM

## 2022-12-02 NOTE — Progress Notes (Signed)
PROGRESS NOTE   Subjective/Complaints:  No issues overnite.  Discussed need to do outpt rehab and see me in clinic prior to returning to work   ROS: No CP, SOB, N/V/D   Objective:   No results found. Recent Labs    11/29/22 0920  WBC 5.4  HGB 16.9  HCT 49.4  PLT 232    Recent Labs    11/29/22 0920  NA 132*  K 3.6  CL 97*  CO2 24  GLUCOSE 83  BUN 13  CREATININE 1.09  CALCIUM 9.8     Intake/Output Summary (Last 24 hours) at 12/02/2022 0847 Last data filed at 12/01/2022 1854 Gross per 24 hour  Intake 600 ml  Output --  Net 600 ml         Physical Exam: Vital Signs Blood pressure 111/82, pulse 73, temperature 97.6 F (36.4 C), resp. rate 17, height 5\' 11"  (1.803 m), weight 88.5 kg, SpO2 97 %.   General: No acute distress Mood and affect are appropriate Heart: Regular rate and rhythm no rubs murmurs or extra sounds Lungs: Clear to auscultation, breathing unlabored, no rales or wheezes Abdomen: Positive bowel sounds, soft nontender to palpation, nondistended Extremities: No clubbing, cyanosis, or edema Skin: No evidence of breakdown, no evidence of rash   Skin: C/D/I. No apparent lesions. Neuro: very mild dysarthria MsK: no pain with left hip int/ext rotation   PRIOR EXAMS: MSK:      No apparent deformity.      Strength:                RUE: 5/5 SA, 5/5 EF, 5/5 EE, 5/5 WE, 5/5 FF, 5/5 FA                 LUE: 4-/5 throughout                RLE: 5/5 HF, 5/5 KE, 5/5 DF, 5/5 EHL, 5/5 PF                 LLE:  5-/5 throughout      Coordination:  Dysmetria with left FTN and decrease in motor control LLE.  Spasticity: MAS 0 in all extremities.  Assessment/Plan: 1. Functional deficits which require 3+ hours per day of interdisciplinary therapy in a comprehensive inpatient rehab setting. Physiatrist is providing close team supervision and 24 hour management of active medical problems listed  below. Physiatrist and rehab team continue to assess barriers to discharge/monitor patient progress toward functional and medical goals  Care Tool:  Bathing    Body parts bathed by patient: Left arm, Chest, Abdomen, Front perineal area, Right upper leg, Left upper leg, Face, Buttocks, Right arm, Right lower leg, Left lower leg   Body parts bathed by helper: Right arm, Buttocks, Right lower leg, Left lower leg     Bathing assist Assist Level: Supervision/Verbal cueing     Upper Body Dressing/Undressing Upper body dressing   What is the patient wearing?: Pull over shirt    Upper body assist Assist Level: Supervision/Verbal cueing    Lower Body Dressing/Undressing Lower body dressing      What is the patient wearing?: Pants, Underwear/pull up  Lower body assist Assist for lower body dressing: Supervision/Verbal cueing     Toileting Toileting    Toileting assist Assist for toileting: Supervision/Verbal cueing     Transfers Chair/bed transfer  Transfers assist     Chair/bed transfer assist level: Minimal Assistance - Patient > 75%     Locomotion Ambulation   Ambulation assist      Assist level: Supervision/Verbal cueing Assistive device: No Device Max distance: 250   Walk 10 feet activity   Assist     Assist level: Supervision/Verbal cueing Assistive device: No Device   Walk 50 feet activity   Assist    Assist level: Supervision/Verbal cueing Assistive device: No Device    Walk 150 feet activity   Assist    Assist level: Supervision/Verbal cueing Assistive device: No Device    Walk 10 feet on uneven surface  activity   Assist     Assist level: Supervision/Verbal cueing Assistive device: Other (comment) (no AD)   Wheelchair     Assist Is the patient using a wheelchair?: No             Wheelchair 50 feet with 2 turns activity    Assist            Wheelchair 150 feet activity     Assist           Blood pressure 111/82, pulse 73, temperature 97.6 F (36.4 C), resp. rate 17, height 5\' 11"  (1.803 m), weight 88.5 kg, SpO2 97 %.  Medical Problem List and Plan: 1. Functional deficits secondary to  right medullary CVA             -patient may shower             -ELOS/Goals: 5/18, SPV goals PT/OT, Mod I SLP   May have grounds pass  2.  Antithrombotics: -DVT/anticoagulation:  Pharmaceutical: Lovenox 40mg  QD             -antiplatelet therapy: ASA 81 mg, Plavix 75 mg (x21 days) 3. Pain Management: Tylenol prn.  Left groin pain- hx AVN  4. Mood/Behavior/Sleep: LCSW to follow for evaluation and support.  --insomnia due to noises in the hospital/bored and sleeping during the day. Trazodone prn ordered.              -antipsychotic agents: N/A 5. Neuropsych/cognition: This patient is capable of making decisions on his own behalf. 6. Skin/Wound Care: Routine pressure relief measures.  7. Fluids/Electrolytes/Nutrition: Monitor I/O. Monitor routine labs 8. Hyponatremia: Question baseline.  Na+ improved to 132 no specific intervention needed     Latest Ref Rng & Units 11/29/2022    9:20 AM 11/28/2022    5:32 AM 11/27/2022    5:20 AM  BMP  Glucose 70 - 99 mg/dL 83  92  97   BUN 6 - 20 mg/dL 13  14  14    Creatinine 0.61 - 1.24 mg/dL 1.61  0.96  0.45   Sodium 135 - 145 mmol/L 132  130  130   Potassium 3.5 - 5.1 mmol/L 3.6  3.5  3.7   Chloride 98 - 111 mmol/L 97  100  101   CO2 22 - 32 mmol/L 24  20  18    Calcium 8.9 - 10.3 mg/dL 9.8  9.3  9.6     9. Hyperlipidemia: LDL-192, Trig-254, HDL-35             --now on high dose statin, Atorvastatin 80mg  QD.  10. H/o Seizures: Started 1998 and  last 02/2022 per records.Likely associated with hx of oligodendrogioma  Followed by Dr. Karel Jarvis.             --on Lamictal  XR 200 mg/pm and Keppra 1500 mg BID 11. Constipation: Has resolved and does not want any laxative scheduled. PRNs available 12. Seasonal allergies: continue claritin 10mg  QD 13.  Polycythemia:  Hgb stable 18 range, defer to primary team,     Latest Ref Rng & Units 11/29/2022    9:20 AM 11/27/2022    5:20 AM 11/26/2022    4:33 AM  CBC  WBC 4.0 - 10.5 K/uL 5.4  6.6    Hemoglobin 13.0 - 17.0 g/dL 96.2  95.2  84.1   Hematocrit 39.0 - 52.0 % 49.4  51.7    Platelets 150 - 400 K/uL 232  218      14. Diarrhea, started 11/26/22   resolved   15.  Elevated homocysteine level may be CVA risk factor , will check serum B12 and supplement as needed  Result pending  LOS: 6 days A FACE TO FACE EVALUATION WAS PERFORMED  Corey Chang 12/02/2022, 8:47 AM

## 2022-12-02 NOTE — Progress Notes (Signed)
Occupational Therapy Session Note  Patient Details  Name: Corey Chang MRN: [id number] Date of Birth: 02-07-1981  Today's Date: 12/02/2022 OT Individual Time: 0950-1100 OT Individual Time Calculation (min): 70 min    Short Term Goals: Week 1:  OT Short Term Goal 1 (Week 1): STG=LTG d/t ELOS  Skilled Therapeutic Interventions/Progress Updates:     Pt received sitting up in recliner presenting to be in good spirits receptive to skilled OT session reporting 0/10 pain- OT offering intermittent rest breaks, repositioning, and therapeutic support to optimize participation in therapy session. Pt able to donn L Swedish knee cape at beginning of session mod I with increased time provided. Completed beginning of session outside to support Pt moral and increase motivation for participation in therapy sessions today.   Pt ambulated through hallways to outdoor location >300 ft without AD not LOB or SOB noted. Pt able to utilize hospital signs to navigate to outdoor location with min VB cues to locate hospital signs. Engaged Pt in simulated community mobility activities ambulating across uneven side walks, over curbs, up/down inclines, and ambulating across uneven terrenes with Pt able to complete mod I no LOB. Educated on environmental factors and considerations in the community including seating options and barriers such at pedestrians, rugs/carpets, or carpets/children that could cause fall during community mobility. Pt receptive to education and able to compete transfers on/off public benches, elevated chairs, and booths mod I.  Pt inquiring about when he will be able to return to work. Educated Pt on purpose of follow-up OT and PT services on increasing skills required to return to work and providing approval when Pt's functionally able to meet all necessary job requirements when able. Pt stating "I have to be able to do my entire job without help when I go back". Discussed work requirements with Pt  reporting he must he must be able to use a key board, maintain his balance in standing >10 minutes at a time, and transport items 25-50+ pounds. Educated Pt on option of job site providing reasonable accommodations with Pt receptive to education.   Pt ambulated to therapy gym without AD >300 ft without AD no LOB noted with seated rest break provided following.  Engaged Pt in FM activities to increase FM/VM control required for BADL and IADL tasks.  -Pt completed card flipping task to improve translation with Pt able to lay out cards using his L hand without dropping. Pt then flipped over cards using L hand- attempting to use compensatory strategy of sliding card to edge of table vs applying pressure onto card to lift from table surface. Pt able to complete with increased time and mod VB cueing for technique.  -Engaged Pt in in-hand manipulation activity using coins with Pt instructed to translate coins from palm to finger tips and place into bank. Pt able to complete with mod cueing for technique and for finger isolation during task. Graded up task to incorporate storing coins in hand while completing palm to finger tip translation, however mod dropping present so task graded back down.  -Engaged Pt in VM/FM peg board activity with Pt using LUE to place small pegs into pegs board and then remove using tweezers for increased challenge. Pt noted to demonstrate challenges with grading force required to place/remove pegs, however able to complete with increased time with min-mod dropping.   Pt ambulated back to room without AD mod I. Spoke with medical team and agreed to make Pt mod I in room this session with nursing  staff informed. Pt was left resting in recliner with call bell in reach and all needs met.    Therapy Documentation Precautions:  Precautions Precautions: Fall, Other (comment) Precaution Comments: L knee instability Restrictions Weight Bearing Restrictions: No  Therapy/Group: Individual  Therapy  Army Fossa 12/02/2022, 8:08 AM

## 2022-12-02 NOTE — TOC Transition Note (Signed)
TOC discharge medications READY and LOCATED in TOC pharmacy on 2nd floor AWAITING pt discharge.  

## 2022-12-02 NOTE — Progress Notes (Signed)
Physical Therapy Session Note  Patient Details  Name: Corey Chang MRN: [id number] Date of Birth: [birth year]  Today's Date: 12/02/2022 PT Individual Time: 4098-1191 PT Individual Time Calculation (min): 45 min   Short Term Goals: Week 1:  PT Short Term Goal 1 (Week 1): Pt will perform sit<>stands using LRAD with CGA PT Short Term Goal 2 (Week 1): Pt will perform bed<>chair transfers using LRAD with CGA PT Short Term Goal 3 (Week 1): Pt will ambulate at least 115ft using LRAD with CGA PT Short Term Goal 4 (Week 1): Pt will navigate at least 4 steps using HRs with CGA  Skilled Therapeutic Interventions/Progress Updates: Pt presents sitting in recliner and agreeable to therapy.  Pt dons SKC independently.  Pt amb multiple trials during session w/o AD and supervision up to 250'.  Pt has 1 episode per trial of catching L toes, but w/o LOB.  Educated pt on being aware of fatigue to decrease risk of falls.  Pt performed step-ups to metal stool w/ LLE for increased strength, 3 x 15.  Pt performed amb holding 3# weight to L hand.  PT simulated items to be lifted off floor, platform, round bolster and amb carrying items on 1 side then other, walking up/down ramp and across mulch.  Pt opening/closing doors w/ L hand.  Pt stepping over platforms w/o LOB.  Pt returned to room and remained sitting in recliner w/ chair alarm on and all needs in reach.  Pt doffed SKC independently.     Therapy Documentation Precautions:  Precautions Precautions: Fall, Other (comment) Precaution Comments: L knee instability Restrictions Weight Bearing Restrictions: No General:   Vital Signs:   Pain:0/10 Pain Assessment Pain Scale: 0-10 Pain Score: 0-No pain    Therapy/Group: Individual Therapy  Lucio Angad 12/02/2022, 9:30 AM

## 2022-12-02 NOTE — Discharge Summary (Signed)
Physical Therapy Discharge Summary  Patient Details  Name: Corey Chang MRN: [id number] Date of Birth: [birth year]  Date of Discharge from PT service:Dec 03, 2022  Today's Date: 12/03/2022 PT Individual Time: 1445-1530 PT Individual Time Calculation (min): 45 min    Patient has met 8 of 8 long term goals due to improved balance, increased strength, improved awareness, and improved coordination.  Patient to discharge at an ambulatory level independent/Supervision without AD, using Swedish Knee Cage. Patient's care partner attended hands-on education/training and is independent to provide the necessary physical assistance at discharge.  Reasons goals not met: All goals met.  Pt will be D/C'd to home w/ family supervision as necessary.  Pt able to don Swedish Knee cage independently and min toe catching L foot w/o LOB.  Pt aware of need for concentration to maintain DF for safe gait.  Recommendation:  Patient will benefit from ongoing skilled PT services in outpatient setting to continue to advance safe functional mobility, address ongoing impairments in higher level dynamic balance and higher level dynamic gait training, return to work training, L hemibody NMR, and minimize fall risk.  Equipment: Swedish knee cage for L LE  Reasons for discharge: treatment goals met and discharge from hospital  Patient/family agrees with progress made and goals achieved: Yes  Pt presents amb inside room and eager for therapy for D/C this PM.  Pt performed for a total of 971' w/ min toe catching L foot but no LOB.  Pt aware of fatigue to LLE and need for rest to avoid falls.  Pt performed FGA and scored 23 which indicates medium risk for falls.  Pt has improved from score of 19.  Discussed w/ pt change in scores and meaning behind.  Pt has progressed from "high" medium risk to "low" medium risk.  Pt returned to room and was gathering items for pending D/C, spouse present in room.  PT  Discharge Precautions/Restrictions Precautions Precautions: Fall;Other (comment) Precaution Comments: L knee instability with hyperextension bias Restrictions Weight Bearing Restrictions: No Pain Pain Assessment Pain Scale: 0-10 Pain Score: 0-No pain Pain Interference Pain Interference Pain Effect on Sleep: 0. Does not apply - I have not had any pain or hurting in the past 5 days Pain Interference with Therapy Activities: 1. Rarely or not at all Pain Interference with Day-to-Day Activities: 1. Rarely or not at all Vision/Perception  Vision - History Ability to See in Adequate Light: 0 Adequate Vision - Assessment Eye Alignment: Within Functional Limits Ocular Range of Motion: Within Functional Limits Alignment/Gaze Preference: Within Defined Limits Tracking/Visual Pursuits: Able to track stimulus in all quads without difficulty Saccades: Additional eye shifts occurred during testing Convergence: Within functional limits Perception Perception: Within Functional Limits Praxis Praxis: Intact  Cognition Overall Cognitive Status: Within Functional Limits for tasks assessed Arousal/Alertness: Awake/alert Orientation Level: Oriented X4 Year: 2024 Month: May Day of Week: Correct Attention: Focused;Sustained Focused Attention: Appears intact Sustained Attention: Appears intact Memory: Appears intact Awareness: Appears intact Safety/Judgment: Appears intact Sensation Sensation Central sensation comments: reports "painful tingling" in L UE and LE has improved and now it is more of a "dull tingling" with UE more prominent than LE - is able to feel touch on screen in L LE but not the same sensation as R LE Light Touch Impaired Details: Impaired LLE Hot/Cold: Not tested Proprioception: Impaired Detail Proprioception Impaired Details: Impaired LLE;Impaired LUE (unable to feel joint alignment in L LE during gait or other functional mobility tasks) Stereognosis: Not  tested  Coordination Gross Motor Movements are Fluid and Coordinated: Yes Coordination and Movement Description: continues to have 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 but improved since initial evaluation Motor  Motor Motor: Other (comment) Motor - Discharge Observations: 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) but improved since initial evaluation  Mobility Bed Mobility Bed Mobility: Supine to Sit;Sit to Supine Supine to Sit: Independent Sit to Supine: Independent Transfers Transfers: Sit to Stand;Stand to [employer] Transfers Sit to Stand: Independent Stand to Sit: Independent Stand Pivot Transfers: Independent Transfer (Assistive device): None (Swedish knee cage) Therapist, nutritional Assistance: Independent;Supervision/Verbal cueing (supervision outside or in community) Educational psychologist (Feet): 300 Feet Assistive device: None;Other (Comment) (wearing L LE Swedish knee cage) Gait Gait: Yes Gait Pattern: Impaired Gait Pattern: Left genu recurvatum;Decreased stance time - left;Decreased hip/knee flexion - left (occasionally catches L toes during swing) Gait velocity: improved Stairs / Additional Locomotion Stairs: Yes Stairs Assistance: Supervision/Verbal cueing Stair Management Technique: One rail Right;Alternating pattern;Forwards Number of Stairs: 24 Height of Stairs: 6 Ramp: Supervision/Verbal cueing Curb: Supervision/Verbal cueing Wheelchair Mobility Wheelchair Mobility: No  Trunk/Postural Assessment  Cervical Assessment Cervical Assessment: Within Functional Limits Thoracic Assessment Thoracic Assessment: Exceptions to [facility] (mild rounded shoulders) Lumbar Assessment Lumbar Assessment: Within Functional Limits Postural Control Postural Control: Within Functional Limits  Balance Balance Balance Assessed: Yes Static Sitting Balance Static Sitting - Balance Support: Feet supported Static Sitting - Level of Assistance: 7: Independent Dynamic Sitting  Balance Dynamic Sitting - Balance Support: Feet supported Dynamic Sitting - Level of Assistance: 7: Independent Static Standing Balance Static Standing - Balance Support: During functional activity Static Standing - Level of Assistance: 7: Independent Dynamic Standing Balance Dynamic Standing - Balance Support: During functional activity Dynamic Standing - Level of Assistance: 7: Independent;5: Stand by assistance Extremity Assessment      RLE Assessment RLE Assessment: Within Functional Limits Active Range of Motion (AROM) Comments: WFL/WNL General Strength Comments: assessed in sitting LLE Assessment LLE Assessment: Exceptions to [facility] General Strength Comments: assessed in sitting; pt with hx of L hip AVN with some pain limiting LLE Strength Left Hip Flexion: 4/5 Left Knee Flexion: 4/5 Left Knee Extension: 4+/5 Left Ankle Dorsiflexion: 4/5 Left Ankle Plantar Flexion: 4/5   Lucio Jhonathan, PT   Ginny Forth , PT, DPT, NCS, CSRS 12/02/2022, 3:50 PM

## 2022-12-02 NOTE — Patient Care Conference (Signed)
Inpatient RehabilitationTeam Conference and Plan of Care Update Date: 12/01/2022   Time: 10:54 AM    Patient Name: Corey Chang      Medical Record Number: [id number]  Date of Birth: 1981-05-14 Sex: Male         Room/Bed: 4W26C/4W26C-01 Payor Info: Payor: [employer] / Plan: [facility] PPO / Product Type: *No Product type* /    Admit Date/Time:  11/26/2022 12:52 PM  Primary Diagnosis:  Acute stroke of medulla oblongata [facility])  Hospital Problems: Principal Problem:   Acute stroke of medulla oblongata [name])    Expected Discharge Date: Expected Discharge Date: 12/04/22  Team Members Present: Physician leading conference: Dr. Claudette Laws Social Worker Present: Lavera Guise, BSW Nurse Present: Chana Bode, RN PT Present: Casimiro Needle, PT OT Present: Bonnell Public, OT SLP Present: Other (comment) Fae Pippin, SLP) PPS Coordinator present : Fae Pippin, SLP     Current Status/Progress Goal Weekly Team Focus  Bowel/Bladder   This  patient is continent of bladder/bowel, LBM 11/30/22,   Maintain regularity of bowels/bladder   Assess toileting needs QS/PRN    Swallow/Nutrition/ Hydration   regular/ thin no straws   sup a  consume thin liquids via straws. Pt has met this goal.    ADL's   UB bathing/dressing superivsion, LB bathing/dessing CGA, CGA toilet/shower transfers, supervision grooming/hygine tasks,   supervision BADLs, CGA transfers, Min A IADLs   BADL retrianing, IADL retraining, family education, LUE NMR, activity tolerance, dynamic standing balance, functional mobility training    Mobility   mod-I bed mobility, CGA sit<>stand & stand pivot transfers no UE support, CGA/min A gait >284ft no UE support, CGA 12 steps using B HRs   supervision overall at ambulatory level  pt education, L hemibody NMR, dynamic standing balance, dynamic gait training, L LE strengthening, activity tolerance, D/C planning    Communication   Supervision  for speech intelligibility   Sup A   pt/ family education    Safety/Cognition/ Behavioral Observations  min A for alternating and diviided attention   min A   pt/ caregiver education and addressing awareness of limitations    Pain   Patient denies pain   Remain pain free   Assess Pain QS/PRN    Skin   Skin intact   Maintain skin integrity with no breakdown  Assess Skin QS/PRN      Discharge Planning:  Discharging home with spouse and younger children who are 2,9 & 12. Patient lives in a 2 level home, 2 steps and able to maintain on main level.   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  Patient on target to meet rehab goals: yes, currently needs CGA without an assistive device to ambulate up to 200'.  Goals for discharge set for supervision overall with min assist for SLP.  *See Care Plan and progress notes for long and short-term goals.   Revisions to Treatment Plan:  Swedish knee cage   Teaching Needs: Safety, medications, dietary modifications, transfers, etc.   Current Barriers to Discharge: Decreased caregiver support and Home enviroment access/layout  Possible Resolutions to Barriers: Family education OP follow up services No DME needs     Medical Summary Current Status: No seizures since rehab admit,increasing strength, knee hyperext on Left side  Barriers to  Discharge: Medical stability   Possible Resolutions to Barriers/Weekly Focus: check B12 level initiate supplementation if needed, may need orthotic management   Continued Need for Acute Rehabilitation Level of Care: The patient requires daily medical management by a physician with specialized training in physical medicine and rehabilitation for the following  reasons: Direction of a multidisciplinary physical rehabilitation program to maximize functional independence : Yes Medical management of patient stability for increased activity during participation in an intensive rehabilitation regime.: Yes Analysis of laboratory values and/or radiology reports with any subsequent need for medication adjustment and/or medical intervention. : Yes   I attest that I was present, lead the team conference, and concur with the assessment and plan of the team.   Chana Bode B 12/02/2022, 9:15 AM

## 2022-12-02 NOTE — Progress Notes (Signed)
Patient ID: Corey Chang, male   DOB: June 01, 1981, 42 y.o.   MRN: [id number]  NO DME needs. Pt OP orders sent to [facility].

## 2022-12-03 ENCOUNTER — Other Ambulatory Visit (HOSPITAL_COMMUNITY): Payer: Self-pay

## 2022-12-03 NOTE — Progress Notes (Signed)
Occupational Therapy Discharge Summary  Patient Details  Name: Corey Chang MRN: [id number] Date of Birth: 01/29/1981  Date of Discharge from OT service:Dec 03, 2022  Today's Date: 12/03/2022 OT Individual Time: 4098-1191 OT Individual Time Calculation (min): 56 min  OT Individual Time: 1300-1400 OT Individual Time Calculation (min): 60 min   Patient has met 11 of 11 long term goals due to improved activity tolerance, improved balance, postural control, ability to compensate for deficits, functional use of  LEFT upper and LEFT lower extremity, and improved coordination.  Patient to discharge at overall Modified Independent level.  Patient's care partner is independent to provide the necessary physical assistance at discharge.    Reasons goals not met: All goals met  Recommendation:  Patient will benefit from ongoing skilled OT services in outpatient setting to continue to advance functional skills in the area of BADL, iADL, Vocation, and Reduce care partner burden.  Equipment: No equipment provided. Pt reports he has a shower chair at home. Family plans to instal grab bars in the shower to increase Pt's safety.  Reasons for discharge: treatment goals met and discharge from hospital  Patient/family agrees with progress made and goals achieved: Yes  OT Discharge Skilled Therapeutic Interventions/Progress Updates:  AM Session:  Pt received sitting up in recliner presenting to be in good spirits receptive to skilled OT session reporting 0/10 pain- OT offering intermittent rest breaks, repositioning, and therapeutic support to optimize participation in therapy session. Pt able to don Swedish knee cage independently at beginning of session. Pt ambulated to therapy gym without AD mod I no LOB noted for functional mobility and endurance training.   Reassessed Pt's grip strength to measure progress over Pt's hospital stay. Pt completed assessment seated EOM. R Dominant Hand:  Trial 1: 185  lbs  Trail 2: 145 lbs Trial 3: 178 lbs  Average: 169.33 lbs  L Nondominant (hemiparetic) Hand: Trial 1: 90 lbs Trial 2: 95 lbs Trial 3: 90 lbs Average: 91.67  Pt with significant improvement in strength in LUE with improved functional use during BADL and IADL tasks.  FAST-UL Outcome Measure  Hand-to-mouth (HtM) Movement Starting Position: Participant seated on a standard chair without armrests. Trunk leaning on back support of chair. Both hands placed in pronated position on the ipsilateral middle thigh. Feet placed flat on the floor. If participants have any difficulty in understanding instructions (i.e. aphasia) a visual demonstration is suggested. For each of the 5 tasks of the FAST-UL, the subject at first performs the movement with the less affected UL and then with the affected one. The movement can be repeated 3 times and the best score of the three attempts is assigned.   Instructions: Each subject is asked to move the hand towards the mouth, touch it with fingertips and return to the thigh. Motor task occurs without moving the trunk off the back support and without moving the head toward the hand.   Scoring: Clinical score from 0 to 3 is provided by comparing affected side with less affected one as follows: 0 = no movement at all. 1 = The movement task is not completed (less of 50% of the contralateral HtM movement). 2 = The movement task is not completed (more of 50% of the contralateral HtM movement but the mouth is not reached) or the movement task is completed with compensations. If the mouth is touched with the wrist or the palm or the movement is performed with head or trunk compensations (flexion of the head and  trunk towards the hand) the score is 2.   3 = movement carried out at 100% of the contralateral HtM movement. HtM occurs with adequate shoulder flexion and abduction, elbow flexion, and forearm supination. The mouth is touched with fingertips.  Patient Score:  3   Reach to Target (RtT) Movement Starting Position: Same starting conditions of HtM movement. Instructions: Each subject is asked to move the hand toward a target (i.e. the hand of the examiner) located in front of the subject in the ipsilateral workspace at shoulder height, at a distance corresponding to 100% of the fully extended UL within arm's reach (less affected arm as reference). Participants have to reach, touch the target, and return. Motor task occurs without moving the trunk off the back support. Scoring: Clinical score from 0 to 3 is provided by comparing affected side with less affected one as follows: 0 = no movement at all. 1 = The movement task is not completed (less of 50% of the contralateral RtT movement).  2 = The movement task is not completed (more of 50% of the contralateral RtT movement but the target is not reached) or the movement task is completed with compensations (i.e. the trunk loses contact with the back support of the chair with forward displacement, shoulder flexion occurs with excessive scapular elevation, or shoulder excessive abduction). If the target is reached with trunk or shoulder compensations for inadequate elbow and finger extension the score is 2.  3 = movement performed at 100% of the contralateral RtT. The target is reached with adequate shoulder flexion, elbow, wrist and finger extension.  Patient Score: 3   Prono-supination (PS) Movement Starting Position: Same starting conditions of HtM movement. Instructions: Motor task occurs without moving the trunk anteriorly or laterally, the medial side of the humerus is against the body, the forearm is fully pronated with the hand resting on the thigh. Scoring: Clinical score from 0 to 3 is provided by comparing paretic side with less affected one as follows: 0 = no movement at all. 1 = The movement task is not completed (less of 50% of the contralateral PS movement).  2 = The movement task is not  completed (more of 50% of the contralateral PS movement but the forearm is not fully supinated) or the movement task is completed with compensations (i.e. excessive trunk inclination, shoulder abduction). If the movement is completed with compensations at elbow, shoulder or trunk level the score is 2. 3 = movement performed at 100% of the contralateral PS (complete supination of the forearm with the dorsal part of the hand in contact with the thigh).   Patient Score: 3   Grasp and Release (GaR) Movement Starting position: Participant seated on a standard chair. Hip and knees in 90 flexion, feet flat on the floor. Upper limb (UL) resting on a table in front of the participant with approximately 90 elbow flexion, forearm pronated and fingers in a relaxed extended and adducted position.  Instructions: The subject performs a grasping movement of a cylindrical rigid glass (at least 6 cm diameter) placed proximally to an imaginary line connecting the distal joints of thumb and index finger. The subject is asked to grasp the glass, lift it at least 2 cm (elbow remains in contact with the table), and release it. Scoring:  Clinical score from 0 to 3 is provided by comparing affected side with less affected one as follows: 0 = No movement. The grasp is not possible. 1 = The movement task is not completed (  less of 50% of the task). Some prehension is possible but the grasp is not sufficiently stable to lift the object; the grasp can be performed with the use of the less affected hand only to stabilize the glass for inadequate hand/finger opening and the release is not possible. Some hand opening is required otherwise the score is 0. 2 = The movement task is not completed (more of 50% of the task). The object is grasped and lifted but it falls or the task is completed using alternative grasping strategies (i.e. multi-pulpar, palmar, digito palmar; grasping and releasing of the object is possible with abnormal  orientation of the wrist and fingers toward the object and the forearm is lifted off the table). 3 = The task is completed using the expected pattern (normal orientation of fingers or wrist toward the object, the grasp occurs with thumb and fingers in opposition, forearm supination, elbow flexion; thumb abduction and finger extension to release the object).  Patient Score: 3   Pinch and Release (PaR) Movement Starting position: Same starting conditions of GaR movement The participant performs a PaR movement of a pen placed on a table in the midline of an imaginary line connecting the distal joints of thumb and index finger. The participants asked to pinch the pen with the tips of thumb and index finger, lift it at least 2 cm (elbow remains in contact with the table), and release it. Clinical score from 0 to 3 is provided by comparing affected side with non-affected one as follows: 0 = No movement. The pinch is not possible. 1 = The movement task is not completed (less of 50% of the task). Some prehension is possible but the pinch is not sufficiently stable to lift the object; the pinch occurs with the use of the less affected hand to stabilize the object for inadequate finger opening and the release is not possible. Some fingers movement is required otherwise the score is 0. 2 = The movement task is not completed (more of 50% of the task). The object is pinched and lifted but it falls or the task is completed using alternative pinching strategies (e.g. pinching with all the fingers, tripod pinch, pinching and releasing of the object is possible with abnormal orientation of fingers and wrist toward the object and the forearm is lifted off the table). 3 = The task is completed using the expected pattern (normal orientation of fingers or wrist toward the object, the pinch occurs with opposition of pads of index finger and thumb, and wrist extension).  Patient Score: 3  Total score: 15/15  Pt  presenting with a two point increase compared to initial evaluation with increased FM strength and coordination noted.   Engaged Pt in LUE NMR activity using small sized bolts with emphasis on increasing control, coordination, and grading force. Pt able to initiate using LUE to don bolts into board with improved FM pincer grasp and motor control noted. Pt then removed pegs in similar fashion using LUE and stabilizing board with RUE. No dropping noted during task- increased time, mod VB cues required to avoid compensatory movements, and intermittent rest breaks provided.   Pt ambulated back to room at end of session mod I.   Pt was left resting in recliner with call bell in reach and all needs met. No need for chair alarm as Pt is mod I in his room.   PM Session:  Pt received sitting up in recliner presenting to be in good spirits receptive to skilled  OT session reporting 0/10 pain- OT offering intermittent rest breaks, repositioning, and therapeutic support to optimize participation in therapy session. Pt donned Swedish knee cage independently at beginning of session. Pt ambulated to therapy gym for functional mobility and endurance training.   Educated Pt on HEP with handout and demonstration provided of each UB exercise. Pt able to complete each movement using green (moderate resistance) therband with mod VB cues provided for technique, safety, and body alignment. Pt completed 2x10 reps of following exercises seated EOM:  -Resisted Chest pulls -PNF Strengthening Diagonal Cross Body Pulls -PNF Inferior Diagonal Pulls -Resisted external Rotation in Neutral-bilateral -Single UE Bicep Curls -Single UE Tricep Extensions -Wrist Curls -Wrist Pull Ups Pt verbalized understanding of HEP following education and demonstrated exercises as evidence of learning.   Engaged Pt in functional mobility activity to challenge Pt's balance and maintained grasps of LUE with supination facilitation. Simulated  carrying items at home in BUEs to simulate IADLs. Pt instructed to ambulate through hallway while holding an "ice cream cone" (cone with bean bag balanced on top) in each hand without dropping. Pt able to complete task with close supervision, no LOB noted, and min dropping in LUE. Mod VB cues required for LUE positioning d/t decreased attention to L hand during functional tasks.   Pt ambulated back to his room without AD mod I. Pt was left resting in recliner with call bell in reach and all needs met.   Precautions/Restrictions  Precautions Precautions: Fall;Other (comment) Precaution Comments: L knee instability with hyperextension bias Required Braces or Orthoses: Other Brace Other Brace: L Sweedish Knee Cage Restrictions Weight Bearing Restrictions: No General   Vital Signs Therapy Vitals Temp: 98.7 F (37.1 C) Temp Source: Oral Pulse Rate: 79 Resp: 16 BP: 113/78 Patient Position (if appropriate): Lying Oxygen Therapy SpO2: 95 % O2 Device: Room Air Pain Pain Assessment Pain Scale: 0-10 Pain Score: 0-No pain ADL ADL Eating: Independent Where Assessed-Eating: Edge of bed Grooming: Independent Where Assessed-Grooming: Standing at sink Upper Body Bathing: Modified independent Where Assessed-Upper Body Bathing: Shower Lower Body Bathing: Modified independent Where Assessed-Lower Body Bathing: Shower Upper Body Dressing: Independent Where Assessed-Upper Body Dressing: Edge of bed Lower Body Dressing: Independent Where Assessed-Lower Body Dressing: Edge of bed Toileting: Independent Where Assessed-Toileting: Teacher, adult education: Community education officer Method: Proofreader: Engineer, technical sales: Not assessed Film/video editor: Cytogeneticist Method: Designer, industrial/product: Grab bars, Sales promotion account executive Baseline Vision/History: 0 No visual deficits Patient Visual Report:  No change from baseline Vision Assessment?: Yes Eye Alignment: Within Functional Limits Ocular Range of Motion: Within Functional Limits Alignment/Gaze Preference: Within Defined Limits Tracking/Visual Pursuits: Able to track stimulus in all quads without difficulty Saccades: Additional eye shifts occurred during testing Convergence: Within functional limits Visual Fields: No apparent deficits Depth Perception: Undershoots Additional Comments: Mild dysmetria Perception  Perception: Within Functional Limits Praxis Praxis: Intact Cognition Cognition Overall Cognitive Status: Within Functional Limits for tasks assessed Arousal/Alertness: Awake/alert Orientation Level: Person;Place;Situation Person: Oriented Place: Oriented Situation: Oriented Memory: Appears intact Attention: Focused;Sustained;Selective;Alternating Focused Attention: Appears intact Sustained Attention: Appears intact Selective Attention: Appears intact Divided Attention: Appears intact Awareness: Appears intact Problem Solving: Appears intact Executive Function: Reasoning;Sequencing;Organizing;Self Monitoring;Self Correcting Reasoning: Appears intact Sequencing: Appears intact Organizing: Appears intact Self Monitoring: Appears intact Self Correcting: Appears intact Safety/Judgment: Appears intact Brief Interview for Mental Status (BIMS) Repetition of Three Words (First Attempt): 3 Temporal Orientation: Year: Correct Temporal Orientation: Month: Accurate within 5 days  Temporal Orientation: Day: Correct Recall: "Sock": Yes, no cue required Recall: "Blue": Yes, no cue required Recall: "Bed": Yes, no cue required BIMS Summary Score: 15 Sensation Sensation Light Touch: Impaired Detail Central sensation comments: reports "dull tingling" with L UE more prominent than LE - is able to feel touch on screen in L LE but not the same sensation as R LE Light Touch Impaired Details: Impaired LLE;Impaired  LUE Hot/Cold: Not tested Proprioception: Impaired Detail Proprioception Impaired Details: Impaired LLE;Impaired LUE Stereognosis: Not tested Coordination Gross Motor Movements are Fluid and Coordinated: Yes Fine Motor Movements are Fluid and Coordinated: No Finger Nose Finger Test: continues to have mild L delayed motor movements and dysmetria with undershooting, however improvement from inital evaluation Motor  Motor Motor - Discharge Observations: 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) but improved since initial evaluation Mobility  Bed Mobility Bed Mobility: Supine to Sit;Sit to Supine Supine to Sit: Independent Sit to Supine: Independent Transfers Sit to Stand: Independent Stand to Sit: Independent  Trunk/Postural Assessment  Cervical Assessment Cervical Assessment: Within Functional Limits Thoracic Assessment Thoracic Assessment: Exceptions to [facility] (mild rounded shoudlers) Lumbar Assessment Lumbar Assessment: Within Functional Limits Postural Control Postural Control: Within Functional Limits  Balance Balance Balance Assessed: Yes Static Sitting Balance Static Sitting - Balance Support: Feet supported Static Sitting - Level of Assistance: 7: Independent Dynamic Sitting Balance Dynamic Sitting - Balance Support: Feet supported Dynamic Sitting - Level of Assistance: 7: Independent Static Standing Balance Static Standing - Balance Support: During functional activity Static Standing - Level of Assistance: 7: Independent Dynamic Standing Balance Dynamic Standing - Balance Support: During functional activity Dynamic Standing - Level of Assistance: 7: Independent Extremity/Trunk Assessment RUE Assessment RUE Assessment: Within Functional Limits LUE Assessment LUE Assessment: Exceptions to [facility] General Strength Comments: 4/5 over all, mild delayed motor movements with undershooting a   Army Fossa 12/03/2022, 8:51 AM

## 2022-12-03 NOTE — Progress Notes (Addendum)
Inpatient Rehabilitation Discharge Medication Review by a Pharmacist  A complete drug regimen review was completed for this patient to identify any potential clinically significant medication issues.  High Risk Drug Classes Is patient taking? Indication by Medication  Antipsychotic No   Anticoagulant No   Antibiotic No   Opioid No   Antiplatelet Yes ASA/plavix - CVA  Hypoglycemics/insulin No   Vasoactive Medication No   Chemotherapy No   Other Yes Lipitor - HLD Albuterol - wheezing Keppra/lamictal - sz Claritin - allergy Protonix - GERD Fibercon/miralax - constipation     Type of Medication Issue Identified Description of Issue Recommendation(s)  Drug Interaction(s) (clinically significant)     Duplicate Therapy     Allergy     No Medication Administration End Date     Incorrect Dose     Additional Drug Therapy Needed     Significant med changes from prior encounter (inform family/care partners about these prior to discharge).    Other       Clinically significant medication issues were identified that warrant physician communication and completion of prescribed/recommended actions by midnight of the next day:  No  Name of provider notified for urgent issues identified:   Provider Method of Notification:     Pharmacist comments:   Time spent performing this drug regimen review (minutes):  20   Ulyses Southward, PharmD, [place], AAHIVP, CPP Infectious Disease Pharmacist 12/03/2022 8:27 AM

## 2022-12-03 NOTE — Discharge Instructions (Addendum)
Inpatient Rehab Discharge Instructions  Kamarie Kammeyer [facility] Discharge date and time: 12/03/22   Activities/Precautions/ Functional Status: Activity: no lifting, driving, or strenuous exercise till cleared by MD Diet: low fat, low cholesterol diet Wound Care: none needed   Functional status:  ___ No restrictions     ___ Walk up steps independently _X__ 24/7 supervision/assistance   ___ Walk up steps with assistance ___ Intermittent supervision/assistance  ___ Bathe/dress independently ___ Walk with walker     _X__ Bathe/dress with assistance ___ Walk Independently    ___ Shower independently ___ Walk with assistance    ___ Shower with assistance _X__ No alcohol     ___ Return to work/school ________   Special Instructions:  COMMUNITY REFERRALS UPON DISCHARGE:     Outpatient: PT     OT    ST                Agency: [facility] Phone: [phone]              Appointment Date/Time: TBD    My questions have been answered and I understand these instructions. I will adhere to these goals and the provided educational materials after my discharge from the hospital.  Patient/Caregiver Signature _______________________________ Date __________  Clinician Signature _______________________________________ Date __________  Please bring this form and your medication list with you to all your follow-up doctor's appointments.

## 2022-12-03 NOTE — Progress Notes (Signed)
Inpatient Rehabilitation Care Coordinator Discharge Note   Patient Details  Name: Corey Chang MRN: [id number] Date of Birth: March 27, 1981   Discharge location: Home  Length of Stay: 7 Days  Discharge activity level: MOD I  Home/community participation: Spouse  Patient response WU:[id number] Literacy - How often do you need to have someone help you when you read instructions, pamphlets, or other written material from your doctor or pharmacy?: Rarely  Patient response [id number] Isolation - How often do you feel lonely or isolated from those around you?: Never  Services provided included: MD, RD, PT, OT, SLP, RN, TR, CM, Pharmacy, SW, Neuropsych  Financial Services:  Field seismologist Utilized: Private Insurance [employer]  Choices offered to/list presented to: Pt  Follow-up services arranged:  Outpatient    Outpatient Servicies: Neuro Rehab      Patient response to transportation need: Is the patient able to respond to transportation needs?: Yes In the past 12 months, has lack of transportation kept you from medical appointments or from getting medications?: No In the past 12 months, has lack of transportation kept you from meetings, work, or from getting things needed for daily living?: No   Patient/Family verbalized understanding of follow-up arrangements:  Yes  Individual responsible for coordination of the follow-up plan: self/spouse  Confirmed correct DME delivered: Andria Rhein 12/03/2022    Comments (or additional information):  Summary of Stay    Date/Time Discharge Planning CSW  11/30/22 1442 Discharging home with spouse and younger children who are 2,9 & 12. Patient lives in a 2 level home, 2 steps and able to maintain on main level. CJB       Andria Rhein

## 2022-12-03 NOTE — Progress Notes (Signed)
Speech Language Pathology Discharge Summary  Patient Details  Name: Corey Chang MRN: [id number] Date of Birth: 03-13-1981  Date of Discharge from SLP service:Dec 03, 2022  Today's Date: 12/03/2022 SLP Individual Time: 4098-1191 SLP Individual Time Calculation (min): 45 min   Skilled Therapeutic Interventions: Pt was seen in am to address cognitive re- training, dysphagia management, and speech intelligibility. Pt was alert and seated upright in recliner upon SLP arrival. SLP reviewed speech intelligibility strategies including; over articulation and separating words. Pt was subsequently engaged in a spontaneous conversation with pt utilizing strategies with sup A. SLP further reviewed strategies of limiting distractions during higher level tasks (I.e. finances and medication management) and double checking work for accuracy. Pt verbalized understanding. SLP also reviewed safe swallowing strategies of oral hygiene BID, upright positioning during and after meals, slow rate and small single boluses/ sips. Pt able to teach back information with 100% acc. Pt left seated upright in recliner with call button within reach. SLP to continue POC  Patient has met 3 of 3 long term goals.  Patient to discharge at overall Modified Independent;Supervision level.  Reasons goals not met:     Clinical Impression/Discharge Summary: Pt has made excellent gains and has met 3 of 3 LTG's this admission due to improved divergent and alternating attention, speech intelligibility, and tolerance of regular solids and thin liquids. Pt is currently an overall mod I for swallowing and sup A for speech intelligibility and attention. Pt has ADHD at baseline. Pt consumes a regular solid diet and thin liquids with good awareness of strategies and precautions.  Pt/ family education complete and pt will discharge home with assistance as needed form family, friends, etc. No further skilled ST intervention is warranted at this time.   Care Partner:  Caregiver Able to Provide Assistance: Yes     Recommendation:  None    Reasons for discharge: Treatment goals met;Discharged from hospital   Patient/Family Agrees with Progress Made and Goals Achieved: Yes    Renaee Munda 12/03/2022, 12:09 PM

## 2022-12-03 NOTE — Plan of Care (Signed)
  Problem: RH Balance Goal: LTG Patient will maintain dynamic standing with ADLs (OT) Description: LTG:  Patient will maintain dynamic standing balance with assist during activities of daily living (OT)  Outcome: Completed/Met   Problem: Sit to Stand Goal: LTG:  Patient will perform sit to stand in prep for activites of daily living with assistance level (OT) Description: LTG:  Patient will perform sit to stand in prep for activites of daily living with assistance level (OT) Outcome: Completed/Met   Problem: RH Grooming Goal: LTG Patient will perform grooming w/assist,cues/equip (OT) Description: LTG: Patient will perform grooming with assist, with/without cues using equipment (OT) Outcome: Completed/Met   Problem: RH Bathing Goal: LTG Patient will bathe all body parts with assist levels (OT) Description: LTG: Patient will bathe all body parts with assist levels (OT) Outcome: Completed/Met   Problem: RH Dressing Goal: LTG Patient will perform upper body dressing (OT) Description: LTG Patient will perform upper body dressing with assist, with/without cues (OT). Outcome: Completed/Met Goal: LTG Patient will perform lower body dressing w/assist (OT) Description: LTG: Patient will perform lower body dressing with assist, with/without cues in positioning using equipment (OT) Outcome: Completed/Met   Problem: RH Toileting Goal: LTG Patient will perform toileting task (3/3 steps) with assistance level (OT) Description: LTG: Patient will perform toileting task (3/3 steps) with assistance level (OT)  Outcome: Completed/Met   Problem: RH Functional Use of Upper Extremity Goal: LTG Patient will use RT/LT upper extremity as a (OT) Description: LTG: Patient will use right/left upper extremity as a stabilizer/gross assist/diminished/nondominant/dominant level with assist, with/without cues during functional activity (OT) Outcome: Completed/Met   Problem: RH Simple Meal Prep Goal: LTG Patient  will perform simple meal prep w/assist (OT) Description: LTG: Patient will perform simple meal prep with assistance, with/without cues (OT). Outcome: Completed/Met   Problem: RH Toilet Transfers Goal: LTG Patient will perform toilet transfers w/assist (OT) Description: LTG: Patient will perform toilet transfers with assist, with/without cues using equipment (OT) Outcome: Completed/Met   Problem: RH Tub/Shower Transfers Goal: LTG Patient will perform tub/shower transfers w/assist (OT) Description: LTG: Patient will perform tub/shower transfers with assist, with/without cues using equipment (OT) Outcome: Completed/Met   

## 2022-12-03 NOTE — Plan of Care (Signed)
  Problem: RH Swallowing Goal: LTG Patient will consume least restrictive diet using compensatory strategies with assistance (SLP) Description: LTG:  Patient will consume least restrictive diet using compensatory strategies with assistance (SLP) Outcome: Completed/Met Flowsheets (Taken 12/03/2022 0903) LTG: Pt Patient will consume least restrictive diet using compensatory strategies with assistance of (SLP): Modified Independent   Problem: RH Expression Communication Goal: LTG Patient will increase speech intelligibility (SLP) Description: LTG: Patient will increase speech intelligibility at word/phrase/conversation level with cues, % of the time (SLP) Outcome: Completed/Met Flowsheets (Taken 12/03/2022 0903) LTG: Patient will increase speech intelligibility (SLP): Supervision   Problem: RH Attention Goal: LTG Patient will demonstrate this level of attention during functional activites (SLP) Description: LTG:  Patient will will demonstrate this level of attention during functional activites (SLP) Outcome: Completed/Met Flowsheets (Taken 12/03/2022 0903) Patient will demonstrate during cognitive/linguistic activities the attention type of:  Alternating  Divided Patient will demonstrate this level of attention during cognitive/linguistic activities in: Controlled LTG: Patient will demonstrate this level of attention during cognitive/linguistic activities with assistance of (SLP): Supervision

## 2022-12-03 NOTE — Progress Notes (Signed)
INPATIENT REHABILITATION DISCHARGE NOTE   Discharge instructions by:Pam PA  Verbalized understanding:yes  Skin care/Wound care healing? none  Pain:none  IV's:none  Tubes/Drains:none  O2:none  Safety instructions:done  Patient belongings:done  Discharged ZO:XWRU  Discharged via:car  Notes: Meds from [facility] with patient and wife

## 2022-12-03 NOTE — Progress Notes (Signed)
PROGRESS NOTE   Subjective/Complaints:  Discussed return to driving in 1-2 wks but to practice with another driver, [place]   ROS: No CP, SOB, N/V/D   Objective:   No results found. No results for input(s): "WBC", "HGB", "HCT", "PLT" in the last 72 hours.  No results for input(s): "NA", "K", "CL", "CO2", "GLUCOSE", "BUN", "CREATININE", "CALCIUM" in the last 72 hours.   Intake/Output Summary (Last 24 hours) at 12/03/2022 0808 Last data filed at 12/02/2022 1814 Gross per 24 hour  Intake 420 ml  Output --  Net 420 ml         Physical Exam: Vital Signs Blood pressure 113/78, pulse 79, temperature 98.7 F (37.1 C), temperature source Oral, resp. rate 16, height 5\' 11"  (1.803 m), weight 88.5 kg, SpO2 95 %.   General: No acute distress Mood and affect are appropriate Heart: Regular rate and rhythm no rubs murmurs or extra sounds Lungs: Clear to auscultation, breathing unlabored, no rales or wheezes Abdomen: Positive bowel sounds, soft nontender to palpation, nondistended Extremities: No clubbing, cyanosis, or edema Skin: No evidence of breakdown, no evidence of rash   Skin: C/D/I. No apparent lesions. Neuro: very mild dysarthria MsK: no pain with left hip int/ext rotation   PRIOR EXAMS: MSK:      No apparent deformity.      Strength:                RUE: 5/5 SA, 5/5 EF, 5/5 EE, 5/5 WE, 5/5 FF, 5/5 FA                 LUE: 4-/5 throughout                RLE: 5/5 HF, 5/5 KE, 5/5 DF, 5/5 EHL, 5/5 PF                 LLE:  5-/5 throughout      Coordination:  Dysmetria with left FTN and decrease in motor control LLE.  Spasticity: MAS 0 in all extremities.  Assessment/Plan: 1. Functional deficits which require 3+ hours per day of interdisciplinary therapy in a comprehensive inpatient rehab setting. Physiatrist is providing close team supervision and 24 hour management of  active medical problems listed below. Physiatrist and rehab team continue to assess barriers to discharge/monitor patient progress toward functional and medical goals  Care Tool:  Bathing    Body parts bathed by patient: Left arm, Chest, Abdomen, Front perineal area, Right upper leg, Left upper leg, Face, Buttocks, Right arm, Right lower leg, Left lower leg   Body parts bathed by helper: Right arm, Buttocks, Right lower leg, Left lower leg     Bathing assist Assist Level: Supervision/Verbal cueing     Upper Body Dressing/Undressing Upper body dressing   What is the patient wearing?: Pull over shirt    Upper body assist Assist Level: Supervision/Verbal cueing    Lower Body Dressing/Undressing Lower body dressing      What is the patient wearing?: Pants, Underwear/pull up     Lower body assist Assist for lower body dressing: Supervision/Verbal cueing     Toileting Toileting  Toileting assist Assist for toileting: Supervision/Verbal cueing     Transfers Chair/bed transfer  Transfers assist     Chair/bed transfer assist level: Minimal Assistance - Patient > 75%     Locomotion Ambulation   Ambulation assist      Assist level: Supervision/Verbal cueing Assistive device: No Device Max distance: 250   Walk 10 feet activity   Assist     Assist level: Supervision/Verbal cueing Assistive device: No Device   Walk 50 feet activity   Assist    Assist level: Supervision/Verbal cueing Assistive device: No Device    Walk 150 feet activity   Assist    Assist level: Supervision/Verbal cueing Assistive device: No Device    Walk 10 feet on uneven surface  activity   Assist     Assist level: Supervision/Verbal cueing Assistive device: Other (comment) (no AD)   Wheelchair     Assist Is the patient using a wheelchair?: No             Wheelchair 50 feet with 2 turns activity    Assist            Wheelchair 150 feet  activity     Assist          Blood pressure 113/78, pulse 79, temperature 98.7 F (37.1 C), temperature source Oral, resp. rate 16, height 5\' 11"  (1.803 m), weight 88.5 kg, SpO2 95 %.  Medical Problem List and Plan: 1. 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  Antithrombotics: -DVT/anticoagulation:  Pharmaceutical: Lovenox 40mg  QD             -antiplatelet therapy: ASA 81 mg, Plavix 75 mg (x21 days) 3. Pain Management: Tylenol prn.  Left groin pain- hx AVN  4. Mood/Behavior/Sleep: LCSW to follow for evaluation and support.  --insomnia due to noises in the hospital/bored and sleeping during the day. Trazodone prn ordered.              -antipsychotic agents: N/A 5. Neuropsych/cognition: This patient is capable of making decisions on his own behalf. 6. Skin/Wound Care: Routine pressure relief measures.  7. Fluids/Electrolytes/Nutrition: Monitor I/O. Monitor routine labs 8. Hyponatremia: Question baseline.  Na+ improved to 132 no specific intervention needed     Latest Ref Rng & Units 11/29/2022    9:20 AM 11/28/2022    5:32 AM 11/27/2022    5:20 AM  BMP  Glucose 70 - 99 mg/dL 83  92  97   BUN 6 - 20 mg/dL 13  14  14    Creatinine 0.61 - 1.24 mg/dL 1.61  0.96  0.45   Sodium 135 - 145 mmol/L 132  130  130   Potassium 3.5 - 5.1 mmol/L 3.6  3.5  3.7   Chloride 98 - 111 mmol/L 97  100  101   CO2 22 - 32 mmol/L 24  20  18    Calcium 8.9 - 10.3 mg/dL 9.8  9.3  9.6     9. Hyperlipidemia: LDL-192, Trig-254, HDL-35             --now on high dose statin, Atorvastatin 80mg  QD.  10. 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 Karel Jarvis.             --on Lamictal  XR 200 mg/pm and Keppra 1500 mg BID 11. Constipation: Has resolved  and does not want any laxative scheduled. PRNs available 12. Seasonal allergies: continue claritin 10mg  QD 13. Polycythemia:  Hgb stable 17-18 range,f/u PCP    Latest Ref Rng & Units 11/29/2022    9:20 AM 11/27/2022    5:20 AM 11/26/2022    4:33 AM  CBC  WBC 4.0 - 10.5 K/uL 5.4  6.6    Hemoglobin 13.0 - 17.0 g/dL 29.5  28.4  13.2   Hematocrit 39.0 - 52.0 % 49.4  51.7    Platelets 150 - 400 K/uL 232  218      14. Diarrhea, started 11/26/22   resolved   15.  Elevated homocysteine level but normal B12 LOS: 7 days A FACE TO FACE EVALUATION WAS PERFORMED  Erick Colace 12/03/2022, 8:08 AM

## 2022-12-06 ENCOUNTER — Telehealth: Payer: Self-pay

## 2022-12-06 NOTE — Transitions of Care (Post Inpatient/ED Visit) (Signed)
12/06/2022  Name: Corey Chang MRN: [id number] DOB: 1981/03/29  Today's TOC FU Call Status: Today's TOC FU Call Status:: Successful TOC FU Call Competed TOC FU Call Complete Date: 12/06/22  Transition Care Management Follow-up Telephone Call Date of Discharge: 12/03/22 Discharge Facility: [facility]) Type of Discharge: Inpatient Admission Primary Inpatient Discharge Diagnosis:: Acute stroke of medulla oblongata How have you been since you were released from the hospital?: Better Any questions or concerns?: No  Items Reviewed: Did you receive and understand the discharge instructions provided?: Yes Medications obtained,verified, and reconciled?: Yes (Medications Reviewed) Any new allergies since your discharge?: No Dietary orders reviewed?: NA Do you have support at home?: Yes  Medications Reviewed Today: Medications Reviewed Today     Reviewed by Leigh Aurora, CMA (Certified Medical Assistant) on 12/06/22 at 1548  Med List Status: <None>   Medication Order Taking? Sig Documenting Provider Last Dose Status Informant  acetaminophen (TYLENOL) 325 MG tablet [id number]  Take 1-2 tablets (325-650 mg total) by mouth every 4 (four) hours as needed for mild pain. Love, Pamela S, PA-C  Active   albuterol (VENTOLIN HFA) 108 (90 Base) MCG/ACT inhaler [id number] No Inhale 2 puffs into the lungs every 6 (six) hours as needed for wheezing or shortness of breath. Gweneth Dimitri, MD unknown Active Self  aspirin EC 81 MG tablet [id number]  Take 1 tablet (81 mg total) by mouth daily. Swallow whole. Jacquelynn Cree, PA-C  Active   atorvastatin (LIPITOR) 80 MG tablet [id number]  Take 1 tablet (80 mg total) by mouth daily. Jacquelynn Cree, PA-C  Active   clopidogrel (PLAVIX) 75 MG tablet [id number]  Take 1 tablet (75 mg total) by mouth daily for 10 days. Jerene Pitch  Active   lamoTRIgine 200 MG TB24 24 hour tablet [id number]  Take 1 tablet (200 mg total) by mouth every evening. Alford Highland, MD  Active   levETIRAcetam (KEPPRA) 500 MG tablet [id number] No Take 3 tablets (1,500 mg total) by mouth 2 (two) times daily. Van Clines, MD 11/23/2022 Active Self  loratadine (CLARITIN) 10 MG tablet [id number] No Take 10 mg by mouth daily. [provider] 11/22/2022 Active Self  pantoprazole (PROTONIX) 40 MG tablet [id number]  Take 1 tablet (40 mg total) by mouth daily. Alford Highland, MD  Active   polycarbophil (FIBERCON) 625 MG tablet [id number]  Take 1 tablet (625 mg total) by mouth daily. Love, Pamela S, PA-C  Active   polyethylene glycol (MIRALAX / GLYCOLAX) 17 g packet [id number]  Take 17 g by mouth daily as needed. Jacquelynn Cree, PA-C  Active             Home Care and Equipment/Supplies: Were Home Health Services Ordered?: NA Any new equipment or medical supplies ordered?: NA  Functional Questionnaire: Do you need assistance with bathing/showering or dressing?: No Do you need assistance with meal preparation?: No Do you need assistance with eating?: No Do you have difficulty maintaining continence: No Do you need assistance with getting out of bed/getting out of a chair/moving?: No Do you have difficulty managing or taking your medications?: No  Follow up appointments reviewed: PCP Follow-up appointment confirmed?: Yes Date of PCP follow-up appointment?: 12/07/22 Follow-up Provider: Dr. Toney Reil [facility] Follow-up appointment confirmed?: Yes Date of Specialist follow-up appointment?: 12/23/22 Follow-Up Specialty Provider:: Dr. Wynn Banker Do you need transportation to your follow-up appointment?: No Do you understand care options if your condition(s) worsen?: Yes-patient verbalized understanding    SIGNATURE Elease Hashimoto  Roxan Hockey, CMA (AAMA)  [facility] [phone]

## 2022-12-07 ENCOUNTER — Ambulatory Visit: Payer: BC Managed Care – PPO | Admitting: Nurse Practitioner

## 2022-12-07 VITALS — BP 136/76 | HR 85 | Temp 97.5°F | Resp 16 | Ht 71.0 in | Wt 189.1 lb

## 2022-12-07 DIAGNOSIS — G40909 Epilepsy, unspecified, not intractable, without status epilepticus: Secondary | ICD-10-CM

## 2022-12-07 DIAGNOSIS — K219 Gastro-esophageal reflux disease without esophagitis: Secondary | ICD-10-CM | POA: Diagnosis not present

## 2022-12-07 DIAGNOSIS — E871 Hypo-osmolality and hyponatremia: Secondary | ICD-10-CM

## 2022-12-07 DIAGNOSIS — I639 Cerebral infarction, unspecified: Secondary | ICD-10-CM

## 2022-12-07 DIAGNOSIS — C712 Malignant neoplasm of temporal lobe: Secondary | ICD-10-CM

## 2022-12-07 LAB — CBC
HCT: 49.5 % (ref 39.0–52.0)
Hemoglobin: 16.5 g/dL (ref 13.0–17.0)
MCHC: 33.4 g/dL (ref 30.0–36.0)
MCV: 90.5 fl (ref 78.0–100.0)
Platelets: 292 10*3/uL (ref 150.0–400.0)
RBC: 5.47 Mil/uL (ref 4.22–5.81)
RDW: 13.6 % (ref 11.5–15.5)
WBC: 5.1 10*3/uL (ref 4.0–10.5)

## 2022-12-07 LAB — COMPREHENSIVE METABOLIC PANEL
ALT: 54 U/L — ABNORMAL HIGH (ref 0–53)
AST: 46 U/L — ABNORMAL HIGH (ref 0–37)
Albumin: 4.7 g/dL (ref 3.5–5.2)
Alkaline Phosphatase: 110 U/L (ref 39–117)
BUN: 11 mg/dL (ref 6–23)
CO2: 27 mEq/L (ref 19–32)
Calcium: 10.1 mg/dL (ref 8.4–10.5)
Chloride: 100 mEq/L (ref 96–112)
Creatinine, Ser: 1.02 mg/dL (ref 0.40–1.50)
GFR: 91.53 mL/min (ref >60.00)
Glucose, Bld: 77 mg/dL (ref 70–99)
Potassium: 4 mEq/L (ref 3.5–5.1)
Sodium: 135 mEq/L (ref 135–145)
Total Bilirubin: 0.6 mg/dL (ref 0.2–1.2)
Total Protein: 7.5 g/dL (ref 6.0–8.3)

## 2022-12-07 LAB — JAK2 V617F RFX CALR/MPL/E12-15

## 2022-12-07 LAB — CALR +MPL + E12-E15  (REFLEX)

## 2022-12-07 MED ORDER — ATORVASTATIN CALCIUM 80 MG PO TABS
80.0000 mg | ORAL_TABLET | Freq: Every day | ORAL | 0 refills | Status: DC
Start: 2022-12-07 — End: 2023-01-05

## 2022-12-07 MED ORDER — PANTOPRAZOLE SODIUM 40 MG PO TBEC
40.0000 mg | DELAYED_RELEASE_TABLET | Freq: Every day | ORAL | 0 refills | Status: DC
Start: 2022-12-07 — End: 2023-01-05

## 2022-12-07 NOTE — Assessment & Plan Note (Signed)
Patient currently maintained on lamotrigine and levetiracetam.  He is followed by Dr. [name] through [facility] neurology.  Has an appointment coming up in approximately 3 weeks for CVA follow-up.

## 2022-12-07 NOTE — Progress Notes (Signed)
Acute Office Visit  Subjective:     Patient ID: Corey Chang, male    DOB: 1981-06-10, 42 y.o.   MRN: [id number]  Chief Complaint  Patient presents with   Hospitalization Follow-up    HPI Patient is in today for hospital follow up   Admitted to hospital to 11/23/2022 and discharged on 12/02/2022 for a stroke. Patient did transition from inpatient to rehab then to home.   Requested that patient do DAPT for 3 weeks then asa alone life long. Currently still on Plavix and 81mg  asa  At rehab he had min to mod assistance with mild dysarthria and mild deficits in higer level tasks. He still endorses some weakness but much better since onset. Also has some sensation abnormalities that is improving   Has been home on since Friday States that he will be out of work for approx 6 months. States that the short term disability paperwork has already been filled out  States that he is doing exercises at home with the eleastic band. States that he is suppose to do out paitnet PT on Thursday along with OT  Patient denies visual abnormalities and he is right hand dominant   Review of Systems  Constitutional:  Negative for chills and fever.  Respiratory:  Negative for shortness of breath.   Cardiovascular:  Negative for chest pain.  Gastrointestinal:  Negative for abdominal pain, blood in stool, constipation, diarrhea, nausea and vomiting.  Neurological:  Positive for tingling and focal weakness. Negative for headaches.  Psychiatric/Behavioral:  Negative for hallucinations and suicidal ideas.         Objective:    BP 136/76   Pulse 85   Temp (!) 97.5 F (36.4 C)   Resp 16   Ht 5\' 11"  (1.803 m)   Wt 189 lb 2 oz (85.8 kg)   SpO2 98%   BMI 26.38 kg/m  BP Readings from Last 3 Encounters:  12/07/22 136/76  12/03/22 118/87  11/26/22 127/79   Wt Readings from Last 3 Encounters:  12/07/22 189 lb 2 oz (85.8 kg)  12/01/22 195 lb 1.7 oz (88.5 kg)  11/24/22 184 lb 8.4 oz (83.7 kg)       Physical Exam Vitals and nursing note reviewed.  Constitutional:      Appearance: Normal appearance.  HENT:     Right Ear: Tympanic membrane, ear canal and external ear normal.     Left Ear: Tympanic membrane, ear canal and external ear normal.     Mouth/Throat:     Mouth: Mucous membranes are moist.     Pharynx: Oropharynx is clear.  Eyes:     General: No visual field deficit.    Extraocular Movements: Extraocular movements intact.     Pupils: Pupils are equal, round, and reactive to light.  Cardiovascular:     Rate and Rhythm: Normal rate and regular rhythm.     Heart sounds: Normal heart sounds.  Pulmonary:     Effort: Pulmonary effort is normal.     Breath sounds: Normal breath sounds.  Musculoskeletal:     Right lower leg: No edema.     Left lower leg: No edema.  Lymphadenopathy:     Cervical: No cervical adenopathy.  Neurological:     Mental Status: He is alert.     Cranial Nerves: Dysarthria and facial asymmetry (slight left sided droop) present.     Motor: Weakness present.     Coordination: Finger-Nose-Finger Test abnormal (ataxia on LUE).  Gait: Gait abnormal.     Deep Tendon Reflexes:     Reflex Scores:      Bicep reflexes are 2+ on the right side and 2+ on the left side.      Patellar reflexes are 2+ on the right side and 2+ on the left side.    Comments: LUE 4/5 LLE 4/5 RUE 5/5 RLE 5/5     No results found for any visits on 12/07/22.      Assessment & Plan:   Problem List Items Addressed This Visit       Cardiovascular and Mediastinum   Acute CVA (cerebrovascular accident) (HCC)   Relevant Medications   atorvastatin (LIPITOR) 80 MG tablet   Other Relevant Orders   CBC   Comprehensive metabolic panel   Acute stroke of medulla oblongata (HCC) - Primary    Patient was admitted to the hospital with full stroke workup.  He was discharged from hospital and sent to rehab that was inpatient.  Patient is been home for approximately 5 days.   He does have PT OT scheduled does have a follow-up with neurology.  Blood work was unremarkable still waiting on 1 lab test result.      Relevant Medications   atorvastatin (LIPITOR) 80 MG tablet   Other Relevant Orders   CBC   Comprehensive metabolic panel     Digestive   GERD (gastroesophageal reflux disease)    Patient was on Nexium in the past this was sent to [facility] in the hospital.  He would like to continue refill provided today      Relevant Medications   pantoprazole (PROTONIX) 40 MG tablet     Nervous and Auditory   Seizure disorder [facility])    Patient currently maintained on lamotrigine and levetiracetam.  He is followed by Dr. [name] through [facility] neurology.  Has an appointment coming up in approximately 3 weeks for CVA follow-up.      Relevant Orders   CBC   Comprehensive metabolic panel   Oligodendroglioma of temporal lobe (HCC)    Historical diagnosis.  MRI of brain in the hospital shows unchanged temporal lobe mass.  Continue following neurology as recommended        Other   Hyponatremia    Noted finding in hospital setting pending electrolyte panel today      Relevant Orders   Comprehensive metabolic panel    Meds ordered this encounter  Medications   atorvastatin (LIPITOR) 80 MG tablet    Sig: Take 1 tablet (80 mg total) by mouth daily.    Dispense:  90 tablet    Refill:  0    Order Specific Question:   Supervising Provider    Answer:   Roxy Manns A [[id number]]   pantoprazole (PROTONIX) 40 MG tablet    Sig: Take 1 tablet (40 mg total) by mouth daily.    Dispense:  90 tablet    Refill:  0    Order Specific Question:   Supervising Provider    Answer:   Roxy Manns A [[id number]]    Return if symptoms worsen or fail to improve, for As scheduled .  Audria Nine, NP

## 2022-12-07 NOTE — Assessment & Plan Note (Signed)
Patient was on Nexium in the past this was sent to [facility] in the hospital.  He would like to continue refill provided today

## 2022-12-07 NOTE — Assessment & Plan Note (Signed)
Historical diagnosis.  MRI of brain in the hospital shows unchanged temporal lobe mass.  Continue following neurology as recommended

## 2022-12-07 NOTE — Patient Instructions (Signed)
Nice to see you today I will be in touch with the labs once I have reviewed them Follow up with neurology as scheduled Follow up with me as scheduled I have sent in refills for you

## 2022-12-07 NOTE — Therapy (Unsigned)
OUTPATIENT OCCUPATIONAL THERAPY NEURO EVALUATION  Patient Name: Corey Chang MRN: [id number] DOB:[birth year], 42 y.o., male Today's Date: 12/08/2022  PCP: Gweneth Dimitri, MD REFERRING PROVIDER: Charlton Amor, PA-C  END OF SESSION:  OT End of Session - 12/08/22 1050     Visit Number 1    Number of Visits 17    Date for OT Re-Evaluation 02/04/23    Authorization Type [employer] - Auth not Reqd    OT Start Time 1057    OT Stop Time 1157    OT Time Calculation (min) 60 min    Equipment Utilized During Biomedical engineer, Patient had L knee brace applied    Activity Tolerance Patient tolerated treatment well;No increased pain             Past Medical History:  Diagnosis Date   Allergic rhinitis 04/22/1997   Ankle fracture, right 07/2007   Cholelithiasis    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  He had external beam radiation in August 2001 with 54 Gy in 30 fractions on RTOG 9802 protocol.   He declined chemotherapy.    Seizure (HCC)    from history of oligodendroglioma   Past Surgical History:  Procedure Laterality Date   ANKLE SURGERY     right, x 3   brain biopsy  1997, 2001   Patient Active Problem List   Diagnosis Date Noted   Acute stroke of medulla oblongata (HCC) 11/26/2022   Hyponatremia 11/24/2022   Acute CVA (cerebrovascular accident) (HCC) 11/23/2022   Polycythemia 11/23/2022   Elevated LFTs 04/12/2022   Mixed hyperlipidemia 04/12/2022   Acute cough 04/12/2022   Syncope 04/03/2022   Elevated troponin 04/03/2022   Avascular necrosis of bone of hip, left (HCC) 04/03/2022   Allergic rhinitis 04/03/2022   Eustachian tube dysfunction, left 10/06/2021   Bloody diarrhea 11/17/2017   History of oligodendroglioma of brain 03/22/2017   Status post radiation therapy 03/22/2017   GERD (gastroesophageal reflux disease) 11/11/2016   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   Complex partial seizure (HCC) 12/21/2012    Seasonal allergies 07/08/2008   SMOKELESS TOBACCO ABUSE 08/02/2007   Intracranial tumor (HCC) 11/17/1999   Seizure disorder (HCC) 01/16/1997    ONSET DATE: Referral date: 12/06/2022   ONSET: 11/23/22  REFERRING DIAG: I63.9 (ICD-10-CM) - CVA (cerebral vascular accident)  THERAPY DIAG:  Other lack of coordination  Other disturbances of skin sensation  Other symptoms and signs involving the musculoskeletal system  Hemiplegia and hemiparesis following cerebral infarction affecting left non-dominant side (HCC)  Rationale for Evaluation and Treatment: Rehabilitation OT Discharge from Hospital states: "Patient will benefit from ongoing skilled OT services in outpatient setting to continue to advance functional skills in the area of BADL, iADL, Vocation, and Reduce care partner burden."   SUBJECTIVE:   SUBJECTIVE STATEMENT: Patient has experienced a few nose bleeds lately since being home (Friday after at hospital and this morning in the shower).  Returns to the DR next week for follow-up.  Patient reports noticing improvements everyday ie) with feeling and using L side.  Pt accompanied by: significant other - [name]  PERTINENT HISTORY:   42 yo male with history of oligodendroglioma s/p Postoperative radiotherapy (XRT), complex partial seizures, AVN left hip who was admitted to [facility] on 11/23/2022 with 1 day history of left-sided numbness with left lower extremity weakness followed by sudden onset of dizziness and diarrhea.  He was found to have acute infarct in medulla as well as unchanged  nonenhancing right temporal lobe mass.    Principal Problem:   Acute stroke of medulla oblongata (HCC) Active Problems:   Seizure disorder from history of oligodendroglioma   GERD (gastroesophageal reflux disease)   Allergic rhinitis Hyponatremia Polycythemia   Oligodendroglioma Left temporal; bx in 12/1999.  He had external beam radiation in August 2001 with 54 Gy in 30 fractions on RTOG 9802  protocol.   He declined chemotherapy.   Ankle surgery x 3   PRECAUTIONS: Fall and Other: currently not driving, h/o seizures (managed by medication)  WEIGHT BEARING RESTRICTIONS: No  PAIN:  Are you having pain? No  (Nagging L hip irritation due to Avascular Necrosis L hip - Ortho will not address it orthopaedically unless significant limitations in his function are noted and it becomes necessary)  FALLS: Has patient fallen in last 6 months? 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  Currently off work x 6 months, and Leisure: hunting, fishing, watching kids sports   PATIENT GOALS: To be able to do more FM stuff with his left hand, for things to be easier   OBJECTIVE:   HAND DOMINANCE: Right  ADLs:  "At inpatient rehab admission, patient required min to mod assist with basic ADL tasks and min assist with mobility. He exhibited mild dysarthria and mild deficits in higher level tasks. He has had improvement in activity tolerance, balance, postural control as well as ability to compensate for deficits. He is able to complete ADL tasks at modified independent level. He was independent for transfers and is able to ambulate 300' with use of LLE Swedish Knee cage independently in supervised setting and with supervision in community setting. His speech intelligibility has improved and he is able to utilize strategies for clarity as well as swallowing at modified independent level.  He is tolerating regular diet without any signs of aspiration."  Overall ADLs:  Patient reports MI with ADLs although buttons do get frustrating. Transfers/ambulation related to ADLs: MI without knee brace at home Eating: R handed so able to hold fork in L hand to cut food with R hand Grooming: electric razor/trimmer UB Dressing: MI with some assistance for buttons LB Dressing: MI - managed jeans button/zipper and belt today Toileting: MI Bathing: MI - showers in standing generally, he did sit on shower seat today when he got a nose bleed Tub Shower transfers: walk in shower Equipment: Shower seat with back  IADLs:            Shopping: currently relying on spouse Light housekeeping: currently relying on spouse due to standing for long periods of time being difficult due to L hip discomfort Meal Prep: currently relying on spouse - did not have activity tolerance this morning to try and cook eggs Community mobility: depends on spouse - RE: Driving - MD told him to wait 2 weeks and then [place] before resuming driving Medication management: wife puts them in a medicine box due to coordination difficulties Financial management: shares with spouse - mostly via phone Handwriting: 100% legible - R handed  MOBILITY  STATUS: Independent and wears knee brace for long distance but not in the house  POSTURE COMMENTS:  No Significant postural limitations Sitting balance:  WFL  ACTIVITY TOLERANCE: Activity tolerance: LIMITED -- Had cleaned off the stove and washed something to make eggs this morning but needed to sit and rest.  FUNCTIONAL OUTCOME MEASURES: Upper Extremity Functional Scale (UEFS): 68 as rated by patient and 48 by spouse  0 to 80 score range of the UEFS -- Lower scores indicate that the subject is reporting increased difficulty with the activities as a result of their upper limb condition. While higher scores indicate less severity.   FOTO Severity: Slight (Intake FS: 60)    UPPER EXTREMITY ROM:    A/ROM - WNL L UE moves  slower than R UE but achieves same ROM  UPPER EXTREMITY MMT:     R UE - 5/5 L UE - 4 to 4+/5  HAND FUNCTION: Grip strength: Right: 166.8 lbs; Left: 101.1  lbs, Lateral pinch: Right: 30 lbs, Left: 24 lbs, and 3 point pinch: Right: 30 lbs, Left: 20 lbs Hospital DC last week:  R Dominant Hand:  Average: 169.33 lbs L Nondominant (hemiparetic) Hand: Average: 91.67 lbs  COORDINATION: 9 Hole Peg test: Right: 33.94  sec; Left: 102.15 sec Box and Blocks:  Right 46 blocks, Left 27 blocks Used R hand to help L hand on occasion  SENSATION: Light touch: Impaired  Hot/Cold: Impaired   Patient was able to feel light touch on all fingers at least 1/3 times but had difficulty with median nerve distribution with lightest touch and would benefit from more formal testing.  He self reports hot and cold not being as distinct on his L side but noting improvements everyday.   EDEMA: No swelling  MUSCLE TONE: WFL  COGNITION: Overall cognitive status: Within functional limits for tasks assessed  VISION: Subjective report: No issues Baseline vision: No visual deficits Visual history:  N/A  VISION ASSESSMENT: WFL  PERCEPTION: WFL  PRAXIS: WFL  OBSERVATIONS: Patient arrives today with his wife with L knee brace in place and ambulates without AE from lobby to therapy gym.   No loss of balance noted during mobility tasks during short observations of mobility in clinic. He demonstrates full ROM of BUE although L UE moves at a slower rate than R UE and has obvious coordination deficits as noted by time to compete 9 hole peg test at 3x the time for R UE.     TODAY'S TREATMENT:                                                                                                                              DATE:    No treatment provided today   PATIENT EDUCATION: Education details: OT POC and treatment plan with ideas of things to begin at home Person educated: Patient and Spouse Education method:  Explanation and Verbal cues Education comprehension: verbalized understanding and needs further education  HOME EXERCISE PROGRAM: TBD - High Level Coordination tasks, sustained activity tolerance   GOALS: Goals reviewed with patient? Yes - generally reviewed with specific goals to be reviewed at next visit    SHORT TERM GOALS: Target date: 01/05/23  Will time buttoning activity to establish goal for improved coordination for timely buttoning for MI at home without spouse assisting him and improved score on UEFI from little difficulty to no difficulty.  Baseline: Patient and spouse report difficulty with multiple small buttons Goal status: INITIAL  2.  Patient will complete nine-hole peg with use of L UE in less than 1 minute.  Baseline: 102 seconds Goal status: INITIAL  3.  Pt will be able to place at least 32 blocks using left hand with completion of Box and Blocks test.  Baseline: 27 blocks Goal status: INITIAL  4.  Will complete more extensive sensory testing (2 pt discrimination and stereognosis) to determine full sensory deficits of L UE. Baseline: Observation of some impairment in light touch of volar surface of fingertips and self reported decreased awareness of hot/cold. Goal status: INITIAL  5.  Patient will report no difficulty with tasks from UEFI test including lifting grocery bags, preparing food, and helping with IADLS in standing for improved UEFI score x 5 points. Baseline: Patient 68/80 & Spouse 48/80 Goal status: INITIAL  6.  Patient will have increased activity tolerance for standing 15 minutes while moving heavy objects (15+ pounds) to simulate work tasks. Baseline: Patient had to sit after getting dishes ready to make eggs this morning Goal status: INITIAL  LONG TERM GOALS: Target date: 02/04/23  Patient will complete nine-hole peg with use of L in 45 seconds or less.  Baseline: 102 seconds Goal status: INITIAL  2.  Pt will be able to place at least >35  blocks using left hand with completion of Box and Blocks test.  Baseline: 27 blocks Goal status: INITIAL  3.  Patient will report no difficulty with tasks from UEFI test for score of > 75/80 for minimal residual deficits/difficulties. aseline: Patient 68/80 & Spouse 48/80 Goal status: INITIAL  4.  Patient will identify 3 compensatory techniques to be MI with any sensory compensatory techniques for any residual deficits to minimize risk of self-injury. Baseline: Minor light touch deficits and temperature deficits Goal status: INITIAL  5.  Patient will be MI with all HEPs for high level coordination and sustained activity tolerance. Baseline: Aware of some exercises from hospital  Goal status: INITIAL   6.  Patient will have increased activity tolerance for standing 30 minutes while moving heavy objects (30+ pounds) to simulate work tasks and/or complete chores at home as appropriate (laundry/meal prep etc) Baseline:  Goal status: INITIAL  ASSESSMENT:  CLINICAL IMPRESSION: Patient is a pleasant, cooperative father of 2, who presented today with his spouse for evaluation s/p recent CVA with L UE hemiparesis manifesting in coordination deficits rather than strength deficits although activity tolerance limits sustained strength and participation in ADLs/IADLS. Spouse has increased perception of deficits compared to patient as noted by patient reporting "little difficulty" with tasks compared to her perception of "moderate difficulty" - especially compared to PLOF.   PERFORMANCE DEFICITS: in functional skills including ADLs, IADLs, coordination, dexterity, proprioception, sensation, Fine motor control, endurance, and UE functional use,   IMPAIRMENTS: are limiting patient from ADLs, IADLs, work, leisure, and social participation.   CO-MORBIDITIES: has co-morbidities such as h.o seizures and   unchanged nonenhancing right temporal lobe mass.that affects occupational performance. Patient will  benefit from skilled OT to address above impairments and improve overall function.  MODIFICATION OR ASSISTANCE TO COMPLETE EVALUATION: Min-Moderate modification of tasks or assist with assess necessary to complete an evaluation.  OT OCCUPATIONAL PROFILE AND HISTORY: Detailed assessment: Review of records and additional review of physical, cognitive, psychosocial history related to current functional performance.  CLINICAL DECISION MAKING: Moderate - several treatment options, min-mod task modification necessary  REHAB POTENTIAL: Excellent  EVALUATION COMPLEXITY: Moderate    PLAN:  OT FREQUENCY: 1-2x/week  OT DURATION: 8 weeks  PLANNED INTERVENTIONS: self care/ADL training, therapeutic exercise, therapeutic activity, neuromuscular re-education, functional mobility training, patient/family education, energy conservation, coping strategies training, and DME and/or AE instructions  RECOMMENDED OTHER SERVICES: Patient scheduled for PT/ST evaluations on Friday  CONSULTED AND AGREED WITH PLAN OF CARE: Patient and family member/caregiver  PLAN FOR NEXT SESSION: Review established goals, initiate HEP - high level coordination tasks and begin progression of activity tolerance activities.  Time buttoning task to update goal. Sensory testing to establish deficits and update goal.   Victorino Sparrow, OT 12/08/2022, 2:36 PM

## 2022-12-07 NOTE — Assessment & Plan Note (Signed)
Noted finding in hospital setting pending electrolyte panel today

## 2022-12-07 NOTE — Assessment & Plan Note (Signed)
Patient was admitted to the hospital with full stroke workup.  He was discharged from hospital and sent to rehab that was inpatient.  Patient is been home for approximately 5 days.  He does have PT OT scheduled does have a follow-up with neurology.  Blood work was unremarkable still waiting on 1 lab test result.

## 2022-12-08 ENCOUNTER — Encounter: Payer: Self-pay | Admitting: Occupational Therapy

## 2022-12-08 ENCOUNTER — Other Ambulatory Visit: Payer: Self-pay

## 2022-12-08 ENCOUNTER — Ambulatory Visit: Payer: BC Managed Care – PPO | Attending: Physician Assistant | Admitting: Occupational Therapy

## 2022-12-08 DIAGNOSIS — R29898 Other symptoms and signs involving the musculoskeletal system: Secondary | ICD-10-CM | POA: Diagnosis present

## 2022-12-08 DIAGNOSIS — R41841 Cognitive communication deficit: Secondary | ICD-10-CM | POA: Diagnosis present

## 2022-12-08 DIAGNOSIS — R2689 Other abnormalities of gait and mobility: Secondary | ICD-10-CM | POA: Diagnosis present

## 2022-12-08 DIAGNOSIS — I69354 Hemiplegia and hemiparesis following cerebral infarction affecting left non-dominant side: Secondary | ICD-10-CM | POA: Insufficient documentation

## 2022-12-08 DIAGNOSIS — M6281 Muscle weakness (generalized): Secondary | ICD-10-CM | POA: Insufficient documentation

## 2022-12-08 DIAGNOSIS — R208 Other disturbances of skin sensation: Secondary | ICD-10-CM | POA: Diagnosis present

## 2022-12-08 DIAGNOSIS — R278 Other lack of coordination: Secondary | ICD-10-CM | POA: Diagnosis present

## 2022-12-10 ENCOUNTER — Other Ambulatory Visit: Payer: Self-pay

## 2022-12-10 ENCOUNTER — Encounter: Payer: Self-pay | Admitting: Physical Therapy

## 2022-12-10 ENCOUNTER — Encounter: Payer: Self-pay | Admitting: Speech Pathology

## 2022-12-10 ENCOUNTER — Ambulatory Visit: Payer: BC Managed Care – PPO | Admitting: Physical Therapy

## 2022-12-10 ENCOUNTER — Ambulatory Visit: Payer: BC Managed Care – PPO | Admitting: Speech Pathology

## 2022-12-10 VITALS — BP 121/75 | HR 75

## 2022-12-10 DIAGNOSIS — M6281 Muscle weakness (generalized): Secondary | ICD-10-CM

## 2022-12-10 DIAGNOSIS — R278 Other lack of coordination: Secondary | ICD-10-CM | POA: Diagnosis not present

## 2022-12-10 DIAGNOSIS — I69354 Hemiplegia and hemiparesis following cerebral infarction affecting left non-dominant side: Secondary | ICD-10-CM

## 2022-12-10 DIAGNOSIS — R2689 Other abnormalities of gait and mobility: Secondary | ICD-10-CM

## 2022-12-10 DIAGNOSIS — R208 Other disturbances of skin sensation: Secondary | ICD-10-CM

## 2022-12-10 DIAGNOSIS — R41841 Cognitive communication deficit: Secondary | ICD-10-CM

## 2022-12-10 NOTE — Therapy (Unsigned)
OUTPATIENT PHYSICAL THERAPY NEURO EVALUATION   Patient Name: Corey Chang MRN: [id number] DOB:1980/07/28, 42 y.o., male Today's Date: 12/10/2022   PCP: Corey Emms, MD REFERRING PROVIDER: Charlton Amor, PA-C  END OF SESSION:  PT End of Session - 12/10/22 1030     Visit Number 1    Number of Visits 9   8 + eval   Authorization Type [employer]    PT Start Time 1021    PT Stop Time 1104    PT Time Calculation (min) 43 min    Equipment Utilized During Treatment Gait belt;Left knee immobilizer    Behavior During Therapy WFL for tasks assessed/performed;Impulsive             Past Medical History:  Diagnosis Date   Allergic rhinitis 04/22/1997   Ankle fracture, right 07/2007   Cholelithiasis    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  He had external beam radiation in August 2001 with 54 Gy in 30 fractions on RTOG 9802 protocol.   He declined chemotherapy.    Seizure (HCC)    from history of oligodendroglioma   Past Surgical History:  Procedure Laterality Date   ANKLE SURGERY     right, x 3   brain biopsy  1997, 2001   Patient Active Problem List   Diagnosis Date Noted   Acute stroke of medulla oblongata (HCC) 11/26/2022   Hyponatremia 11/24/2022   Acute CVA (cerebrovascular accident) (HCC) 11/23/2022   Polycythemia 11/23/2022   Elevated LFTs 04/12/2022   Mixed hyperlipidemia 04/12/2022   Acute cough 04/12/2022   Syncope 04/03/2022   Elevated troponin 04/03/2022   Avascular necrosis of bone of hip, left (HCC) 04/03/2022   Allergic rhinitis 04/03/2022   Eustachian tube dysfunction, left 10/06/2021   Bloody diarrhea 11/17/2017   History of oligodendroglioma of brain 03/22/2017   Status post radiation therapy 03/22/2017   GERD (gastroesophageal reflux disease) 11/11/2016   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   Complex partial seizure (HCC) 12/21/2012   Seasonal allergies 07/08/2008   SMOKELESS TOBACCO  ABUSE 08/02/2007   Intracranial tumor (HCC) 11/17/1999   Seizure disorder (HCC) 01/16/1997    ONSET DATE: 11/26/2022 (CVA)  REFERRING DIAG: I63.9 (ICD-10-CM) - Cerebral infarction, unspecified  THERAPY DIAG:  Other lack of coordination  Other disturbances of skin sensation  Hemiplegia and hemiparesis following cerebral infarction affecting left non-dominant side (HCC)  Muscle weakness (generalized)  Other abnormalities of gait and mobility  Rationale for Evaluation and Treatment: Rehabilitation  SUBJECTIVE:  SUBJECTIVE STATEMENT: "I'm still a little tingly on the left side and because of that it's hard to tell where my arm and leg are and what they are doing.  My knee was buckling backwards, but seems to be getting better.  I am still having issues grasping objects, but even this has progressed.  I can't just jump up and go, I have to stand and let the blood flow before.  I think it's more of a sensitivity issue versus my strength." Pt accompanied by: significant other-wife, Corey Chang; pt states he should be cleared to return to driving in the next week  PERTINENT HISTORY: L hip AVN (ortho not planning to address unless significant limitations in function-followed by [facility]), ankle ORIF x3, oligodendroglioma s/p Postoperative radiotherapy (XRT), complex partial seizures  Right hand dominant male who was admitted to [facility] on 11/23/2022 with 1 day history of left-sided numbness with left lower extremity weakness followed by sudden onset of dizziness and diarrhea.  He was found to have acute infarct in medulla.  PAIN:  Are you having pain? 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 2. Unchanged nonenhancing right temporal lobe mass, previously reported to represent an oligodendroglioma.  COGNITION: Overall cognitive status: Within functional limits for tasks assessed   SENSATION: Light touch: Impaired ; left side feels more dull compared to right  COORDINATION: LE RAMS:  dysmetric Heel-to-shin:  limited by L Hip AVN  EDEMA:  None noted in BLE  MUSCLE TONE: None noted during functional assessment  POSTURE: No Significant postural limitations  LOWER EXTREMITY ROM:     Active  Right Eval Left Eval  Hip flexion Grossly WFL  Hip extension   Hip abduction   Hip adduction   Hip internal rotation   Hip external rotation   Knee flexion   Knee extension   Ankle dorsiflexion   Ankle plantarflexion    Ankle inversion    Ankle eversion     (Blank rows = not tested)  LOWER EXTREMITY MMT:    MMT Right Eval Left Eval  Hip flexion 5/5 4+/5  Hip extension    Hip abduction " "  Hip adduction " "  Hip internal rotation    Hip external rotation    Knee flexion " "  Knee extension " "   Ankle dorsiflexion " "  Ankle plantarflexion    Ankle inversion    Ankle eversion    (Blank rows = not tested)  BED MOBILITY:  Sit to supine Complete Independence Supine to sit  Complete Independence Rolling to Right Complete Independence Rolling to Left Complete Independence  TRANSFERS: Assistive device utilized: None  Sit to stand: Complete Independence Stand to sit: Complete Independence Chair to chair: Complete Independence  GAIT: Gait pattern: decreased hip/knee flexion- Left and genu recurvatum- Left Distance walked: various clinic distances Assistive device utilized: None Level of assistance: SBA Comments: Pt has one instance of left LE buckling stating his hip was catching and hurting.  FUNCTIONAL TESTS:  5 times sit to stand: 6.72 seconds no UE support, intermittent touch of back of legs to chair, no LOB 6 minute walk test: To be assessed. Functional gait assessment: To be assessed.  PATIENT SURVEYS:  FOTO not captured at intake.  TODAY'S TREATMENT:                                                                                                                              DATE: N/A    PATIENT EDUCATION: Education details: PT POC, assessments used and to be used, and goals to be set. Person educated: Patient Education method: Explanation Education comprehension: verbalized understanding  HOME EXERCISE PROGRAM: To be established.  GOALS: Goals reviewed with patient? Yes  SHORT TERM GOALS: Target date: ***  *** Baseline: Goal status: {GOALSTATUS:25110}  2.  *** Baseline:  Goal status: {GOALSTATUS:25110}  3.  *** Baseline:  Goal status: {GOALSTATUS:25110}  4.  *** Baseline:  Goal status: {GOALSTATUS:25110}  5.  *** Baseline:  Goal status: {GOALSTATUS:25110}  6.  *** Baseline:  Goal status: {GOALSTATUS:25110}  LONG TERM GOALS: Target date: ***  *** Baseline:  Goal status: {GOALSTATUS:25110}  2.  *** Baseline:  Goal status:  {GOALSTATUS:25110}  3.  *** Baseline:  Goal status: {GOALSTATUS:25110}  4.  *** Baseline:  Goal status: {GOALSTATUS:25110}  5.  *** Baseline:  Goal status: {GOALSTATUS:25110}  6.  *** Baseline:  Goal status: {GOALSTATUS:25110}  ASSESSMENT:  CLINICAL IMPRESSION: Patient is a *** y.o. *** who was seen today for physical therapy evaluation and treatment for ***.  Pt has a significant PMH of ***.  Identified impairments include ***.  Evaluation via the following assessment tools: *** indicate fall risk.  They would benefit from skilled PT to address impairments as noted and progress towards long term goals.  OBJECTIVE IMPAIRMENTS: {opptimpairments:25111}.   ACTIVITY LIMITATIONS: {activitylimitations:27494}  PARTICIPATION LIMITATIONS: {participationrestrictions:25113}  PERSONAL FACTORS: {Personal factors:25162} are also affecting patient's functional outcome.   REHAB POTENTIAL: {rehabpotential:25112}  CLINICAL DECISION MAKING: {clinical decision making:25114}  EVALUATION COMPLEXITY: {Evaluation complexity:25115}  PLAN:  PT FREQUENCY: 1x/week  PT DURATION: 8 weeks  PLANNED INTERVENTIONS: {rehab planned interventions:25118::"Therapeutic exercises","Therapeutic activity","Neuromuscular re-education","Balance training","Gait training","Patient/Family education","Self Care","Joint mobilization"}  PLAN FOR NEXT SESSION: *** and FGA-set goals, initiate HEP for L NMR/strength   Sadie Haber, PT, DPT 12/10/2022, 11:05 AM

## 2022-12-10 NOTE — Therapy (Signed)
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  He had external beam radiation in August 2001 with 54 Gy in 30 fractions on RTOG 9802 protocol.   He declined chemotherapy.    Seizure (HCC)    from history of oligodendroglioma   Past Surgical History:  Procedure Laterality Date   ANKLE SURGERY     right, x 3   brain biopsy  1997, 2001   Patient Active Problem List   Diagnosis Date Noted   Acute stroke of medulla oblongata (HCC) 11/26/2022   Hyponatremia 11/24/2022   Acute CVA (cerebrovascular accident) (HCC) 11/23/2022   Polycythemia 11/23/2022   Elevated LFTs 04/12/2022   Mixed hyperlipidemia 04/12/2022   Acute cough 04/12/2022   Syncope 04/03/2022   Elevated troponin 04/03/2022   Avascular necrosis of bone of hip, left (HCC) 04/03/2022   Allergic rhinitis 04/03/2022   Eustachian tube dysfunction, left 10/06/2021   Bloody diarrhea 11/17/2017   History of oligodendroglioma of brain 03/22/2017   Status post radiation therapy 03/22/2017   GERD (gastroesophageal reflux disease) 11/11/2016   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  Rationale for Evaluation and Treatment: Rehabilitation  SUBJECTIVE:   SUBJECTIVE STATEMENT: "Doing better and better every day" Pt accompanied by: significant other  PERTINENT HISTORY:  Pt was seen in am to address cognitive re- training, dysphagia management, and speech intelligibility. Pt was alert and seated upright in recliner upon SLP arrival. SLP reviewed speech intelligibility strategies including; over articulation and separating words. Pt was subsequently engaged in a spontaneous conversation with pt utilizing strategies with sup A. SLP further reviewed strategies of limiting distractions during higher level tasks (I.e. finances and medication management) and double checking work for accuracy. Pt verbalized understanding. SLP also reviewed safe swallowing strategies of oral hygiene BID, upright positioning during and after meals, slow rate and small single boluses/ sips. Pt able to teach back information with 100% acc. Pt left seated upright in recliner with call button within reach. SLP to continue POC    PAIN:  Are you having pain? No  FALLS: Has patient fallen in last 6 months?  No  LIVING ENVIRONMENT: Lives with: lives with their spouse Lives in: House/apartment  PLOF:  Level of assistance: Independent with ADLs, Independent with IADLs Employment: Full-time employment   OBJECTIVE:   PATIENT REPORT: Patient noted no concerns with cognitive functioning, word finding, executive functioning. Stated that limitations are physical and not mental. Notes from inpatient SLP state quick mastery of speech goals, and patient demonstrates carry over  of those goals within everyday life. Patient and spouse state no concerns with daily living and household management.   COGNITION: Overall cognitive status: Within functional limits for tasks assessed Comments: Reviewed SLP progress notes, patient rating scale, patient interview.  MOTOR SPEECH: assessed across  variety of speech tasks: reading, word repetition, generative discourse sample Overall motor speech: Appears intact Rate of Speech: WFL Dysfluencies: none evidenced Phonation: normal Voice Quality: normal Respiration: thoracic breathing Word and Phrasal Stress: WFL Resonance: WFL Articulation: Appears intact Diadochokinetic Rate (DDK): unremarkable Intelligibility: Intelligible  STANDARDIZED ASSESSMENTS: Cognitive Function patient rating scale: all items rated as "never"    PATIENT REPORTED OUTCOME MEASURES (PROM): Cognitive Function: Patient rating scales as never. Only concerned with remembering names but that was demonstrated as a weakness before stroke.    TODAY'S TREATMENT:                                                                                                                                         DATE:   12/10/22: Education on role of SLP role in outpatient rehab. Assessments given of patient rating scale, and patient interview. No concern noted by the patient with cognitive function, motor speech or swallowing. All cognitive functioning see to have returned to baseline pre-stroke.Speech therapy not recommended at this time due to results from cognitive function patient rating scale. Recommendation endorsed by patient and spouse.    PATIENT EDUCATION: Education details: See above  Person educated: Patient and Spouse Education method: Explanation Education comprehension: verbalized understanding   ASSESSMENT:  CLINICAL IMPRESSION: Patient is a 42 y.o. M who was seen today for cognitive communication, motor speech, and swallowing evaluation. All domains demonstrated within functional limits. Cognitive function rating scales via PROM rated as never, showing no concern. Swallowing is noted to be within functional limits, with no evidence of aspiration, no cough after eating or drinking, and no modifications to diet as per patient report. Motor speech is noted to be  clear and intelligible without overt differences endorsed by pt or spouse. Skilled ST is not indicated at this time.      PLAN:   FREQUENCY: one time visit  [facility], Student-SLP 12/10/2022, 9:31 AM

## 2022-12-14 LAB — PROTHROMBIN GENE MUTATION

## 2022-12-15 ENCOUNTER — Ambulatory Visit: Payer: BC Managed Care – PPO | Admitting: Occupational Therapy

## 2022-12-15 ENCOUNTER — Ambulatory Visit: Payer: BC Managed Care – PPO | Admitting: Physical Therapy

## 2022-12-15 DIAGNOSIS — R2689 Other abnormalities of gait and mobility: Secondary | ICD-10-CM

## 2022-12-15 DIAGNOSIS — M6281 Muscle weakness (generalized): Secondary | ICD-10-CM

## 2022-12-15 DIAGNOSIS — R208 Other disturbances of skin sensation: Secondary | ICD-10-CM

## 2022-12-15 DIAGNOSIS — I69354 Hemiplegia and hemiparesis following cerebral infarction affecting left non-dominant side: Secondary | ICD-10-CM

## 2022-12-15 DIAGNOSIS — R278 Other lack of coordination: Secondary | ICD-10-CM | POA: Diagnosis not present

## 2022-12-15 LAB — FACTOR 5 LEIDEN

## 2022-12-15 NOTE — Therapy (Signed)
OUTPATIENT PHYSICAL THERAPY NEURO TREATMENT   Patient Name: Corey Chang MRN: [id number] DOB:[birth year], 42 y.o., male Today's Date: 12/15/2022   PCP: Eden Emms, MD REFERRING PROVIDER: Charlton Amor, PA-C  END OF SESSION:  PT End of Session - 12/15/22 0849     Visit Number 2    Number of Visits 9   8 + eval   Date for PT Re-Evaluation 02/11/23   pushed out to accomodate scheduling w/ OT   Authorization Type [employer]    PT Start Time 0848   from OT session   PT Stop Time 0932    PT Time Calculation (min) 44 min    Equipment Utilized During Treatment Gait belt   Left knee cage   Activity Tolerance Patient tolerated treatment well    Behavior During Therapy [facility] for tasks assessed/performed;Impulsive             Past Medical History:  Diagnosis Date   Allergic rhinitis 04/22/1997   Ankle fracture, right 07/2007   Cholelithiasis    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  He had external beam radiation in August 2001 with 54 Gy in 30 fractions on RTOG 9802 protocol.   He declined chemotherapy.    Seizure (HCC)    from history of oligodendroglioma   Past Surgical History:  Procedure Laterality Date   ANKLE SURGERY     right, x 3   brain biopsy  1997, 2001   Patient Active Problem List   Diagnosis Date Noted   Acute stroke of medulla oblongata (HCC) 11/26/2022   Hyponatremia 11/24/2022   Acute CVA (cerebrovascular accident) (HCC) 11/23/2022   Polycythemia 11/23/2022   Elevated LFTs 04/12/2022   Mixed hyperlipidemia 04/12/2022   Acute cough 04/12/2022   Syncope 04/03/2022   Elevated troponin 04/03/2022   Avascular necrosis of bone of hip, left (HCC) 04/03/2022   Allergic rhinitis 04/03/2022   Eustachian tube dysfunction, left 10/06/2021   Bloody diarrhea 11/17/2017   History of oligodendroglioma of brain 03/22/2017   Status post radiation therapy 03/22/2017   GERD (gastroesophageal reflux disease) 11/11/2016    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 Pt reports nagging pain in his L hip, has been bothering him since September, went to orthopedic specialist and was told his cartilage is gone. Pt reports that the plan is no surgery on the hip until the pain becomes debiliating. Pt reports that his doctor told him he can resume driving 2 week after d/c from the hospital.  Pt accompanied by: significant other-wife, [name]; pt states he should be cleared to return to driving in the next week  PERTINENT HISTORY: L hip AVN (ortho not planning to address unless significant limitations in function-followed by [facility]), ankle ORIF x3, oligodendroglioma s/p Postoperative radiotherapy (XRT), complex partial seizures  Right hand dominant male who was admitted to [facility] on 11/23/2022 with 1 day history of left-sided numbness with left lower extremity weakness followed by sudden onset of dizziness and diarrhea.  He was found to have acute infarct in medulla.  PAIN:  Are you having pain? 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 2. Unchanged nonenhancing right temporal lobe mass, previously reported to represent an oligodendroglioma.  COGNITION: Overall cognitive status: Within functional limits for tasks assessed   SENSATION: Light touch: Impaired ; left side feels more dull compared to right  COORDINATION: LE RAMS:  dysmetric Heel-to-shin:  limited by L Hip AVN  EDEMA:  None noted in BLE  MUSCLE TONE: None noted during functional assessment  POSTURE: No Significant postural limitations  LOWER EXTREMITY ROM:     Active  Right Eval Left Eval  Hip flexion Grossly WFL  Hip extension   Hip abduction   Hip adduction   Hip internal rotation   Hip external rotation   Knee flexion   Knee extension   Ankle dorsiflexion   Ankle plantarflexion    Ankle inversion    Ankle eversion     (Blank rows = not tested)  LOWER EXTREMITY MMT:    MMT Right Eval Left Eval  Hip flexion 5/5 4+/5  Hip extension    Hip abduction " "  Hip adduction " "  Hip internal rotation    Hip external rotation    Knee flexion " "  Knee  extension " "  Ankle dorsiflexion " "  Ankle plantarflexion    Ankle inversion    Ankle eversion    (Blank rows = not tested)  BED MOBILITY:  Sit to supine Complete Independence Supine to sit Complete Independence Rolling to Right Complete Independence Rolling to  Left Complete Independence  TRANSFERS: Assistive device utilized: None  Sit to stand: Complete Independence Stand to sit: Complete Independence Chair to chair: Complete Independence  GAIT: Gait pattern: decreased hip/knee flexion- Left and genu recurvatum- Left Distance walked: various clinic distances Assistive device utilized: None Level of assistance: SBA Comments: Pt has one instance of left LE buckling stating his hip was catching and hurting.  FUNCTIONAL TESTS:  5 times sit to stand: 6.72 seconds no UE support, intermittent touch of back of legs to chair, no LOB 6 minute walk test: To be assessed. Functional gait assessment: To be assessed.  PATIENT SURVEYS:  FOTO not captured at intake.  TODAY'S TREATMENT:                                                                                                                              TherEx Standing LLE TKE with GTB 3 x 15 reps with cues for slow,controlled movements Squats with focus on L quad control, needs mirror to maintain midline as well as min to mod manual and tactile cueing, 3 x 10 reps Staggered sit to stand x 15 reps  Added sit to stands to HEP, pt needs more practice with performing exercises in slow, controlled manner before adding others to HEP  TherAct  [facility] [place] PT Assessment - 12/15/22 0854       6 minute walk test results    Aerobic Endurance Distance Walked 1114      Functional Gait  Assessment   Gait assessed  Yes    Gait Level Surface Walks 20 ft in less than 7 sec but greater than 5.5 sec, uses assistive device, slower speed, mild gait deviations, or deviates 6-10 in outside of the 12 in walkway width.    Change in Gait Speed Able to  smoothly change walking speed without loss of balance or gait deviation. Deviate no more than 6 in outside of the 12 in walkway width.    Gait with Horizontal Head Turns Performs head turns with moderate changes in gait velocity, slows down, deviates 10-15 in outside 12 in walkway width but recovers, can continue to walk.    Gait with Vertical Head Turns Performs task with slight change in gait velocity (eg, minor disruption to smooth gait path), deviates 6 - 10 in outside 12 in walkway width or uses assistive device    Gait and Pivot Turn Pivot turns safely within 3 sec and stops quickly with no loss of balance.    Step Over Obstacle Is able to step over 2 stacked shoe boxes taped together (9 in total height) without changing gait speed. No evidence of imbalance.    Gait with Narrow Base of Support Ambulates 4-7 steps.    Gait with Eyes Closed Walks 20 ft, uses assistive device, slower speed, mild gait deviations, deviates 6-10 in outside 12 in walkway width. Ambulates 20 ft in less than 9 sec but greater than 7 sec.  Ambulating Backwards Walks 20 ft, uses assistive device, slower speed, mild gait deviations, deviates 6-10 in outside 12 in walkway width.    Steps Two feet to a stair, must use rail.    Total Score 20    FGA comment: 20/30, medium fall risk            : 1114 ft; onset of L knee genu recurvatum and decreased hip flexion with onset of fatigue; pt not wearing knee cage this session   PATIENT EDUCATION: Education details: initiated HEP, results of FGA and and functional implications Person educated: Patient Education method: Explanation and Handouts Education comprehension: verbalized understanding  HOME EXERCISE PROGRAM: Access Code: [email or web address] URL: [email or web address] Date: 12/15/2022 Prepared by: Peter Congo  Exercises - Staggered Sit-to-Stand  - 1 x daily - 7 x weekly - 3 sets - 10 reps  GOALS: Goals reviewed with patient? Yes  SHORT  TERM GOALS: Target date: 01/07/2023  Pt will ambulate greater than or equal to 1300 feet on with LRAD and mod I for improved cardiovascular endurance and BLE strength.  Baseline:  1114 ft (5/29) Goal status: INITIAL  2.  Pt will improve FGA to 23/30 for decreased fall risk  Baseline: 20/30 (5/29) Goal status: INITIAL  LONG TERM GOALS: Target date: 02/04/2023  Pt will be independent with strength and balance HEP to improve mobility and functional independence. Baseline: To be established. Goal status: INITIAL  2.  Pt will ambulate greater than or equal to 1500 feet on with LRAD and mod I for improved cardiovascular endurance and BLE strength.  Baseline: 1114 ft (5/29) Goal status: INITIAL  3.  Pt will improve FGA to 26/30 for decreased fall risk  Baseline: 20/30 (5/29) Goal status: INITIAL  4.  Pt will ambulate >/=500 feet with LRAD over unlevel indoor and outdoor surfaces demonstrating improved left knee control at no more than modI level of assist to promote household and community access. Baseline: various level clinic distances w/ left genu recurvatum wearing left knee cage Goal status: INITIAL  ASSESSMENT:  CLINICAL IMPRESSION: Emphasis of skilled PT session on assessing and FGA for STG and LTG creation in order to further assess balance and endurance. Pt exhibits onset of L knee genu recurvatum and decreased hip flexion with onset of fatigue but does exhibit good overall endurance with test. Pt does exhibit impaired balance and increased fall risk as evidenced by score of 20/30 on the FGA. Pt continues to exhibit decreased LLE strength and control and continues to benefit from skilled therapy services to work towards LTGs. Continue POC.   OBJECTIVE IMPAIRMENTS: Abnormal gait, decreased activity tolerance, decreased balance, decreased coordination, decreased endurance, decreased knowledge of use of DME, difficulty walking, decreased strength, and impaired sensation.    ACTIVITY LIMITATIONS: carrying, lifting, squatting, and locomotion level  PARTICIPATION LIMITATIONS: driving, community activity, and occupation  PERSONAL FACTORS: Fitness, Past/current experiences, Profession, Sex, Transportation, and 1-2 comorbidities: Left hip AVN and right oligodendroglioma  are also affecting patient's functional outcome.   REHAB POTENTIAL: Excellent  CLINICAL DECISION MAKING: Evolving/moderate complexity  EVALUATION COMPLEXITY: Moderate  PLAN:  PT FREQUENCY: 1x/week  PT DURATION: 8 weeks  PLANNED INTERVENTIONS: Therapeutic exercises, Therapeutic activity, Neuromuscular re-education, Balance training, Gait training, Patient/Family education, Self Care, Stair training, Vestibular training, DME instructions, Manual therapy, and Re-evaluation  PLAN FOR NEXT SESSION: add to HEP for L NMR/strength, coordination tasks, L quad strengthening (keep working on TKE, squats), step ups, eccentric step downs   Peter Congo,  PT, DPT, CSRS 12/15/2022, 9:32 AM

## 2022-12-15 NOTE — Therapy (Signed)
OUTPATIENT OCCUPATIONAL THERAPY NEURO TREATMENT  Patient Name: Corey Chang MRN: [id number] DOB:1980-09-11, 42 y.o., male Today's Date: 12/15/2022  PCP: Gweneth Dimitri, MD REFERRING PROVIDER: Charlton Amor, PA-C  END OF SESSION:  OT End of Session - 12/15/22 0758     Visit Number 2    Number of Visits 17    Date for OT Re-Evaluation 02/04/23    Authorization Type [employer] - Auth not Reqd    OT Start Time 0800    OT Stop Time 0845    OT Time Calculation (min) 45 min    Activity Tolerance Patient tolerated treatment well;No increased pain             Past Medical History:  Diagnosis Date   Allergic rhinitis 04/22/1997   Ankle fracture, right 07/2007   Cholelithiasis    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  He had external beam radiation in August 2001 with 54 Gy in 30 fractions on RTOG 9802 protocol.   He declined chemotherapy.    Seizure (HCC)    from history of oligodendroglioma   Past Surgical History:  Procedure Laterality Date   ANKLE SURGERY     right, x 3   brain biopsy  1997, 2001   Patient Active Problem List   Diagnosis Date Noted   Acute stroke of medulla oblongata (HCC) 11/26/2022   Hyponatremia 11/24/2022   Acute CVA (cerebrovascular accident) (HCC) 11/23/2022   Polycythemia 11/23/2022   Elevated LFTs 04/12/2022   Mixed hyperlipidemia 04/12/2022   Acute cough 04/12/2022   Syncope 04/03/2022   Elevated troponin 04/03/2022   Avascular necrosis of bone of hip, left (HCC) 04/03/2022   Allergic rhinitis 04/03/2022   Eustachian tube dysfunction, left 10/06/2021   Bloody diarrhea 11/17/2017   History of oligodendroglioma of brain 03/22/2017   Status post radiation therapy 03/22/2017   GERD (gastroesophageal reflux disease) 11/11/2016   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   Complex partial seizure (HCC) 12/21/2012   Seasonal allergies 07/08/2008   SMOKELESS TOBACCO ABUSE 08/02/2007   Intracranial tumor  (HCC) 11/17/1999   Seizure disorder (HCC) 01/16/1997    ONSET DATE: Referral date: 12/06/2022   ONSET: 11/23/22  REFERRING DIAG: I63.9 (ICD-10-CM) - CVA (cerebral vascular accident)  THERAPY DIAG:  Other lack of coordination  Hemiplegia and hemiparesis following cerebral infarction affecting left non-dominant side (HCC)  Muscle weakness (generalized)  Other disturbances of skin sensation  Rationale for Evaluation and Treatment: Rehabilitation OT Discharge from Hospital states: "Patient will benefit from ongoing skilled OT services in outpatient setting to continue to advance functional skills in the area of BADL, iADL, Vocation, and Reduce care partner burden."  SUBJECTIVE STATEMENT: Patient has still been experiencing nose bleeds daily and yesterday was out of commission for 45 minutes waiting for it to stop but it wasn't as bad this morning.  He has a follow up with MD next Thursday and leaves for a cruise on Saturday.    He reports thinking he finished a "blood thinner" yesterday.   He was able to touch L pinkie to thumb now and used a big set of clipper to cut his own fingernails (both hands) yesterday.  Pt accompanied by: significant other - Self   PERTINENT HISTORY:   42 yo male with history of oligodendroglioma s/p Postoperative radiotherapy (XRT), complex partial seizures, AVN left hip who was admitted to [facility] on 11/23/2022 with 1 day history of left-sided numbness with left lower extremity weakness followed by sudden  onset of dizziness and diarrhea.  He was found to have acute infarct in medulla as well as unchanged nonenhancing right temporal lobe mass.    Principal Problem:   Acute stroke of medulla oblongata (HCC) Active Problems:   Seizure disorder from history of oligodendroglioma   GERD (gastroesophageal reflux disease)   Allergic rhinitis Hyponatremia Polycythemia   Oligodendroglioma Left temporal; bx in 12/1999.  He had external beam radiation in August 2001  with 54 Gy in 30 fractions on RTOG 9802 protocol.   He declined chemotherapy.   Ankle surgery x 3   PRECAUTIONS: Fall and Other: currently not driving, h/o seizures (managed by medication)  WEIGHT BEARING RESTRICTIONS: No  PAIN:  Are you having pain? No  (Nagging L hip irritation due to Avascular Necrosis L hip - Ortho will not address it orthopaedically unless significant limitations in his function are noted and it becomes necessary)  FALLS: Has patient fallen in last 6 months? No -- Patient reports stumbling 1x the week before last getting his foot hooked up in some laundry in the floor and stumbling against the wall.  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  Currently off work x 6 months, and Leisure: hunting, fishing, watching kids sports   PATIENT GOALS: To be able to do more FM stuff with his left hand, for things to be easier   OBJECTIVE:   HAND DOMINANCE: Right  ADLs:  "At inpatient rehab admission, patient required min to mod assist with basic ADL tasks and min assist with mobility. He exhibited mild dysarthria and mild deficits in higher level tasks. He has had improvement in activity tolerance, balance, postural control as well as ability to compensate for deficits. He is able to complete ADL tasks at modified independent level. He was independent for transfers and is able to ambulate 300' with use of LLE Swedish Knee cage independently in supervised setting and with supervision in community setting. His speech intelligibility has improved and he  is able to utilize strategies for clarity as well as swallowing at modified independent level.  He is tolerating regular diet without any signs of aspiration."  Overall ADLs: Patient reports MI with ADLs although buttons do get frustrating. Transfers/ambulation related to ADLs: MI without knee brace at home Eating: R handed so able to hold fork in L hand to cut food with R hand Grooming: electric razor/trimmer UB Dressing: MI with some assistance for buttons LB Dressing: MI - managed jeans button/zipper and belt today Toileting: MI Bathing: MI - showers in standing generally, he did sit on shower seat today when he got a nose bleed Tub Shower transfers: walk in shower Equipment: Shower seat with back  IADLs:            Shopping: currently relying on spouse Light housekeeping: currently relying on spouse due to standing for long periods of time being difficult due to L hip discomfort Meal Prep: currently relying on spouse - did not have activity tolerance this morning to try and cook eggs Community mobility: depends on spouse - RE: Driving - MD told him to wait  2 weeks and then [place] before resuming driving Medication management: wife puts them in a medicine box due to coordination difficulties Financial management: shares with spouse - mostly via phone Handwriting: 100% legible - R handed  MOBILITY STATUS: Independent and wears knee brace for long distance but not in the house  POSTURE COMMENTS:  No Significant postural limitations Sitting balance:  WFL  ACTIVITY TOLERANCE: Activity tolerance: LIMITED -- Had cleaned off the stove and washed something to make eggs this morning but needed to sit and rest.  FUNCTIONAL OUTCOME MEASURES (Eval): Upper Extremity Functional Scale (UEFS): 68 as rated by patient and 48 by spouse  0 to 80 score range of the UEFS -- Lower scores indicate that the subject is reporting increased difficulty with the activities as a  result of their upper limb condition. While higher scores indicate less severity.   FOTO Severity: Slight (Intake FS: 60)    UPPER EXTREMITY ROM:    A/ROM - WNL L UE moves slower than R UE but achieves same ROM  UPPER EXTREMITY MMT:     R UE - 5/5 L UE - 4 to 4+/5  HAND FUNCTION: Grip strength: Right: 166.8 lbs; Left: 101.1  lbs, Lateral pinch: Right: 30 lbs, Left: 24 lbs, and 3 point pinch: Right: 30 lbs, Left: 20 lbs Hospital DC last week:  R Dominant Hand:  Average: 169.33 lbs L Nondominant (hemiparetic) Hand: Average: 91.67 lbs  COORDINATION: 9 Hole Peg test: Right: 33.94  sec; Left: 102.15 sec Box and Blocks:  Right 46 blocks, Left 27 blocks Used R hand to help L hand on occasion  SENSATION: Light touch: Impaired  Hot/Cold: Impaired   Patient was able to feel light touch on all fingers at least 1/3 times but had difficulty with median nerve distribution with lightest touch and would benefit from more formal testing.  He self reports hot and cold not being as distinct on his L side but noting improvements everyday.   EDEMA: No swelling  MUSCLE TONE: WFL  COGNITION: Overall cognitive status: Within functional limits for tasks assessed  VISION: Subjective report: No issues Baseline vision: No visual deficits Visual history:  N/A  VISION ASSESSMENT: WFL  PERCEPTION: WFL  PRAXIS: WFL  OBSERVATIONS: Patient arrives today with his wife with L knee brace in place and ambulates without AE from lobby to therapy gym.   No loss of balance noted during mobility tasks during short observations of mobility in clinic. He demonstrates full ROM of BUE although L UE moves at a slower rate than R UE and has obvious coordination deficits as noted by time to compete 9 hole peg test at 3x the time for R UE.    TODAY'S TREATMENT:                                                                                                                               Therapeutic Activities -  Buttoning activity x 4 buttons fastened & unfastened in 1:28:33 sec with substantially increased ease with unfastening in < 10 seconds compared to fastening in about 1:20 sec.  Patient engaged in sensory discrimination testing with L hand ~ 6 mm with pinkie at 8 mm.   Stereognosis is limited ie) unable to identify nut, bolt, key with L hand.  Reviewed, demonstrated and elicited return demonstration of Coordination activities Coordination Activities per handout with practice and modification to improve success, challenge and use of L UE as needed.   1) Picking up small objects ie) pennies and putting them in a container (one at a time or by picking up several at a time and moving them to his fingertips)  - He does well with picking up pennies off a washcloth with encouragement to progress to picking up items off the table top.  2) Stacking objects ie) coins or dominos - worked on stacking them 1 at a time or collecting them in between his fingers in a stack  - He frequently drops items and has difficulty getting his fingers around a stack of pennies without fingers/thumb twisting and dropping items.  3) Picking up multiple small objects and putting them in his palm ie) Connect 4 game pieces to put in the board  4) Shuffling and sorting cards ie) flipping cards, matching colors, suits, playing war with children  - He has difficulty flipping cards without flicking it out of his pinch with L hand  5) Rotating golf balls inside his L hand  - He had difficulty at first but after practicing with his R hand he was able to perform task with L hand for at least 5 rotations  6) Rotating and translating pen from one end to the other with good success rotating but increased guidance to move pen up and down in a tripod grasp.   Extra time and guidance is needed to master each task, cues are given to work on positions to maximize stability and modify tasks for increased challenge.  Finally updated  putty exercises from hospital with Finger Pinch and Pull with Putty  (recommended  1 x daily - 3 sets - 10 reps) as well as being encouraged to hide items in the putty to find.  PATIENT EDUCATION: Education details: Manufacturing engineer with images & Theraputty exercise to add to repertoire Person educated: Patient Education method: Explanation, demonstration, handouts, Verbal cues Education comprehension: verbalized understanding, return demonstration and needs further education  HOME EXERCISE PROGRAM: 12/15/22 -- Coordination handouts with images Access Code: [email or web address] URL: [email or web address] Date: 12/15/2022 * Finger Pinch and Pull with Putty  - 1 x daily - 3 sets - 10 reps  GOALS: Goals reviewed with patient? Yes - generally reviewed with specific goals to be reviewed at next visit    SHORT TERM GOALS: Target date: 01/05/23  Will improve buttoning 4 buttons in less than 1 minute  and improve score on UEFI from little difficulty to no difficulty.  Baseline: 1:28  button/unbutton (1:20 to fasten & < 10 seconds to unfasten) Goal status: REVISED  2.  Patient will complete nine-hole peg with use of L UE in less than 1 minute.  Baseline: 102 seconds Goal status: IN PROGRESS  3.  Pt will be able to place at least 32 blocks using left hand with completion of Box and Blocks test.  Baseline: 27 blocks Goal status: IN PROGRESS  4.  Following sensory testing (2 pt discrimination and stereognosis 12/15/22) Patient will identify 5/5 items  with vision occluded in L hand. Baseline: 0/3 items, Light touch 6-8 mm fingertips and self reported decreased awareness of hot/cold. Goal status: REVISED  5.  Patient will report no difficulty with tasks from UEFI test including lifting grocery bags, preparing food, and helping with IADLS in standing for improved UEFI score x 5 points. Baseline: Patient 68/80 & Spouse 48/80 Goal status: INITIAL  6.  Patient will have increased activity  tolerance for standing 15 minutes while moving heavy objects (15+ pounds) to simulate work tasks. Baseline: Patient had to sit after getting dishes ready to make eggs this morning Goal status: INITIAL  LONG TERM GOALS: Target date: 02/04/23  Patient will complete nine-hole peg with use of L in 45 seconds or less.  Baseline: 102 seconds Goal status: IN PROGRESS  2.  Pt will be able to place at least >35 blocks using left hand with completion of Box and Blocks test.  Baseline: 27 blocks Goal status: IN PROGRESS  3.  Patient will report no difficulty with tasks from UEFI test for score of > 75/80 for minimal residual deficits/difficulties. aseline: Patient 68/80 & Spouse 48/80 Goal status: INITIAL  4.  Patient will identify 3 compensatory techniques to be MI with any sensory compensatory techniques for any residual deficits to minimize risk of self-injury. Baseline: Minor light touch deficits and temperature deficits Goal status: IN PROGRESS  5.  Patient will be MI with all HEPs for high level coordination and sustained activity tolerance. Baseline: Aware of some exercises from hospital  Goal status: IN PROGRESS   6.  Patient will have increased activity tolerance for standing 30 minutes while moving heavy objects (30+ pounds) to simulate work tasks and/or complete chores at home as appropriate (laundry/meal prep etc) Baseline:  Goal status: INITIAL   ASSESSMENT:  CLINICAL IMPRESSION: Patient came back to therapy by himself today as his spouse is out of town for work.  He is seen for LUE limitations s/p recent CVA with L UE hemiparesis manifesting in coordination and FM strength deficits rather than gross strength deficits.  He has some sensory deficits and obvious lack of awareness at times ie) will drop items if he is not paying attention.  He will benefit from ongoing skilled OT series to address deficits in L UE coordination, fine motor and sensory awareness.  PERFORMANCE  DEFICITS: in functional skills including ADLs, IADLs, coordination, dexterity, proprioception, sensation, Fine motor control, endurance, and UE functional use,   IMPAIRMENTS: are limiting patient from ADLs, IADLs, work, leisure, and social participation.   CO-MORBIDITIES: has co-morbidities such as h.o seizures and  unchanged nonenhancing right temporal lobe mass.that affects occupational performance. Patient will benefit from skilled OT to address above impairments and improve overall function.  REHAB POTENTIAL: Excellent  PLAN:  OT FREQUENCY: 1-2x/week  OT DURATION: 8 weeks  PLANNED INTERVENTIONS: self care/ADL training, therapeutic exercise, therapeutic activity, neuromuscular re-education, functional mobility training, patient/family education, energy conservation, coping strategies training, and DME and/or AE instructions  RECOMMENDED OTHER SERVICES: Patient scheduled for PT/ST evaluations on Friday  CONSULTED AND AGREED WITH PLAN OF CARE: Patient and family member/caregiver  PLAN FOR NEXT SESSION: Review specific established goals, progress HEP for high level coordination tasks & putty.  Begin progression of activity tolerance activities combined with UE tasks.  Stereognosis activities ie) hidden items in water, rice, beans or simply a bag of items and ask him to find a specific item (ie) different balls.    Victorino Sparrow, OT 12/15/2022, 9:48 AM

## 2022-12-17 ENCOUNTER — Telehealth: Payer: Self-pay | Admitting: Nurse Practitioner

## 2022-12-17 DIAGNOSIS — D6851 Activated protein C resistance: Secondary | ICD-10-CM

## 2022-12-17 NOTE — Telephone Encounter (Signed)
Can we let the patient know that his blood work form the hospital came back positive for a clotting disorder. I want to send him to Hematology to make sure nothing further is needed. Does he prefer [facility] or [facility]

## 2022-12-17 NOTE — Telephone Encounter (Signed)
-----   Message from Hannah Beat, MD sent at 12/15/2022  4:46 PM EDT ----- I will forward + heterozygous Factor V Leiden to ordering physician and PCP.  It appears that [name] is his new pcp, since he did hospital follow-up.

## 2022-12-17 NOTE — Telephone Encounter (Signed)
Left message to return call to our office.  

## 2022-12-20 ENCOUNTER — Ambulatory Visit: Payer: BC Managed Care – PPO | Attending: Physician Assistant | Admitting: Occupational Therapy

## 2022-12-20 ENCOUNTER — Ambulatory Visit: Payer: BC Managed Care – PPO | Admitting: Physical Therapy

## 2022-12-20 DIAGNOSIS — I69354 Hemiplegia and hemiparesis following cerebral infarction affecting left non-dominant side: Secondary | ICD-10-CM | POA: Diagnosis present

## 2022-12-20 DIAGNOSIS — I639 Cerebral infarction, unspecified: Secondary | ICD-10-CM | POA: Insufficient documentation

## 2022-12-20 DIAGNOSIS — R2689 Other abnormalities of gait and mobility: Secondary | ICD-10-CM

## 2022-12-20 DIAGNOSIS — M6281 Muscle weakness (generalized): Secondary | ICD-10-CM | POA: Diagnosis present

## 2022-12-20 DIAGNOSIS — R208 Other disturbances of skin sensation: Secondary | ICD-10-CM

## 2022-12-20 DIAGNOSIS — R278 Other lack of coordination: Secondary | ICD-10-CM

## 2022-12-20 NOTE — Therapy (Signed)
OUTPATIENT OCCUPATIONAL THERAPY NEURO TREATMENT  Patient Name: Corey Chang MRN: [id number] DOB:[birth year], 42 y.o., male Today's Date: 12/20/2022  PCP: Gweneth Dimitri, MD REFERRING PROVIDER: Charlton Amor, PA-C  END OF SESSION:  OT End of Session - 12/20/22 1016     Visit Number 3    Number of Visits 17    Date for OT Re-Evaluation 02/04/23    Authorization Type [employer] - Auth not Reqd    OT Start Time 1016    OT Stop Time 1059    OT Time Calculation (min) 43 min    Activity Tolerance Patient tolerated treatment well;No increased pain              Past Medical History:  Diagnosis Date   Allergic rhinitis 04/22/1997   Ankle fracture, right 07/2007   Cholelithiasis    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  He had external beam radiation in August 2001 with 54 Gy in 30 fractions on RTOG 9802 protocol.   He declined chemotherapy.    Seizure (HCC)    from history of oligodendroglioma   Past Surgical History:  Procedure Laterality Date   ANKLE SURGERY     right, x 3   brain biopsy  1997, 2001   Patient Active Problem List   Diagnosis Date Noted   Acute stroke of medulla oblongata (HCC) 11/26/2022   Hyponatremia 11/24/2022   Acute CVA (cerebrovascular accident) (HCC) 11/23/2022   Polycythemia 11/23/2022   Elevated LFTs 04/12/2022   Mixed hyperlipidemia 04/12/2022   Acute cough 04/12/2022   Syncope 04/03/2022   Elevated troponin 04/03/2022   Avascular necrosis of bone of hip, left (HCC) 04/03/2022   Allergic rhinitis 04/03/2022   Eustachian tube dysfunction, left 10/06/2021   Bloody diarrhea 11/17/2017   History of oligodendroglioma of brain 03/22/2017   Status post radiation therapy 03/22/2017   GERD (gastroesophageal reflux disease) 11/11/2016   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   Complex partial seizure (HCC) 12/21/2012   Seasonal allergies 07/08/2008   SMOKELESS TOBACCO ABUSE 08/02/2007   Intracranial tumor  (HCC) 11/17/1999   Seizure disorder (HCC) 01/16/1997    ONSET DATE: Referral date: 12/06/2022   ONSET: 11/23/22  REFERRING DIAG: I63.9 (ICD-10-CM) - CVA (cerebral vascular accident)  THERAPY DIAG:  Muscle weakness (generalized)  Other disturbances of skin sensation  Other lack of coordination  Hemiplegia and hemiparesis following cerebral infarction affecting left non-dominant side (HCC)  Acute stroke of medulla oblongata (HCC)  Rationale for Evaluation and Treatment: Rehabilitation OT Discharge from Hospital states: "Patient will benefit from ongoing skilled OT services in outpatient setting to continue to advance functional skills in the area of BADL, iADL, Vocation, and Reduce care partner burden."  SUBJECTIVE STATEMENT: Patient has still been experiencing nose bleeds daily and yesterday was out of commission for 45 minutes waiting for it to stop but it wasn't as bad this morning.  He has a follow up with MD next Thursday and leaves for a cruise on Saturday.    He reports thinking he finished a "blood thinner" yesterday.   He was able to touch L pinkie to thumb now and used a big set of clipper to cut his own fingernails (both hands) yesterday.  Pt accompanied by: significant other - Self   PERTINENT HISTORY:   42 yo male with history of oligodendroglioma s/p Postoperative radiotherapy (XRT), complex partial seizures, AVN left hip who was admitted to [facility] on 11/23/2022 with 1 day history of left-sided numbness  with left lower extremity weakness followed by sudden onset of dizziness and diarrhea.  He was found to have acute infarct in medulla as well as unchanged nonenhancing right temporal lobe mass.    Principal Problem:   Acute stroke of medulla oblongata (HCC) Active Problems:   Seizure disorder from history of oligodendroglioma   GERD (gastroesophageal reflux disease)   Allergic rhinitis Hyponatremia Polycythemia   Oligodendroglioma Left temporal; bx in 12/1999.  He  had external beam radiation in August 2001 with 54 Gy in 30 fractions on RTOG 9802 protocol.   He declined chemotherapy.   Ankle surgery x 3   PRECAUTIONS: Fall and Other: currently not driving, h/o seizures (managed by medication)  WEIGHT BEARING RESTRICTIONS: No  PAIN:  Are you having pain? No   FALLS: Has patient fallen in last 6 months? No -- Patient reports stumbling 1x the week before last getting his foot hooked up in some laundry in the floor and stumbling against the wall.  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  Currently off work x 6 months, and Leisure: hunting, fishing, watching kids sports   PATIENT GOALS: To be able to do more FM stuff with his left hand, for things to be easier   OBJECTIVE:   HAND DOMINANCE: Right  ADLs:  "At inpatient rehab admission, patient required min to mod assist with basic ADL tasks and min assist with mobility. He exhibited mild dysarthria and mild deficits in higher level tasks. He has had improvement in activity tolerance, balance, postural control as well as ability to compensate for deficits. He is able to complete ADL tasks at modified independent level. He was independent for transfers and is able to ambulate 300' with use of LLE Swedish Knee cage independently in supervised setting and with supervision in community setting. His speech intelligibility has improved and he is able to utilize strategies for clarity as well as swallowing at modified independent level.  He is tolerating regular diet without any signs  of aspiration."  Overall ADLs: Patient reports MI with ADLs although buttons do get frustrating. Transfers/ambulation related to ADLs: MI without knee brace at home Eating: R handed so able to hold fork in L hand to cut food with R hand Grooming: electric razor/trimmer UB Dressing: MI with some assistance for buttons LB Dressing: MI - managed jeans button/zipper and belt today Toileting: MI Bathing: MI - showers in standing generally, he did sit on shower seat today when he got a nose bleed Tub Shower transfers: walk in shower Equipment: Shower seat with back  IADLs:            Shopping: currently relying on spouse Light housekeeping: currently relying on spouse due to standing for long periods of time being difficult due to L hip discomfort Meal Prep: currently relying on spouse - did not have activity tolerance this morning to try and cook eggs Community mobility: depends on spouse - RE: Driving - MD told him to wait 2 weeks and then [place] before resuming driving Medication management: wife puts them in a  medicine box due to coordination difficulties Financial management: shares with spouse - mostly via phone Handwriting: 100% legible - R handed  MOBILITY STATUS: Independent and wears knee brace for long distance but not in the house  POSTURE COMMENTS:  No Significant postural limitations Sitting balance:  WFL  ACTIVITY TOLERANCE: Activity tolerance: LIMITED -- Had cleaned off the stove and washed something to make eggs this morning but needed to sit and rest.  FUNCTIONAL OUTCOME MEASURES (Eval): Upper Extremity Functional Scale (UEFS): 68 as rated by patient and 48 by spouse  0 to 80 score range of the UEFS -- Lower scores indicate that the subject is reporting increased difficulty with the activities as a result of their upper limb condition. While higher scores indicate less severity.   FOTO Severity: Slight (Intake FS: 60)    UPPER  EXTREMITY ROM:    A/ROM - WNL L UE moves slower than R UE but achieves same ROM  UPPER EXTREMITY MMT:     R UE - 5/5 L UE - 4 to 4+/5  HAND FUNCTION: Grip strength: Right: 166.8 lbs; Left: 101.1  lbs, Lateral pinch: Right: 30 lbs, Left: 24 lbs, and 3 point pinch: Right: 30 lbs, Left: 20 lbs Hospital DC last week:  R Dominant Hand:  Average: 169.33 lbs L Nondominant (hemiparetic) Hand: Average: 91.67 lbs  COORDINATION: 9 Hole Peg test: Right: 33.94  sec; Left: 102.15 sec Box and Blocks:  Right 46 blocks, Left 27 blocks Used R hand to help L hand on occasion  SENSATION: Light touch: Impaired  Hot/Cold: Impaired   Patient was able to feel light touch on all fingers at least 1/3 times but had difficulty with median nerve distribution with lightest touch and would benefit from more formal testing.  He self reports hot and cold not being as distinct on his L side but noting improvements everyday.   EDEMA: No swelling  MUSCLE TONE: WFL  COGNITION: Overall cognitive status: Within functional limits for tasks assessed  VISION: Subjective report: No issues Baseline vision: No visual deficits Visual history:  N/A  VISION ASSESSMENT: WFL  PERCEPTION: WFL  PRAXIS: WFL  OBSERVATIONS: Patient arrives today with his wife with L knee brace in place and ambulates without AE from lobby to therapy gym.   No loss of balance noted during mobility tasks during short observations of mobility in clinic. He demonstrates full ROM of BUE although L UE moves at a slower rate than R UE and has obvious coordination deficits as noted by time to compete 9 hole peg test at 3x the time for R UE.    TODAY'S TREATMENT:                                                                                                                               Therapeutic Activities -  Reviewed putty HEP pt requiring min cueing for proper execution.   With use of L, pt placed and  then removed colored, medium pegs  into corresponding hole with use of pattern sheets for ROM, coordination, and strength of affected extremity.  Wrist flex and ext with tan flex bar x 2 min for strength and endurance of affected extremity  Supination with tan flex bar x 2 min for strength and endurance of affected extremity  Pronation with tan flex bar x 2 min for strength and endurance of affected extremity  Standing in front of mirror, pt completed buttoning and unbuttoning of 3 button shirt x 5 min for improved sensory input, activity tolerance in standing, and coordination. No LOB. Good tolerance.  PATIENT EDUCATION: Education details: Coordination  Person educated: Patient Education method: Explanation, demonstration, Verbal cues Education comprehension: verbalized understanding, return demonstration and needs further education  HOME EXERCISE PROGRAM: 12/15/22 -- Coordination handouts with images Access Code: [email or web address] URL: [email or web address] Date: 12/15/2022 * Finger Pinch and Pull with Putty  - 1 x daily - 3 sets - 10 reps  GOALS: Goals reviewed with patient? Yes - generally reviewed with specific goals to be reviewed at next visit    SHORT TERM GOALS: Target date: 01/05/23  Will improve buttoning 4 buttons in less than 1 minute  and improve score on UEFI from little difficulty to no difficulty.  Baseline: 1:28  button/unbutton (1:20 to fasten & < 10 seconds to unfasten) Goal status: REVISED  2.  Patient will complete nine-hole peg with use of L UE in less than 1 minute.  Baseline: 102 seconds Goal status: IN PROGRESS  3.  Pt will be able to place at least 32 blocks using left hand with completion of Box and Blocks test.  Baseline: 27 blocks Goal status: IN PROGRESS  4.  Following sensory testing (2 pt discrimination and stereognosis 12/15/22) Patient will identify 5/5 items with vision occluded in L hand. Baseline: 0/3 items, Light touch 6-8 mm fingertips and self reported decreased  awareness of hot/cold. Goal status: REVISED  5.  Patient will report no difficulty with tasks from UEFI test including lifting grocery bags, preparing food, and helping with IADLS in standing for improved UEFI score x 5 points. Baseline: Patient 68/80 & Spouse 48/80 Goal status: INITIAL  6.  Patient will have increased activity tolerance for standing 15 minutes while moving heavy objects (15+ pounds) to simulate work tasks. Baseline: Patient had to sit after getting dishes ready to make eggs this morning Goal status: INITIAL  LONG TERM GOALS: Target date: 02/04/23  Patient will complete nine-hole peg with use of L in 45 seconds or less.  Baseline: 102 seconds Goal status: IN PROGRESS  2.  Pt will be able to place at least >35 blocks using left hand with completion of Box and Blocks test.  Baseline: 27 blocks Goal status: IN PROGRESS  3.  Patient will report no difficulty with tasks from UEFI test for score of > 75/80 for minimal residual deficits/difficulties. aseline: Patient 68/80 & Spouse 48/80 Goal status: INITIAL  4.  Patient will identify 3 compensatory techniques to be MI with any sensory compensatory techniques for any residual deficits to minimize risk of self-injury. Baseline: Minor light touch deficits and temperature deficits Goal status: IN PROGRESS  5.  Patient will be MI with all HEPs for high level coordination and sustained activity tolerance. Baseline: Aware of some exercises from hospital  Goal status: IN PROGRESS   6.  Patient will have increased activity tolerance for standing 30 minutes while moving heavy objects (30+ pounds) to simulate work tasks and/or complete  chores at home as appropriate (laundry/meal prep etc) Baseline:  Goal status: INITIAL   ASSESSMENT:  CLINICAL IMPRESSION: Pt demonstrates good motivation and participation in therapy as needed to progress towards goals. Pt benefiting from comparison use of RUE with task completion. Continue to  work on coordination and sensation of LUE.   PERFORMANCE DEFICITS: in functional skills including ADLs, IADLs, coordination, dexterity, proprioception, sensation, Fine motor control, endurance, and UE functional use,   IMPAIRMENTS: are limiting patient from ADLs, IADLs, work, leisure, and social participation.   CO-MORBIDITIES: has co-morbidities such as h.o seizures and  unchanged nonenhancing right temporal lobe mass.that affects occupational performance. Patient will benefit from skilled OT to address above impairments and improve overall function.  REHAB POTENTIAL: Excellent  PLAN:  OT FREQUENCY: 1-2x/week  OT DURATION: 8 weeks  PLANNED INTERVENTIONS: self care/ADL training, therapeutic exercise, therapeutic activity, neuromuscular re-education, functional mobility training, patient/family education, energy conservation, coping strategies training, and DME and/or AE instructions  RECOMMENDED OTHER SERVICES: Patient scheduled for PT/ST evaluations on Friday  CONSULTED AND AGREED WITH PLAN OF CARE: Patient and family member/caregiver  PLAN FOR NEXT SESSION: Review specific established goals as needed;  Begin progression of activity tolerance activities combined with UE tasks.  Stereognosis activities ie) hidden items in water, rice, beans or simply a bag of items and ask him to find a specific item (ie) different balls.    Delana Meyer, OT 12/20/2022, 5:42 PM

## 2022-12-20 NOTE — Therapy (Signed)
OUTPATIENT PHYSICAL THERAPY NEURO TREATMENT   Patient Name: Corey Chang MRN: [id number] DOB:April 28, 1981, 42 y.o., male Today's Date: 12/20/2022   PCP: Eden Emms, MD REFERRING PROVIDER: Charlton Amor, PA-C  END OF SESSION:  PT End of Session - 12/20/22 0931     Visit Number 3    Number of Visits 9   8 + eval   Date for PT Re-Evaluation 02/11/23   pushed out to accomodate scheduling w/ OT   Authorization Type BLUE CROSS BLUE SHIELD    PT Start Time 0930    PT Stop Time 1012    PT Time Calculation (min) 42 min    Equipment Utilized During Treatment Gait belt   Left knee cage   Activity Tolerance Patient tolerated treatment well    Behavior During Therapy WFL for tasks assessed/performed;Impulsive              Past Medical History:  Diagnosis Date   Allergic rhinitis 04/22/1997   Ankle fracture, right 07/2007   Cholelithiasis    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  He had external beam radiation in August 2001 with 54 Gy in 30 fractions on RTOG 9802 protocol.   He declined chemotherapy.    Seizure (HCC)    from history of oligodendroglioma   Past Surgical History:  Procedure Laterality Date   ANKLE SURGERY     right, x 3   brain biopsy  1997, 2001   Patient Active Problem List   Diagnosis Date Noted   Acute stroke of medulla oblongata (HCC) 11/26/2022   Hyponatremia 11/24/2022   Acute CVA (cerebrovascular accident) (HCC) 11/23/2022   Polycythemia 11/23/2022   Elevated LFTs 04/12/2022   Mixed hyperlipidemia 04/12/2022   Acute cough 04/12/2022   Syncope 04/03/2022   Elevated troponin 04/03/2022   Avascular necrosis of bone of hip, left (HCC) 04/03/2022   Allergic rhinitis 04/03/2022   Eustachian tube dysfunction, left 10/06/2021   Bloody diarrhea 11/17/2017   History of oligodendroglioma of brain 03/22/2017   Status post radiation therapy 03/22/2017   GERD (gastroesophageal reflux disease) 11/11/2016   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   Complex partial seizure (HCC) 12/21/2012   Seasonal allergies 07/08/2008   SMOKELESS TOBACCO ABUSE 08/02/2007   Intracranial tumor (HCC) 11/17/1999   Seizure disorder (HCC) 01/16/1997    ONSET DATE: 11/26/2022 (CVA)  REFERRING DIAG: I63.9 (ICD-10-CM) - Cerebral infarction, unspecified  THERAPY DIAG:  Muscle weakness (generalized)  Other disturbances of skin sensation  Other abnormalities of gait and mobility  Other lack of coordination  Rationale for Evaluation and Treatment: Rehabilitation  SUBJECTIVE:  SUBJECTIVE STATEMENT: Pt reports that he continues to have daily nosebleeds, sees Dr. Kirtland Bouchard this Thursday (6/6) and will bring it up. Pt leaves for his cruise on Friday (6/7). Pt wearing L knee cage to this session.  Pt accompanied by: significant other-wife, [name]; pt states he should be cleared to return to driving in the next week  PERTINENT HISTORY: L hip AVN (ortho not planning to address unless significant limitations in function-followed by [facility]), ankle ORIF x3, oligodendroglioma s/p Postoperative radiotherapy (XRT), complex partial seizures  Right hand dominant male who was admitted to [facility] on 11/23/2022 with 1 day history of left-sided numbness with left lower extremity weakness followed by sudden onset of dizziness and diarrhea.  He was found to have acute infarct in medulla.  PAIN:  Are you having pain? 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 2. Unchanged nonenhancing right temporal lobe mass, previously reported to represent an oligodendroglioma.  COGNITION: Overall cognitive status: Within functional limits for tasks assessed   SENSATION: Light touch: Impaired ; left side feels more dull compared to right  COORDINATION: LE RAMS:  dysmetric Heel-to-shin:  limited by L Hip AVN  EDEMA:  None noted in BLE  MUSCLE TONE: None noted during functional assessment  POSTURE: No Significant postural limitations  LOWER EXTREMITY ROM:     Active  Right Eval Left Eval  Hip flexion Grossly WFL  Hip extension   Hip abduction   Hip adduction   Hip internal rotation   Hip external rotation   Knee flexion   Knee extension   Ankle dorsiflexion   Ankle plantarflexion    Ankle inversion    Ankle eversion     (Blank rows = not tested)  LOWER EXTREMITY MMT:    MMT Right Eval Left Eval  Hip flexion 5/5 4+/5  Hip extension    Hip abduction " "  Hip adduction " "  Hip internal rotation    Hip external rotation    Knee flexion " "  Knee extension " "  Ankle dorsiflexion " "  Ankle plantarflexion    Ankle inversion    Ankle eversion    (Blank rows = not tested)  BED MOBILITY:  Sit to supine  Complete Independence Supine to sit Complete Independence Rolling to Right Complete Independence Rolling to Left Complete Independence  TRANSFERS: Assistive device utilized: None  Sit to stand: Complete Independence Stand to sit: Complete Independence Chair to chair: Complete Independence  GAIT: Gait pattern: decreased hip/knee  flexion- Left and genu recurvatum- Left Distance walked: various clinic distances Assistive device utilized: None Level of assistance: SBA Comments: Pt has one instance of left LE buckling stating his hip was catching and hurting.  FUNCTIONAL TESTS:  5 times sit to stand: 6.72 seconds no UE support, intermittent touch of back of legs to chair, no LOB 6 minute walk test: To be assessed. Functional gait assessment: To be assessed.  PATIENT SURVEYS:  FOTO not captured at intake.  TODAY'S TREATMENT:                                                                                                                              TherEx Standing LLE TKE with GTB 3 x 15 reps Staggered squats 3 x 10 reps (some posterior LOB, needs cues to weight shift to the L)  Added to HEP, see bolded below  NMR: In // bars to work on LLE strengthening and NMR with BUE support unless otherwise stated: 4" step ups 3 x 10 reps with LLE 4" eccentric step downs 3 x 10 reps standing on LLE Foam beam step overs x 5 reps with LLE (easy) 6" hurdle LLE fwd/back step overs x 10 reps with BUE support, x 10 reps no UE support (R hip fatigues)  In therapy gym with no UE support and CGA to work on L hip strengthening: Ambulation through 6" hurdles alt L/R step-overs with focus on decreased circumduction, 6 x 25 ft Lateral stepping over 6" hurdles 2 x 25 ft L/R   PATIENT EDUCATION: Education details: continue HEP, added to HEP, importance of slow, controlled movements Person educated: Patient Education method: Explanation and Handouts Education comprehension: verbalized  understanding  HOME EXERCISE PROGRAM: Access Code: [email or web address] URL: [email or web address] Date: 12/15/2022 Prepared by: Peter Congo  Exercises - Staggered Sit-to-Stand  - 1 x daily - 7 x weekly - 3 sets - 10 reps - Standing Terminal Knee Extension with Resistance  - 1 x daily - 7 x weekly - 3 sets - 10 reps - Staggered Stance Squat  - 1 x daily - 7 x weekly - 3 sets - 10 reps  GOALS: Goals reviewed with patient? Yes  SHORT TERM GOALS: Target date: 01/07/2023  Pt will ambulate greater than or equal to 1300 feet on with LRAD and mod I for improved cardiovascular endurance and BLE strength.  Baseline:  1114 ft (5/29) Goal status: INITIAL  2.  Pt will improve FGA to 23/30 for decreased fall risk  Baseline: 20/30 (5/29) Goal status: INITIAL  LONG TERM GOALS: Target date: 02/04/2023  Pt will be independent with strength and balance HEP to improve mobility and functional independence. Baseline: To be established. Goal status: INITIAL  2.  Pt will ambulate greater than or equal to 1500 feet on with LRAD and mod I for improved cardiovascular endurance and BLE strength.  Baseline: 1114 ft (5/29) Goal status: INITIAL  3.  Pt will improve FGA to  26/30 for decreased fall risk  Baseline: 20/30 (5/29) Goal status: INITIAL  4.  Pt will ambulate >/=500 feet with LRAD over unlevel indoor and outdoor surfaces demonstrating improved left knee control at no more than modI level of assist to promote household and community access. Baseline: various level clinic distances w/ left genu recurvatum wearing left knee cage Goal status: INITIAL  ASSESSMENT:  CLINICAL IMPRESSION: Emphasis of skilled PT session on continuing to work on LLE strengthening and NMR. Pt does exhibit improved control of L knee during gait with use of knee cage this date especially with onset of fatigue, pt does not wear brace for NMR exercises during session. Pt exhibits onset of fatigue of L quads  during session with some spasms of muscles noted. Pt also exhibits an improvement in ability to perform exercises in a slow, controlled manner during session so added to HEP. Pt continues to benefit from skilled therapy services to work towards improving his LLE strength to improve his balance and gait mechanics and decrease his fall risk. Continue POC.   OBJECTIVE IMPAIRMENTS: Abnormal gait, decreased activity tolerance, decreased balance, decreased coordination, decreased endurance, decreased knowledge of use of DME, difficulty walking, decreased strength, and impaired sensation.   ACTIVITY LIMITATIONS: carrying, lifting, squatting, and locomotion level  PARTICIPATION LIMITATIONS: driving, community activity, and occupation  PERSONAL FACTORS: Fitness, Past/current experiences, Profession, Sex, Transportation, and 1-2 comorbidities: Left hip AVN and right oligodendroglioma  are also affecting patient's functional outcome.   REHAB POTENTIAL: Excellent  CLINICAL DECISION MAKING: Evolving/moderate complexity  EVALUATION COMPLEXITY: Moderate  PLAN:  PT FREQUENCY: 1x/week  PT DURATION: 8 weeks  PLANNED INTERVENTIONS: Therapeutic exercises, Therapeutic activity, Neuromuscular re-education, Balance training, Gait training, Patient/Family education, Self Care, Stair training, Vestibular training, DME instructions, Manual therapy, and Re-evaluation  PLAN FOR NEXT SESSION: add to HEP for L NMR/strength, coordination tasks, L quad strengthening, step ups, eccentric step downs, hurdles, uneven surfaces   Peter Congo, PT, DPT, CSRS 12/20/2022, 10:12 AM

## 2022-12-21 NOTE — Telephone Encounter (Signed)
Called and spoke to his wife who is on his DPR and she said they prefer [place].

## 2022-12-22 ENCOUNTER — Telehealth: Payer: Self-pay | Admitting: Hematology and Oncology

## 2022-12-22 NOTE — Telephone Encounter (Signed)
scheduled per referral, pt has been called and confirmed date and time. Pt is aware of location and to arrive early for check in   

## 2022-12-22 NOTE — Addendum Note (Signed)
Addended by: Eden Emms on: 12/22/2022 12:57 PM   Modules accepted: Orders

## 2022-12-23 ENCOUNTER — Encounter: Payer: Self-pay | Admitting: Physical Medicine & Rehabilitation

## 2022-12-23 ENCOUNTER — Encounter
Payer: BC Managed Care – PPO | Attending: Physical Medicine & Rehabilitation | Admitting: Physical Medicine & Rehabilitation

## 2022-12-23 VITALS — BP 116/81 | HR 88 | Ht 71.0 in | Wt 184.0 lb

## 2022-12-23 DIAGNOSIS — I639 Cerebral infarction, unspecified: Secondary | ICD-10-CM | POA: Insufficient documentation

## 2022-12-23 NOTE — Progress Notes (Addendum)
Subjective:    Patient ID: Corey Chang, male    DOB: 20-Aug-1980, 42 y.o.   MRN: [id number] 42 y.o. right-handed male with history of oligodendroglioma s/p XRT, complex partial seizures, AVN left hip who was admitted to [facility] on 11/23/2022 with 1 day history of left-sided numbness with left lower extremity weakness followed by sudden onset of dizziness and diarrhea.  He was found to have acute infarct in [place] as well as unchanged nonenhancing right temporal lobe mass.  Dr. Selina Cooley felt the stroke was due to small vessel disease and recommended DAPT x 3 weeks followed by aspirin alone.  She also recommended cardiac monitor as well as hypercoagulable panel due to young age and results are pending.  JAK2 analysis and erythropoietin levels were ordered for workup of polycythemia.  He continued to be limited by left-sided sensory deficits, left knee instability as well as decrease in fine motor movement of RUE.    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  He was last seen upon discharge from inpatient debilitation unit on 12/03/2022.  He continued aspirin and Plavix until 12/14/2022.  Experiencing nosebleeds on a daily basis until about 2 to 3 days ago when they spontaneously stopped.  We discussed that these were likely related to his Plavix and that even after he stopped it it would take several days for the anticoagulant effect to leave his body. 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  Some of his work was on the computer and some was actually placing large stickers onto the signs.  He states that he did not need to be up on a ladder for this job.  Cooks breakfast Does laundry Modified independent with all self-care and mobility.  He uses a Swedish knee cage to ambulate but no  assistive device He is continuing with outpatient PT OT and is scheduled through the end of July.  Pain Inventory Average Pain 0 Pain Right Now 0 My pain is constant and numbness  LOCATION OF PAIN  Numbness in left arm & Left leg  BOWEL Number of stools per week: 7  BLADDER Normal   Mobility walk without assistance ability to climb steps?  yes do you drive?  no Do you have any goals in this area?  no  Function employed # of hrs/week on medical leave since 11/2022 Do you have any goals in this area?  yes  Neuro/Psych numbness trouble walking  Prior Studies Any changes since last visit?  no  Physicians involved in your care Any changes since last visit?  no   Family History  Problem Relation Age of Onset   Alzheimer's disease Mother    Diabetes Father    Heart disease Maternal Grandfather        MI   Parkinsonism Paternal Grandmother    Alzheimer's disease Paternal Grandmother    Depression Neg Hx    Drug abuse Neg Hx    Alcohol abuse Neg Hx    Cancer Neg Hx        no colon, prostate, breast, uterine,ovarian   Social History   Socioeconomic History   Marital status: Married    Spouse name: Corey Chang   Number of children: 2   Years of education: college   Highest education level: Associate degree: occupational, Scientist, product/process development, or vocational program  Occupational History  Occupation: sign erector    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[employer]; 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[place], [place]    Employer: [employer]  Tobacco Use   Smoking status: Former    Packs/day: 0.50    Years: 15.00    Additional pack years: 0.00    Total pack years: 7.50    Types: Cigarettes    Quit date: 07/19/2006    Years since quitting: 16.4   Smokeless tobacco: Current    Types: Snuff   Tobacco comments:    Quit 2006  Dipping everyday  Vaping Use   Vaping Use: Never used  Substance and Sexual Activity   Alcohol use: Not Currently    Alcohol/week: 2.0 standard drinks of alcohol    Types: 2 Cans of beer per  week    Comment: 2-3 beer daily   Drug use: No   Sexual activity: Yes    Birth control/protection: Pill  Other Topics Concern   Not on file  Social History Narrative   10/06/21   From: the area   Living: with wife, Corey Chang (2008) and children   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[employer]   Three story   Family: 2 children - Corey Chang (2011) and Corey Chang (2014)      Enjoys: shoot - hunting and shooting      Exercise: trying to get kids to do soccer and play with children in sports   Diet: whatever he wants      Safety   Seat belts: Yes  and occasionally does not   Guns: Yes and secure   Safe in relationships: Yes       Social Determinants of Health   Financial Resource Strain: Low Risk  (12/07/2022)   Overall Financial Resource Strain (CARDIA)    Difficulty of Paying Living Expenses: Not hard at all  Food Insecurity: No Food Insecurity (12/07/2022)   Hunger Vital Sign    Worried About Running Out of Food in the Last Year: Never true    Ran Out of Food in the Last Year: Never true  Transportation Needs: No Transportation Needs (12/07/2022)   PRAPARE - Administrator, Civil Service (Medical): No    Lack of Transportation (Non-Medical): No  Physical Activity: Sufficiently Active (12/07/2022)   Exercise Vital Sign    Days of Exercise per Week: 5 days    Minutes of Exercise per Session: 50 min  Stress: No Stress Concern Present (12/07/2022)   [employer] of Occupational Health - Occupational Stress Questionnaire    Feeling of Stress : Not at all  Social Connections: Socially Integrated (12/07/2022)   Social Connection and Isolation Panel [NHANES]    Frequency of Communication with Friends and Family: More than three times a week    Frequency of Social Gatherings with Friends and Family: Once a week    Attends Religious Services: More than 4 times per year    Active Member of [employer] or Organizations: Yes    Attends Engineer, structural: More than 4 times per year     Marital Status: Married   Past Surgical History:  Procedure Laterality Date   ANKLE SURGERY     right, x 3   brain biopsy  1997, 2001   Past Medical History:  Diagnosis Date   Allergic rhinitis 04/22/1997   Ankle fracture, right 07/2007   Cholelithiasis    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  He had external beam radiation in August 2001 with 54 Gy in  30 fractions on RTOG 9802 protocol.   He declined chemotherapy.    Seizure (HCC)    from history of oligodendroglioma   BP 116/81   Pulse 88   Ht 5\' 11"  (1.803 m)   Wt 184 lb (83.5 kg)   SpO2 98%   BMI 25.66 kg/m   Opioid Risk Score:   Fall Risk Score:  `1  Depression screen PHQ 2/9     12/23/2022    1:00 PM 11/11/2016    9:37 AM  Depression screen PHQ 2/9  Decreased Interest 0 0  Down, Depressed, Hopeless 0 0  PHQ - 2 Score 0 0    Review of Systems  Musculoskeletal:  Positive for gait problem.  Neurological:  Positive for weakness and numbness.  Hematological:  Bruises/bleeds easily.  All other systems reviewed and are negative.     Objective:   Physical Exam  General no acute distress Mood and affect appropriate There is no evidence of visual field cut Extraocular muscles are intact Speech is without evidence of dysarthria or aphasia Motor strength is 5/5 in the right deltoid bicep tricep grip, hip flexor knee extensor ankle dorsiflexor and plantar flexor 4/5 left deltoid, bicep, tricep, grip, hip flexor, knee extensor, 5/5 ankle dorsiflexor plantar flexor  Sensation intact to light touch bilateral upper and lower limbs There is no evidence of dysmetria finger-nose-finger testing bilaterally There is mild dysdiadochokinesis with rapid alternating supination pronation of the left upper extremity There is decreased rapidity of finger thumb opposition on the left side versus right side. Ambulates without assistive device no evidence of toe drag or knee instability patient has a mildly  antalgic gait which patient states was from a chronic hip injury.  He has no appreciable change in his gait with or without the [place] knee cage.  Only saw 2-3 steps where knee hyperextension appeared to be an issue.      Assessment & Plan:   1.  Medullary infarct with residual left upper extremity greater than lower extremity weakness.  Overall he has made excellent improvements.  He is at a modified independent level for basic self-care as well as some complex ADLs. Would like to see him back after he completes his outpatient PT OT in July I will see him back early August.  We will discuss return to work at that time  Received message from Dr Karel Jarvis, neurology, pt last seizure was in April 2024.  Per state law must be seizure free x 6 mo prior to return to driving.  Pt will need to be cleared by Neurology .   I call pt and explained this to him and he acknowledged this

## 2022-12-23 NOTE — Patient Instructions (Signed)

## 2022-12-25 NOTE — Progress Notes (Signed)
Left message for patient and wife about lab results that came in my inbox and I wanted to go over the results with him.

## 2022-12-26 NOTE — Progress Notes (Signed)
Left message for wife about lab test that came back that I wanted to go hover results. Dr Renae Gloss

## 2022-12-27 NOTE — Progress Notes (Signed)
Called patient's wife and left a message that I did get results of a blood test that I just wanted to go over.  Looking through his letter GERD a little bit further looks like he has already been referred to outpatient hematology.  Dr. Alford Highland.

## 2022-12-30 ENCOUNTER — Encounter: Payer: BC Managed Care – PPO | Admitting: Nurse Practitioner

## 2023-01-03 ENCOUNTER — Other Ambulatory Visit (HOSPITAL_COMMUNITY): Payer: Self-pay

## 2023-01-03 ENCOUNTER — Ambulatory Visit: Payer: BC Managed Care – PPO | Admitting: Neurology

## 2023-01-03 ENCOUNTER — Encounter: Payer: Self-pay | Admitting: Neurology

## 2023-01-03 VITALS — BP 113/79 | HR 73 | Ht 71.0 in | Wt 186.0 lb

## 2023-01-03 DIAGNOSIS — I639 Cerebral infarction, unspecified: Secondary | ICD-10-CM | POA: Diagnosis not present

## 2023-01-03 DIAGNOSIS — C712 Malignant neoplasm of temporal lobe: Secondary | ICD-10-CM

## 2023-01-03 DIAGNOSIS — G40219 Localization-related (focal) (partial) symptomatic epilepsy and epileptic syndromes with complex partial seizures, intractable, without status epilepticus: Secondary | ICD-10-CM | POA: Diagnosis not present

## 2023-01-03 NOTE — Patient Instructions (Signed)
Good to see you doing better.  Continue Lamotrigine ER 200mg  every night and Levetiracetam 500mg : take 3 tablets twice a day  2. Continue daily aspirin and Lipitor  3. Proceed with Hematology evaluation in July  4. Continue physical therapy  5. For any sudden changes in symptoms, go to ER immediately  6. Follow-up in 3 months, call for any changes   Seizure Precautions: 1. If medication has been prescribed for you to prevent seizures, take it exactly as directed.  Do not stop taking the medicine without talking to your doctor first, even if you have not had a seizure in a long time.   2. Avoid activities in which a seizure would cause danger to yourself or to others.  Don't operate dangerous machinery, swim alone, or climb in high or dangerous places, such as on ladders, roofs, or girders.  Do not drive unless your doctor says you may.  3. If you have any warning that you may have a seizure, lay down in a safe place where you can't hurt yourself.    4.  No driving for 6 months from last seizure, as per [facility].   Please refer to the following link on the Epilepsy Foundation of America's website for more information: http://www.epilepsyfoundation.org/answerplace/Social/driving/drivingu.cfm   5.  Maintain good sleep hygiene. Avoid alcohol.  6.  Contact your doctor if you have any problems that may be related to the medicine you are taking.  7.  Call 911 and bring the patient back to the ED if:        A.  The seizure lasts longer than 5 minutes.       B.  The patient doesn't awaken shortly after the seizure  C.  The patient has new problems such as difficulty seeing, speaking or moving  D.  The patient was injured during the seizure  E.  The patient has a temperature over 102 F (39C)  F.  The patient vomited and now is having trouble breathing
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
## 2023-01-04 ENCOUNTER — Other Ambulatory Visit (HOSPITAL_COMMUNITY): Payer: Self-pay

## 2023-01-04 ENCOUNTER — Other Ambulatory Visit: Payer: Self-pay | Admitting: Nurse Practitioner

## 2023-01-04 DIAGNOSIS — I639 Cerebral infarction, unspecified: Secondary | ICD-10-CM

## 2023-01-04 DIAGNOSIS — K219 Gastro-esophageal reflux disease without esophagitis: Secondary | ICD-10-CM

## 2023-01-05 ENCOUNTER — Ambulatory Visit: Payer: BC Managed Care – PPO | Admitting: Occupational Therapy

## 2023-01-05 ENCOUNTER — Ambulatory Visit: Payer: BC Managed Care – PPO | Admitting: Physical Therapy

## 2023-01-05 ENCOUNTER — Encounter: Payer: Self-pay | Admitting: Physical Therapy

## 2023-01-05 DIAGNOSIS — M6281 Muscle weakness (generalized): Secondary | ICD-10-CM

## 2023-01-05 DIAGNOSIS — R208 Other disturbances of skin sensation: Secondary | ICD-10-CM

## 2023-01-05 DIAGNOSIS — R278 Other lack of coordination: Secondary | ICD-10-CM

## 2023-01-05 DIAGNOSIS — I69354 Hemiplegia and hemiparesis following cerebral infarction affecting left non-dominant side: Secondary | ICD-10-CM

## 2023-01-05 DIAGNOSIS — R2689 Other abnormalities of gait and mobility: Secondary | ICD-10-CM

## 2023-01-05 NOTE — Therapy (Signed)
OUTPATIENT OCCUPATIONAL THERAPY NEURO TREATMENT  Patient Name: Corey Chang MRN: [id number] DOB:31-Mar-1981, 42 y.o., male Today's Date: 01/05/2023  PCP: Gweneth Dimitri, MD REFERRING PROVIDER: Charlton Amor, PA-C  END OF SESSION:  OT End of Session - 01/05/23 1104     Visit Number 4    Number of Visits 17    Date for OT Re-Evaluation 02/04/23    Authorization Type [employer] - Auth not Reqd    OT Start Time 1103    OT Stop Time 1148    OT Time Calculation (min) 45 min    Equipment Utilized During Treatment Testing material    Activity Tolerance Patient tolerated treatment well    Behavior During Therapy [facility] for tasks assessed/performed              Past Medical History:  Diagnosis Date   Allergic rhinitis 04/22/1997   Ankle fracture, right 07/2007   Cholelithiasis    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  He had external beam radiation in August 2001 with 54 Gy in 30 fractions on RTOG 9802 protocol.   He declined chemotherapy.    Seizure (HCC)    from history of oligodendroglioma   Past Surgical History:  Procedure Laterality Date   ANKLE SURGERY     right, x 3   brain biopsy  1997, 2001   Patient Active Problem List   Diagnosis Date Noted   Acute stroke of medulla oblongata (HCC) 11/26/2022   Hyponatremia 11/24/2022   Acute CVA (cerebrovascular accident) (HCC) 11/23/2022   Polycythemia 11/23/2022   Elevated LFTs 04/12/2022   Mixed hyperlipidemia 04/12/2022   Acute cough 04/12/2022   Syncope 04/03/2022   Elevated troponin 04/03/2022   Avascular necrosis of bone of hip, left (HCC) 04/03/2022   Allergic rhinitis 04/03/2022   Eustachian tube dysfunction, left 10/06/2021   Bloody diarrhea 11/17/2017   History of oligodendroglioma of brain 03/22/2017   Status post radiation therapy 03/22/2017   GERD (gastroesophageal reflux disease) 11/11/2016   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   Complex partial seizure (HCC)  12/21/2012   Seasonal allergies 07/08/2008   SMOKELESS TOBACCO ABUSE 08/02/2007   Intracranial tumor (HCC) 11/17/1999   Seizure disorder (HCC) 01/16/1997    ONSET DATE: Referral date: 12/06/2022   ONSET: 11/23/22  REFERRING DIAG: I63.9 (ICD-10-CM) - CVA (cerebral vascular accident)  THERAPY DIAG:  Muscle weakness (generalized)  Other lack of coordination  Other disturbances of skin sensation  Rationale for Evaluation and Treatment: Rehabilitation OT Discharge from Hospital states: "Patient will benefit from ongoing skilled OT services in outpatient setting to continue to advance functional skills in the area of BADL, iADL, Vocation, and Reduce care partner burden."  SUBJECTIVE STATEMENT: Patient's nose bleeds stopped after he stopped his blood thinner.  He returns today s/p cruise with family to [place] last week.  He fell this morning in the kitchen and scraped his L arm on an open drawer due to leftover luggage on the floor and his dog getting in the way.   He had a follow up with MD the Thursday before his cruise and progress is as expected.  He is not cleared to drive on the public roads d/t seizure in April but has been able to drive around the farm.   He is able to feel differences in temperatures of surfaces ie) cold table and warm skin   Pt accompanied by: significant other - Self   PERTINENT HISTORY:   42 yo male  with history of oligodendroglioma s/p Postoperative radiotherapy (XRT), complex partial seizures, AVN left hip who was admitted to [facility] on 11/23/2022 with 1 day history of left-sided numbness with left lower extremity weakness followed by sudden onset of dizziness and diarrhea.  He was found to have acute infarct in medulla as well as unchanged nonenhancing right temporal lobe mass.    Principal Problem:   Acute stroke of medulla oblongata (HCC) Active Problems:   Seizure disorder from history of oligodendroglioma   GERD (gastroesophageal reflux disease)    Allergic rhinitis Hyponatremia Polycythemia   Oligodendroglioma Left temporal; bx in 12/1999.  He had external beam radiation in August 2001 with 54 Gy in 30 fractions on RTOG 9802 protocol.   He declined chemotherapy.   Ankle surgery x 3   PRECAUTIONS: Fall and Other: currently not driving, h/o seizures (managed by medication)  WEIGHT BEARING RESTRICTIONS: No  PAIN:  Are you having pain? No   FALLS: Has patient fallen in last 6 months? YES  At evaluation - patient reports stumbling 1x by getting his foot hooked up in some laundry in the floor and stumbling against the wall.  Patient fell morning of 01/05/23 prior to therapy appointment.  He scraped his L arm on an open drawer due to leftover luggage on the floor and his dog getting in the way.   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  Currently off work x 6 months, and Leisure: hunting, fishing, watching kids sports   PATIENT GOALS: To be able to do more FM stuff with his left hand, for things to be easier   OBJECTIVE:   HAND DOMINANCE: Right  ADLs:  "At inpatient rehab admission, patient required min to mod assist with basic ADL tasks and min assist with mobility. He exhibited mild dysarthria and mild deficits in higher level tasks. He has had improvement in activity tolerance, balance, postural control as well as ability to compensate for deficits. He is able to complete ADL tasks at modified independent level. He was independent for transfers and is able to ambulate 300' with use of LLE Swedish Knee  cage independently in supervised setting and with supervision in community setting. His speech intelligibility has improved and he is able to utilize strategies for clarity as well as swallowing at modified independent level.  He is tolerating regular diet without any signs of aspiration."  Overall ADLs: Patient reports MI with ADLs although buttons do get frustrating. Transfers/ambulation related to ADLs: MI without knee brace at home Eating: R handed so able to hold fork in L hand to cut food with R hand Grooming: electric razor/trimmer UB Dressing: MI with some assistance for buttons LB Dressing: MI - managed jeans button/zipper and belt today Toileting: MI Bathing: MI - showers in standing generally, he did sit on shower seat today when he got a nose bleed Tub Shower transfers: walk in shower Equipment: Shower seat with back  IADLs:            Shopping: currently relying on spouse Light housekeeping: currently relying on spouse due to standing for long periods  of time being difficult due to L hip discomfort Meal Prep: currently relying on spouse - did not have activity tolerance this morning to try and cook eggs Community mobility: depends on spouse - RE: Driving - MD told him to wait 2 weeks and then to drive around the farm to practice before resuming driving Medication management: wife puts them in a medicine box due to coordination difficulties Financial management: shares with spouse - mostly via phone Handwriting: 100% legible - R handed  MOBILITY STATUS: Independent and wears knee brace for long distance but not in the house  POSTURE COMMENTS:  No Significant postural limitations Sitting balance:  WFL  ACTIVITY TOLERANCE: Activity tolerance: LIMITED -- Had cleaned off the stove and washed something to make eggs this morning but needed to sit and rest.  FUNCTIONAL OUTCOME MEASURES (Eval): Upper Extremity Functional Scale (UEFS): 68 as rated by patient and 48 by spouse  0  to 80 score range of the UEFS -- Lower scores indicate that the subject is reporting increased difficulty with the activities as a result of their upper limb condition. While higher scores indicate less severity.   FOTO Severity: Slight (Intake FS: 60)    UPPER EXTREMITY ROM:    A/ROM - WNL L UE moves slower than R UE but achieves same ROM  UPPER EXTREMITY MMT:     R UE - 5/5 L UE - 4 to 4+/5  HAND FUNCTION: Grip strength: Right: 166.8 lbs; Left: 101.1  lbs, Lateral pinch: Right: 30 lbs, Left: 24 lbs, and 3 point pinch: Right: 30 lbs, Left: 20 lbs Hospital DC last week:  R Dominant Hand:  Average: 169.33 lbs L Nondominant (hemiparetic) Hand: Average: 91.67 lbs  COORDINATION: 9 Hole Peg test: Right: 33.94  sec; Left: 102.15 sec Box and Blocks:  Right 46 blocks, Left 27 blocks Used R hand to help L hand on occasion See goals for update from 01/05/23  SENSATION: Light touch: Impaired  Hot/Cold: Impaired   Patient was able to feel light touch on all fingers at least 1/3 times but had difficulty with median nerve distribution with lightest touch and would benefit from more formal testing.  He self reports hot and cold not being as distinct on his L side but noting improvements everyday.   EDEMA: No swelling  MUSCLE TONE: WFL  COGNITION: Overall cognitive status: Within functional limits for tasks assessed  VISION: Subjective report: No issues Baseline vision: No visual deficits Visual history:  N/A  VISION ASSESSMENT: WFL  PERCEPTION: WFL  PRAXIS: WFL  Evaluation OBSERVATIONS: Patient arrives today with his wife with L knee brace in place and ambulates without AE from lobby to therapy gym.   No loss of balance noted during mobility tasks during short observations of mobility in clinic. He demonstrates full ROM of BUE although L UE moves at a slower rate than R UE and has obvious coordination deficits as noted by time to compete 9 hole peg test at 3x the time for R UE.     TODAY'S TREATMENT:  Therapeutic Activities -  Retested goals for FM skills with improvements noted in all areas.  -Able to fasten/unfasten 4 buttons in 56 seconds with ability to fasten buttons in 42 secs compared to baseline at evaluation 1:28 to button/unbutton (1:20 to fasten & < 10 seconds to unfasten)  -Improved 9 hole peg test to 85 seconds from 102 seconds.  -Improved Box and Blocks test by 5 blocks from 27 to 32 blocks.  -Stereognosis - Patient correctly identified 5/10 items including: block, dice, ball (but not as golf ball), jack and nut.  He missed identifying a peg, key, checker, wing nut and nickel.  He does have some temperature discrimination ie) knows that an item is metal vs plastic.   Explored some individual finger ROM and strengthening with rubber bands to improve motor coordination and ongoing need to work on [employer] tasks ie) manipulating pen, small objects, coins etc and locating items in rice or other material to work on improving sensation and discrimination.  PATIENT EDUCATION: Education details: finger ROM (with rubber bands) and coordination  Person educated: Patient Education method: Explanation, demonstration, Verbal cues Education comprehension: verbalized understanding, return demonstration and needs further education  HOME EXERCISE PROGRAM: 12/15/22 -- Coordination handouts with images Access Code: [email or web address] URL: [email or web address] Date: 12/15/2022 * Finger Pinch and Pull with Putty  - 1 x daily - 3 sets - 10 reps  GOALS: Goals reviewed with patient? Yes - generally reviewed with specific goals to be reviewed at next visit    SHORT TERM GOALS: Target date: 01/05/23  Will improve buttoning 4 buttons in less than 1 minute  and improve score on UEFI from little difficulty to no difficulty.  Baseline: 1:28   button/unbutton (1:20 to fasten & < 10 seconds to unfasten) Goal status: MET 01/05/23 fasten 42 secs - 56 seconds total time    2.  Patient will complete nine-hole peg with use of L UE in less than 1 minute.  Baseline: 102 seconds Goal status: IN PROGRESS 01/04/23: 85 seconds  3.  Pt will be able to place at least 32 blocks using left hand with completion of Box and Blocks test.  Baseline: 27 blocks Goal status: MET 01/04/23: 32 blocks   4.  Following sensory testing (2 pt discrimination and stereognosis 12/15/22) Patient will identify 5/5 items with vision occluded in L hand. Baseline: 0/3 items, Light touch 6-8 mm fingertips and self reported decreased awareness of hot/cold. Goal status: REVISED 01/05/23 - 3/6 items   5.  Patient will report no difficulty with tasks from UEFI test including lifting grocery bags, preparing food, and helping with IADLS in standing for improved UEFI score x 5 points. Baseline: Patient 68/80 & Spouse 48/80 Goal status: IN PROGRESS  6.  Patient will have increased activity tolerance for standing 15 minutes while moving heavy objects (15+ pounds) to simulate work tasks. Baseline: Patient had to sit after getting dishes ready to make eggs this morning Goal status: MET 01/05/23 - Reports carrying 2 - 50 lb salt bags, one in each hand with plastic handle from house to the pool - down a driveway 30-50 yards without rest break.   LONG TERM GOALS: Target date: 02/04/23  Patient will complete nine-hole peg with use of L in 45 seconds or less.  Baseline: 102 seconds Goal status: IN PROGRESS 01/05/23 - 85 seconds  2.  Pt will be able to place at least >35 blocks using left hand with completion of Box and Blocks test.  Baseline: 27 blocks  Goal status: IN PROGRESS 01/05/23 - 32 blocks  3.  Patient will report no difficulty with tasks from UEFI test for score of > 75/80 for minimal residual deficits/difficulties. Baseline: Patient 68/80 & Spouse 48/80 Goal status:  IN PROGRESS  4.  Patient will identify 3 compensatory techniques to be MI with any sensory compensatory techniques for any residual deficits to minimize risk of self-injury. Baseline: Minor light touch deficits and temperature deficits Goal status: IN PROGRESS  5.  Patient will be MI with all HEPs for high level coordination and sustained activity tolerance. Baseline: Aware of some exercises from hospital  Goal status: IN PROGRESS   6.  Patient will have increased activity tolerance for standing 30 minutes while moving heavy objects (30+ pounds) to simulate work tasks and/or complete chores at home as appropriate (laundry/meal prep etc) Baseline:  Goal status: MET Carried 2 - 50 lb salt bags, one in each hand with plastic handle from house to the pool - down a driveway 30-50 yards without rest break.   ASSESSMENT:  CLINICAL IMPRESSION: Pt demonstrates good motivation and participation in therapy as needed to progress towards goals. Pt benefiting from comparison use of RUE with task completion. Continue to work on coordination and sensation of LUE.   PERFORMANCE DEFICITS: in functional skills including ADLs, IADLs, coordination, dexterity, proprioception, sensation, Fine motor control, endurance, and UE functional use,   IMPAIRMENTS: are limiting patient from ADLs, IADLs, work, leisure, and social participation.   CO-MORBIDITIES: has co-morbidities such as h.o seizures and  unchanged nonenhancing right temporal lobe mass.that affects occupational performance. Patient will benefit from skilled OT to address above impairments and improve overall function.  REHAB POTENTIAL: Excellent  PLAN:  OT FREQUENCY: 1-2x/week  OT DURATION: 8 weeks  PLANNED INTERVENTIONS: self care/ADL training, therapeutic exercise, therapeutic activity, neuromuscular re-education, functional mobility training, patient/family education, energy conservation, coping strategies training, and DME and/or AE  instructions  RECOMMENDED OTHER SERVICES: Patient scheduled for PT/ST evaluations on Friday  CONSULTED AND AGREED WITH PLAN OF CARE: Patient and family member/caregiver  PLAN FOR NEXT SESSION:   Begin progression of FM/UE tasks for small motor coordination as noted by > 1 minutes still with 9 hole peg test ie) in hand manipulation (try wooden vs plastic pegs etc).   Continue with stereognosis activities ie) hidden items in water, rice, beans or simply a bag of items and ask him to find a specific items.   Victorino Sparrow, OT 01/05/2023, 12:14 PM

## 2023-01-05 NOTE — Patient Instructions (Signed)
Access Code: [email or web address] URL: [email or web address] Date: 01/05/2023 Prepared by: Amada Kingfisher  Patient Education - Check for Safety (8 page handout with main points re: removing clutter from the floor per information pasted below).

## 2023-01-05 NOTE — Therapy (Signed)
OUTPATIENT PHYSICAL THERAPY NEURO TREATMENT   Patient Name: Corey Chang MRN: [id number] DOB:1980-09-21, 42 y.o., male Today's Date: 01/05/2023   PCP: Eden Emms, MD REFERRING PROVIDER: Charlton Amor, PA-C  END OF SESSION:  PT End of Session - 01/05/23 1020     Visit Number 4    Number of Visits 9   8 + eval   Date for PT Re-Evaluation 02/11/23   pushed out to accomodate scheduling w/ OT   Authorization Type [employer]    PT Start Time 1019    PT Stop Time 1100    PT Time Calculation (min) 41 min    Equipment Utilized During Treatment Gait belt   Left knee cage   Activity Tolerance Patient tolerated treatment well    Behavior During Therapy WFL for tasks assessed/performed;Impulsive              Past Medical History:  Diagnosis Date   Allergic rhinitis 04/22/1997   Ankle fracture, right 07/2007   Cholelithiasis    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  He had external beam radiation in August 2001 with 54 Gy in 30 fractions on RTOG 9802 protocol.   He declined chemotherapy.    Seizure (HCC)    from history of oligodendroglioma   Past Surgical History:  Procedure Laterality Date   ANKLE SURGERY     right, x 3   brain biopsy  1997, 2001   Patient Active Problem List   Diagnosis Date Noted   Acute stroke of medulla oblongata (HCC) 11/26/2022   Hyponatremia 11/24/2022   Acute CVA (cerebrovascular accident) (HCC) 11/23/2022   Polycythemia 11/23/2022   Elevated LFTs 04/12/2022   Mixed hyperlipidemia 04/12/2022   Acute cough 04/12/2022   Syncope 04/03/2022   Elevated troponin 04/03/2022   Avascular necrosis of bone of hip, left (HCC) 04/03/2022   Allergic rhinitis 04/03/2022   Eustachian tube dysfunction, left 10/06/2021   Bloody diarrhea 11/17/2017   History of oligodendroglioma of brain 03/22/2017   Status post radiation therapy 03/22/2017   GERD (gastroesophageal reflux disease) 11/11/2016   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   Complex partial seizure (HCC) 12/21/2012   Seasonal allergies 07/08/2008   SMOKELESS TOBACCO ABUSE 08/02/2007   Intracranial tumor (HCC) 11/17/1999   Seizure disorder (HCC) 01/16/1997    ONSET DATE: 11/26/2022 (CVA)  REFERRING DIAG: I63.9 (ICD-10-CM) - Cerebral infarction, unspecified  THERAPY DIAG:  Muscle weakness (generalized)  Other disturbances of skin sensation  Other abnormalities of gait and mobility  Other lack of coordination  Hemiplegia and hemiparesis following cerebral infarction affecting left non-dominant side (HCC)  Rationale for Evaluation and Treatment: Rehabilitation  SUBJECTIVE:  SUBJECTIVE STATEMENT: Patient carried 50 lb bags up a hill to his backyard for his pool yesterday and the day before and thinks he is going to have to do the same today which makes him sore, but otherwise he is having no pain.  His nose bleeds have subsided about 3 days after stopping the blood thinner.  Pt wearing L knee cage to this session.  Pt accompanied by: significant other-wife, [name]; pt states he should be cleared to return to driving in the next week  PERTINENT HISTORY: L hip AVN (ortho not planning to address unless significant limitations in function-followed by [facility]), ankle ORIF x3, oligodendroglioma s/p Postoperative radiotherapy (XRT), complex partial seizures  Right hand dominant male who was admitted to [facility] on 11/23/2022 with 1 day history of left-sided numbness with left lower extremity weakness followed by sudden onset of dizziness and diarrhea.  He was found to have acute infarct in medulla.  PAIN:  Are you having pain? 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 2. Unchanged nonenhancing right temporal lobe mass, previously reported to represent an oligodendroglioma.  COGNITION: Overall cognitive status: Within functional limits for tasks assessed   SENSATION: Light touch: Impaired ; left side feels more dull compared to right  COORDINATION: LE RAMS:  dysmetric Heel-to-shin:  limited by L Hip AVN  EDEMA:  None noted in BLE  MUSCLE TONE: None noted during functional assessment  POSTURE: No Significant postural limitations  LOWER EXTREMITY ROM:     Active  Right Eval Left Eval  Hip flexion Grossly WFL  Hip extension   Hip abduction   Hip adduction   Hip internal rotation   Hip external rotation   Knee flexion   Knee extension   Ankle dorsiflexion   Ankle plantarflexion    Ankle inversion    Ankle eversion     (Blank rows = not tested)  LOWER EXTREMITY MMT:    MMT Right Eval Left Eval  Hip flexion 5/5 4+/5  Hip extension    Hip abduction " "  Hip adduction " "  Hip internal rotation    Hip external rotation    Knee flexion " "  Knee extension " "  Ankle dorsiflexion " "  Ankle plantarflexion     Ankle inversion    Ankle eversion    (Blank rows = not tested)  BED MOBILITY:  Sit to supine Complete Independence Supine to sit Complete Independence Rolling to Right Complete Independence Rolling to Left Complete Independence  TRANSFERS: Assistive device utilized: None  Sit to stand: Complete Independence Stand to sit:  Complete Independence Chair to chair: Complete Independence  GAIT: Gait pattern: decreased hip/knee flexion- Left and genu recurvatum- Left Distance walked: various clinic distances Assistive device utilized: None Level of assistance: SBA Comments: Pt has one instance of left LE buckling stating his hip was catching and hurting.  FUNCTIONAL TESTS:  5 times sit to stand: 6.72 seconds no UE support, intermittent touch of back of legs to chair, no LOB 6 minute walk test: To be assessed. Functional gait assessment: To be assessed.  PATIENT SURVEYS:  FOTO not captured at intake.  TODAY'S TREATMENT:                                                                                                                              -Treadmill training x8 minutes progressing from 0.7-1. and 4% incline for gait training focused on reciprocal stride length, width of BOS, and LLE clearance using auditory feedback from scuff of toe. RAMP:  Level of Assistance: SBA Assistive device utilized: None Ramp Comments: x3; discussed quad control on descent with squeezing during left stance.  CURB:  Level of Assistance: SBA Assistive device utilized: None Curb Comments: Practiced x1 up with good and down with bad, pt is safe and independent so recommended this pattern at home.  Had pt trial opposite pattern x2 w/ LLE unstable with controlling adequate knee bend for step off.  GAIT: Gait pattern: decreased hip/knee flexion- Left, genu recurvatum- Left, and poor foot clearance- Left Distance walked: 600' Assistive device utilized: None Level of assistance: SBA Comments: Knee  cage removed following treadmill.  Performed consecutive to treadmill for carryover of mechanics and fluidity of gait.  Patient only has x2 instances of left foot scuff.  He has good stride length and lateral stability.  He has increased knee hyperextension with increased distances so recommended wearing knee cage when doing increased activity or managing compliant/unlevel surfaces at home.  Encouraged practicing quad squeeze during stance.  -lateral tilt board in // bars holding level x2 rounds w/ moderate progressed to minimum sway > lateral taps progressing from BUE to no UE support in progressed ROM > alternating 8" midline cone taps w/ BUE support  PATIENT EDUCATION: Education details: Continue HEP.  Verbally added heel taps w/ return demo-will add picture to HEP next visit. Person educated: Patient Education method: Chief Technology Officer Education comprehension: verbalized understanding  HOME EXERCISE PROGRAM: Access Code: [email or web address] URL: [email or web address] Date: 12/15/2022 Prepared by: Peter Congo  Exercises - Staggered Sit-to-Stand  - 1 x daily - 7 x weekly - 3 sets - 10 reps - Standing Terminal Knee Extension with Resistance  - 1 x daily - 7 x weekly - 3 sets - 10 reps - Staggered Stance Squat  - 1 x daily - 7 x weekly - 3 sets - 10 reps  GOALS: Goals reviewed with patient? Yes  SHORT TERM GOALS: Target date: 01/07/2023  Pt will ambulate greater than or equal to 1300 feet on with  LRAD and mod I for improved cardiovascular endurance and BLE strength.  Baseline:  1114 ft (5/29) Goal status: INITIAL  2.  Pt will improve FGA to 23/30 for decreased fall risk  Baseline: 20/30 (5/29) Goal status: INITIAL  LONG TERM GOALS: Target date: 02/04/2023  Pt will be independent with strength and balance HEP to improve mobility and functional independence. Baseline: To be established. Goal status: INITIAL  2.  Pt will ambulate greater than or equal to 1500 feet  on with LRAD and mod I for improved cardiovascular endurance and BLE strength.  Baseline: 1114 ft (5/29) Goal status: INITIAL  3.  Pt will improve FGA to 26/30 for decreased fall risk  Baseline: 20/30 (5/29) Goal status: INITIAL  4.  Pt will ambulate >/=500 feet with LRAD over unlevel indoor and outdoor surfaces demonstrating improved left knee control at no more than modI level of assist to promote household and community access. Baseline: various level clinic distances w/ left genu recurvatum wearing left knee cage Goal status: INITIAL  ASSESSMENT:  CLINICAL IMPRESSION: The majority of skilled session today focused on gait training with assessment of carryover from treadmill to overground with moderate carryover noticed to level indoor surfaces.  He continues to have knee hyperextension with prolonged distance due to quad fatigue and it was recommended that when doing yardwork or other high level physical task that he wear the knee cage to prevent re-enforcement of poor movement patterns over long periods of time.  Assessment of STGs overlooked this session in favor of tasks mentioned, but PT to assess at next session.  He continues to benefit from skilled PT to address ambulatory mechanics and high level dynamic balance for improved safety and functional mobility.   OBJECTIVE IMPAIRMENTS: Abnormal gait, decreased activity tolerance, decreased balance, decreased coordination, decreased endurance, decreased knowledge of use of DME, difficulty walking, decreased strength, and impaired sensation.   ACTIVITY LIMITATIONS: carrying, lifting, squatting, and locomotion level  PARTICIPATION LIMITATIONS: driving, community activity, and occupation  PERSONAL FACTORS: Fitness, Past/current experiences, Profession, Sex, Transportation, and 1-2 comorbidities: Left hip AVN and right oligodendroglioma  are also affecting patient's functional outcome.   REHAB POTENTIAL: Excellent  CLINICAL DECISION  MAKING: Evolving/moderate complexity  EVALUATION COMPLEXITY: Moderate  PLAN:  PT FREQUENCY: 1x/week  PT DURATION: 8 weeks  PLANNED INTERVENTIONS: Therapeutic exercises, Therapeutic activity, Neuromuscular re-education, Balance training, Gait training, Patient/Family education, Self Care, Stair training, Vestibular training, DME instructions, Manual therapy, and Re-evaluation  PLAN FOR NEXT SESSION: ASSESS STGs!  add to HEP for L NMR/strength, coordination tasks, L quad strengthening, step ups, eccentric step downs-add to HEP, hurdles, uneven surfaces, blaze pods   Sadie Haber, PT, DPT 01/05/2023, 11:02 AM

## 2023-01-06 ENCOUNTER — Other Ambulatory Visit (HOSPITAL_COMMUNITY): Payer: Self-pay

## 2023-01-12 ENCOUNTER — Ambulatory Visit: Payer: BC Managed Care – PPO | Admitting: Physical Therapy

## 2023-01-12 ENCOUNTER — Encounter: Payer: Self-pay | Admitting: Occupational Therapy

## 2023-01-12 ENCOUNTER — Encounter: Payer: Self-pay | Admitting: Physical Therapy

## 2023-01-12 ENCOUNTER — Ambulatory Visit: Payer: BC Managed Care – PPO | Admitting: Occupational Therapy

## 2023-01-12 DIAGNOSIS — M6281 Muscle weakness (generalized): Secondary | ICD-10-CM | POA: Diagnosis not present

## 2023-01-12 DIAGNOSIS — I69354 Hemiplegia and hemiparesis following cerebral infarction affecting left non-dominant side: Secondary | ICD-10-CM

## 2023-01-12 DIAGNOSIS — R278 Other lack of coordination: Secondary | ICD-10-CM

## 2023-01-12 DIAGNOSIS — R208 Other disturbances of skin sensation: Secondary | ICD-10-CM

## 2023-01-12 NOTE — Patient Instructions (Signed)
Scapular Retraction: Abduction / Extension (Prone)    Lie with arms out from sides 90. Pinch shoulder blades together and raise arms a few inches from floor. Repeat __10__ times per set.  Do __2__ sessions per day.   Prone Plank (Eccentric)    On toes and elbows, pull abdomen in while stabilizing trunk. Slowly lower downward without arching back. Hold 5 sec _5-10__ reps per set, _2__ sets per day   Quadruped Arm / Leg Lift (Alternating)    Kneel on a soft surface to cushion knees. Place pillow under sound foot and shin. Raise opposite arm and leg. Do not arch neck or back. Repeat on other side. Hold _3-5___ seconds each side. Repeat __10__ times. Do __2__ sessions per day.    Wall Push-Up    With feet and hands shoulder-width apart, lean into wall, then push away from wall. Repeat _10___ times.  Do __2__ sessions per day.

## 2023-01-12 NOTE — Therapy (Signed)
OUTPATIENT PHYSICAL THERAPY NEURO TREATMENT   Patient Name: Corey Chang MRN: [id number] DOB:01/03/1981, 42 y.o., male Today's Date: 01/12/2023   PCP: Eden Emms, MD REFERRING PROVIDER: Charlton Amor, PA-C  END OF SESSION:  PT End of Session - 01/12/23 1106     Visit Number 5    Number of Visits 9   8 + eval   Date for PT Re-Evaluation 02/11/23   pushed out to accomodate scheduling w/ OT   Authorization Type [employer]    PT Start Time 1103    PT Stop Time 1146    PT Time Calculation (min) 43 min    Equipment Utilized During Treatment Gait belt   Left knee cage   Activity Tolerance Patient tolerated treatment well    Behavior During Therapy WFL for tasks assessed/performed;Impulsive              Past Medical History:  Diagnosis Date   Allergic rhinitis 04/22/1997   Ankle fracture, right 07/2007   Cholelithiasis    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  He had external beam radiation in August 2001 with 54 Gy in 30 fractions on RTOG 9802 protocol.   He declined chemotherapy.    Seizure (HCC)    from history of oligodendroglioma   Past Surgical History:  Procedure Laterality Date   ANKLE SURGERY     right, x 3   brain biopsy  1997, 2001   Patient Active Problem List   Diagnosis Date Noted   Acute stroke of medulla oblongata (HCC) 11/26/2022   Hyponatremia 11/24/2022   Acute CVA (cerebrovascular accident) (HCC) 11/23/2022   Polycythemia 11/23/2022   Elevated LFTs 04/12/2022   Mixed hyperlipidemia 04/12/2022   Acute cough 04/12/2022   Syncope 04/03/2022   Elevated troponin 04/03/2022   Avascular necrosis of bone of hip, left (HCC) 04/03/2022   Allergic rhinitis 04/03/2022   Eustachian tube dysfunction, left 10/06/2021   Bloody diarrhea 11/17/2017   History of oligodendroglioma of brain 03/22/2017   Status post radiation therapy 03/22/2017   GERD (gastroesophageal reflux disease) 11/11/2016   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   Complex partial seizure (HCC) 12/21/2012   Seasonal allergies 07/08/2008   SMOKELESS TOBACCO ABUSE 08/02/2007   Intracranial tumor (HCC) 11/17/1999   Seizure disorder (HCC) 01/16/1997    ONSET DATE: 11/26/2022 (CVA)  REFERRING DIAG: I63.9 (ICD-10-CM) - Cerebral infarction, unspecified  THERAPY DIAG:  Other lack of coordination  Other disturbances of skin sensation  Muscle weakness (generalized)  Hemiplegia and hemiparesis following cerebral infarction affecting left non-dominant side (HCC)  Rationale for Evaluation and Treatment: Rehabilitation  SUBJECTIVE:  SUBJECTIVE STATEMENT: Patient thinks he overdid something yesterday, but is unsure what.  He has had resolving left flank pain since yesterday afternoon.  Pt wearing L knee cage to this session.  Pt accompanied by: significant other-wife, [name]; pt states he should be cleared to return to driving in the next week  PERTINENT HISTORY: L hip AVN (ortho not planning to address unless significant limitations in function-followed by [facility]), ankle ORIF x3, oligodendroglioma s/p Postoperative radiotherapy (XRT), complex partial seizures  Right hand dominant male who was admitted to [facility] on 11/23/2022 with 1 day history of left-sided numbness with left lower extremity weakness followed by sudden onset of dizziness and diarrhea.  He was found to have acute infarct in medulla.  PAIN:  Are you having pain? 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 2. Unchanged nonenhancing right temporal lobe mass, previously reported to represent an oligodendroglioma.  COGNITION: Overall cognitive status: Within functional limits for tasks assessed   SENSATION: Light touch: Impaired ; left side feels more dull compared to right  COORDINATION: LE RAMS:  dysmetric Heel-to-shin:  limited by L Hip AVN  EDEMA:  None noted in BLE  MUSCLE TONE: None noted during functional assessment  POSTURE: No Significant postural limitations  LOWER EXTREMITY ROM:     Active  Right Eval Left Eval  Hip flexion Grossly WFL  Hip extension   Hip abduction   Hip adduction   Hip internal rotation   Hip external rotation   Knee flexion   Knee extension   Ankle dorsiflexion   Ankle plantarflexion    Ankle inversion    Ankle eversion     (Blank rows = not tested)  LOWER EXTREMITY MMT:    MMT Right Eval Left Eval  Hip flexion 5/5 4+/5  Hip extension    Hip abduction " "  Hip adduction " "  Hip internal rotation    Hip external rotation    Knee flexion " "  Knee extension " "  Ankle dorsiflexion " "  Ankle plantarflexion     Ankle inversion    Ankle eversion    (Blank rows = not tested)  BED MOBILITY:  Sit to supine Complete Independence Supine to sit Complete Independence Rolling to Right Complete Independence Rolling to Left Complete Independence  TRANSFERS: Assistive device utilized: None  Sit to stand: Complete Independence Stand to sit: Complete Independence Chair to chair: Complete Independence  GAIT:  Gait pattern: decreased hip/knee flexion- Left and genu recurvatum- Left Distance walked: various clinic distances Assistive device utilized: None Level of assistance: SBA Comments: Pt has one instance of left LE buckling stating his hip was catching and hurting.  FUNCTIONAL TESTS:  5 times sit to stand: 6.72 seconds no UE support, intermittent touch of back of legs to chair, no LOB 6 minute walk test: To be assessed. Functional gait assessment: To be assessed.  PATIENT SURVEYS:  FOTO not captured at intake.  TODAY'S TREATMENT:                                                                                                                              - :  1066 ft no AD w/ left knee immobilizer -FGA:  [facility] PT Assessment - 01/12/23 1120       Functional Gait  Assessment   Gait assessed  Yes    Gait Level Surface Walks 20 ft in less than 7 sec but greater than 5.5 sec, uses assistive device, slower speed, mild gait deviations, or deviates 6-10 in outside of the 12 in walkway width.    Change in Gait Speed Able to smoothly change walking speed without loss of balance or gait deviation. Deviate no more than 6 in outside of the 12 in walkway width.    Gait with Horizontal Head Turns Performs head turns smoothly with slight change in gait velocity (eg, minor disruption to smooth gait path), deviates 6-10 in outside 12 in walkway width, or uses an assistive device.    Gait with Vertical Head Turns Performs task with slight change in gait velocity (eg, minor disruption to smooth gait path),  deviates 6 - 10 in outside 12 in walkway width or uses assistive device    Gait and Pivot Turn Pivot turns safely within 3 sec and stops quickly with no loss of balance.    Step Over Obstacle Is able to step over 2 stacked shoe boxes taped together (9 in total height) without changing gait speed. No evidence of imbalance.    Gait with Narrow Base of Support Ambulates less than 4 steps heel to toe or cannot perform without assistance.   2 steps   Gait with Eyes Closed Walks 20 ft, slow speed, abnormal gait pattern, evidence for imbalance, deviates 10-15 in outside 12 in walkway width. Requires more than 9 sec to ambulate 20 ft.    Ambulating Backwards Walks 20 ft, uses assistive device, slower speed, mild gait deviations, deviates 6-10 in outside 12 in walkway width.    Steps Alternating feet, must use rail.    Total Score 20    FGA comment: 20/30 = moderate fall risk            -Added heel taps to HEP  -Holding level on firm BOSU x1 minute > manual provided perturbations for ankle and hip strategy x 2 minutes > pt practicing unsupport weight shifting variable reps > soft BOSU  LLE step ups w/ contralateral hip drive Z61 w/ BUE support  PATIENT EDUCATION: Education details: Continue HEP - Added heel taps to HEP. Person educated: Patient Education method: Chief Technology Officer Education comprehension: verbalized understanding  HOME EXERCISE PROGRAM: Access Code: [email or web address] URL: [email or web address] Date: 12/15/2022 Prepared by: Peter Congo  Exercises - Staggered Sit-to-Stand  - 1 x daily - 7 x weekly - 3 sets - 10 reps - Standing Terminal Knee Extension with Resistance  - 1 x daily - 7 x weekly - 3 sets - 10 reps - Staggered Stance Squat  - 1 x daily - 7 x weekly - 3 sets - 10 reps - Forward Step Down with Heel Tap and Counter Support  - 1 x daily - 7 x weekly - 3 sets - 10 reps  GOALS: Goals reviewed with patient? Yes  SHORT TERM GOALS: Target date:  01/07/2023  Pt will ambulate greater than or equal to 1300 feet on with LRAD and mod I for improved cardiovascular endurance and BLE strength.  Baseline:  1114 ft (5/29); 1066 ft (6/26) Goal status: NOT MET  2.  Pt will improve FGA to 23/30 for decreased fall risk  Baseline: 20/30 (5/29); 20/30 (6/26) Goal status: NOT MET  LONG TERM GOALS: Target date: 02/04/2023  Pt will be independent with strength and balance HEP to improve mobility and functional independence. Baseline: To be established. Goal status: INITIAL  2.  Pt will ambulate greater than or equal to 1300 feet on with LRAD and mod I for improved cardiovascular endurance and BLE strength.  Baseline: 1114 ft (5/29); 1066 ft (6/26) Goal status: REVISED-6/26  3.  Pt will improve FGA to 24/30 for decreased fall risk  Baseline: 20/30 (5/29); 20/30 (6/26) Goal status: REVISED-6/26  4.  Pt will ambulate >/=500 feet with LRAD over unlevel indoor and outdoor surfaces demonstrating improved left knee control at no more than modI level of assist to promote household and community access. Baseline: various level clinic distances w/ left genu recurvatum wearing left knee cage Goal status: INITIAL  ASSESSMENT:  CLINICAL IMPRESSION: Assessed STGs this session with patient remaining relatively unchanged on metrics used.  He ambulates 1066 feet during with knee cage donned which patient feels slows him down.  His FGA score remained unchanged from evaluation at 20/30 indicating a moderate fall risk.  Added heel taps to his HEP formally this visit as patient did well with these at visit prior.  Continued to address left ankle and hip strategy and dynamic SLS at end of session with patient tolerating well.  Will continue per POC as patient remains motivated and demonstrates potential to progress.  OBJECTIVE IMPAIRMENTS: Abnormal gait, decreased activity tolerance, decreased balance, decreased coordination, decreased endurance,  decreased knowledge of use of DME, difficulty walking, decreased strength, and impaired sensation.   ACTIVITY LIMITATIONS: carrying, lifting, squatting, and locomotion level  PARTICIPATION LIMITATIONS: driving, community activity, and occupation  PERSONAL FACTORS: Fitness, Past/current experiences, Profession, Sex, Transportation, and 1-2 comorbidities: Left hip AVN and right oligodendroglioma  are also affecting patient's functional outcome.   REHAB POTENTIAL: Excellent  CLINICAL DECISION MAKING: Evolving/moderate complexity  EVALUATION COMPLEXITY: Moderate  PLAN:  PT FREQUENCY: 1x/week  PT DURATION: 8 weeks  PLANNED INTERVENTIONS: Therapeutic exercises, Therapeutic activity, Neuromuscular re-education, Balance training, Gait training, Patient/Family education, Self Care, Stair training, Vestibular training, DME instructions, Manual therapy, and Re-evaluation  PLAN FOR NEXT SESSION: add to HEP for L NMR/strength, coordination tasks, L quad strengthening, step ups, hurdles, uneven  surfaces, blaze pods  Sadie Haber, PT, DPT 01/12/2023, 11:49 AM

## 2023-01-12 NOTE — Therapy (Signed)
OUTPATIENT OCCUPATIONAL THERAPY NEURO TREATMENT  Patient Name: Corey Chang MRN: [id number] DOB:04-19-1981, 42 y.o., male Today's Date: 01/12/2023  PCP: Gweneth Dimitri, MD REFERRING PROVIDER: Charlton Amor, PA-C  END OF SESSION:  OT End of Session - 01/12/23 1024     Visit Number 6    Number of Visits 17    Date for OT Re-Evaluation 02/04/23    Authorization Type [employer] - Auth not Reqd    OT Start Time 1018    OT Stop Time 1100    OT Time Calculation (min) 42 min    Equipment Utilized During Treatment Testing material    Activity Tolerance Patient tolerated treatment well    Behavior During Therapy [facility] for tasks assessed/performed              Past Medical History:  Diagnosis Date   Allergic rhinitis 04/22/1997   Ankle fracture, right 07/2007   Cholelithiasis    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  He had external beam radiation in August 2001 with 54 Gy in 30 fractions on RTOG 9802 protocol.   He declined chemotherapy.    Seizure (HCC)    from history of oligodendroglioma   Past Surgical History:  Procedure Laterality Date   ANKLE SURGERY     right, x 3   brain biopsy  1997, 2001   Patient Active Problem List   Diagnosis Date Noted   Acute stroke of medulla oblongata (HCC) 11/26/2022   Hyponatremia 11/24/2022   Acute CVA (cerebrovascular accident) (HCC) 11/23/2022   Polycythemia 11/23/2022   Elevated LFTs 04/12/2022   Mixed hyperlipidemia 04/12/2022   Acute cough 04/12/2022   Syncope 04/03/2022   Elevated troponin 04/03/2022   Avascular necrosis of bone of hip, left (HCC) 04/03/2022   Allergic rhinitis 04/03/2022   Eustachian tube dysfunction, left 10/06/2021   Bloody diarrhea 11/17/2017   History of oligodendroglioma of brain 03/22/2017   Status post radiation therapy 03/22/2017   GERD (gastroesophageal reflux disease) 11/11/2016   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   Complex partial seizure (HCC)  12/21/2012   Seasonal allergies 07/08/2008   SMOKELESS TOBACCO ABUSE 08/02/2007   Intracranial tumor (HCC) 11/17/1999   Seizure disorder (HCC) 01/16/1997    ONSET DATE: Referral date: 12/06/2022   ONSET: 11/23/22  REFERRING DIAG: I63.9 (ICD-10-CM) - CVA (cerebral vascular accident)  THERAPY DIAG:  Other lack of coordination  Other disturbances of skin sensation  Muscle weakness (generalized)  Hemiplegia and hemiparesis following cerebral infarction affecting left non-dominant side (HCC)  Rationale for Evaluation and Treatment: Rehabilitation OT Discharge from Hospital states: "Patient will benefit from ongoing skilled OT services in outpatient setting to continue to advance functional skills in the area of BADL, iADL, Vocation, and Reduce care partner burden."  SUBJECTIVE STATEMENT: No recent falls. I'm taking Allieve (pt encouraged to discuss w/ MD)  Pt accompanied by: significant other - Self   PERTINENT HISTORY:   42 yo male with history of oligodendroglioma s/p Postoperative radiotherapy (XRT), complex partial seizures, AVN left hip who was admitted to [facility] on 11/23/2022 with 1 day history of left-sided numbness with left lower extremity weakness followed by sudden onset of dizziness and diarrhea.  He was found to have acute infarct in medulla as well as unchanged nonenhancing right temporal lobe mass.    Principal Problem:   Acute stroke of medulla oblongata (HCC) Active Problems:   Seizure disorder from history of oligodendroglioma   GERD (gastroesophageal reflux disease)  Allergic rhinitis Hyponatremia Polycythemia   Oligodendroglioma Left temporal; bx in 12/1999.  He had external beam radiation in August 2001 with 54 Gy in 30 fractions on RTOG 9802 protocol.   He declined chemotherapy.   Ankle surgery x 3   PRECAUTIONS: Fall and Other: currently not driving, h/o seizures (managed by medication)  WEIGHT BEARING RESTRICTIONS: No  PAIN:  Are you having pain? Lt  side of trunk 5/10 - feels "tight"   FALLS: Has patient fallen in last 6 months? YES  At evaluation - patient reports stumbling 1x by getting his foot hooked up in some laundry in the floor and stumbling against the wall.  Patient fell morning of 01/05/23 prior to therapy appointment.  He scraped his L arm on an open drawer due to leftover luggage on the floor and his dog getting in the way.   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  Currently off work x 6 months, and Leisure: hunting, fishing, watching kids sports   PATIENT GOALS: To be able to do more FM stuff with his left hand, for things to be easier   OBJECTIVE:   HAND DOMINANCE: Right  ADLs:  "At inpatient rehab admission, patient required min to mod assist with basic ADL tasks and min assist with mobility. He exhibited mild dysarthria and mild deficits in higher level tasks. He has had improvement in activity tolerance, balance, postural control as well as ability to compensate for deficits. He is able to complete ADL tasks at modified independent level. He was independent for transfers and is able to ambulate 300' with use of LLE Swedish Knee cage independently in supervised setting and with supervision in community setting. His speech intelligibility has improved and he is able to utilize strategies for clarity as well as swallowing at modified independent level.  He is tolerating regular diet without any signs of aspiration."  Overall ADLs: Patient reports MI with ADLs although buttons do get  frustrating. Transfers/ambulation related to ADLs: MI without knee brace at home Eating: R handed so able to hold fork in L hand to cut food with R hand Grooming: electric razor/trimmer UB Dressing: MI with some assistance for buttons LB Dressing: MI - managed jeans button/zipper and belt today Toileting: MI Bathing: MI - showers in standing generally, he did sit on shower seat today when he got a nose bleed Tub Shower transfers: walk in shower Equipment: Shower seat with back  IADLs:            Shopping: currently relying on spouse Light housekeeping: currently relying on spouse due to standing for long periods of time being difficult due to L hip discomfort Meal Prep: currently relying on spouse - did not have activity tolerance this morning to try and cook eggs Community mobility: depends on spouse - RE: Driving - MD told him to wait 2 weeks and then [place] before resuming driving Medication management: wife puts them in a medicine box due to coordination difficulties Financial management: shares with spouse - mostly via phone Handwriting: 100% legible - R handed  MOBILITY STATUS: Independent and wears knee brace for long distance but not in the house  POSTURE COMMENTS:  No Significant postural limitations Sitting balance:  WFL  ACTIVITY TOLERANCE: Activity tolerance: LIMITED -- Had cleaned off the stove and washed something to make eggs this morning but needed to sit and rest.  FUNCTIONAL OUTCOME MEASURES (Eval): Upper Extremity Functional Scale (UEFS): 68 as rated by patient and 48 by spouse  0 to 80 score range of the UEFS -- Lower scores indicate that the subject is reporting increased difficulty with the activities as a result of their upper limb condition. While higher scores indicate less severity.   FOTO Severity: Slight (Intake FS: 60)    UPPER EXTREMITY ROM:    A/ROM - WNL L UE moves slower than R UE but achieves same ROM  UPPER  EXTREMITY MMT:     R UE - 5/5 L UE - 4 to 4+/5  HAND FUNCTION: Grip strength: Right: 166.8 lbs; Left: 101.1  lbs, Lateral pinch: Right: 30 lbs, Left: 24 lbs, and 3 point pinch: Right: 30 lbs, Left: 20 lbs Hospital DC last week:  R Dominant Hand:  Average: 169.33 lbs L Nondominant (hemiparetic) Hand: Average: 91.67 lbs  COORDINATION: 9 Hole Peg test: Right: 33.94  sec; Left: 102.15 sec Box and Blocks:  Right 46 blocks, Left 27 blocks Used R hand to help L hand on occasion See goals for update from 01/05/23  SENSATION: Light touch: Impaired  Hot/Cold: Impaired   Patient was able to feel light touch on all fingers at least 1/3 times but had difficulty with median nerve distribution with lightest touch and would benefit from more formal testing.  He self reports hot and cold not being as distinct on his L side but noting improvements everyday.   EDEMA: No swelling  MUSCLE TONE: WFL  COGNITION: Overall cognitive status: Within functional limits for tasks assessed  VISION: Subjective report: No issues Baseline vision: No visual deficits Visual history:  N/A  VISION ASSESSMENT: WFL  PERCEPTION: WFL  PRAXIS: WFL  Evaluation OBSERVATIONS: Patient arrives today with his wife with L knee brace in place and ambulates without AE from lobby to therapy gym.   No loss of balance noted during mobility tasks during short observations of mobility in clinic. He demonstrates full ROM of BUE although L UE moves at a slower rate than R UE and has obvious coordination deficits as noted by time to compete 9 hole peg test at 3x the time for R UE.    TODAY'S TREATMENT:                                                                                                                               Noted decreased high level sh flexion and scapula stability Lt side. Pt also winging more Lt scapula. Pt issued HEP for neuro re-education to promote scapula strengthening and stabilization - see pt  instructions for details.  Pt placing medium sized pegs in pegboard manipulating up to 3 at a time in palm for fingertip to/from palm translation with mod to max difficulty and drops.    PATIENT EDUCATION: Education details: scapula stabilization/strengthening HEP   Person educated: Patient Education method: Explanation, demonstration, Verbal cues, handouts Education comprehension: verbalized understanding, return demonstration and needs further education  HOME EXERCISE PROGRAM: 12/15/22 -- Coordination handouts with images Access Code: [email or web address] URL: [email or web address] Date: 12/15/2022 * Finger Pinch and Pull with Putty  - 1 x daily - 3 sets - 10 reps  01/12/23: scapula stabilization/strengthening HEP   GOALS: Goals reviewed with patient? Yes - generally reviewed with specific goals to be reviewed at next visit    SHORT TERM GOALS: Target date: 01/05/23  Will improve buttoning 4 buttons in less than 1 minute  and improve score on UEFI from little difficulty to no difficulty.  Baseline: 1:28  button/unbutton (1:20 to fasten & < 10 seconds to unfasten) Goal status: MET 01/05/23 fasten 42 secs - 56 seconds total time    2.  Patient will complete nine-hole peg with use of L UE in less than 1 minute.  Baseline: 102 seconds Goal status: IN PROGRESS 01/04/23: 85 seconds  3.  Pt will be able to place at least 32 blocks using left hand with completion of Box and Blocks test.  Baseline: 27 blocks Goal status: MET 01/04/23: 32 blocks   4.  Following sensory testing (2 pt discrimination and stereognosis 12/15/22) Patient will identify 5/5 items with vision occluded in L hand. Baseline: 0/3 items, Light touch 6-8 mm fingertips and self reported decreased awareness of hot/cold. Goal status: REVISED 01/05/23 - 3/6 items   5.  Patient will report no difficulty with tasks from UEFI test including lifting grocery bags, preparing food, and helping with IADLS in standing for  improved UEFI score x 5 points. Baseline: Patient 68/80 & Spouse 48/80 Goal status: IN PROGRESS  6.  Patient will have increased activity tolerance for standing 15 minutes while moving heavy objects (15+ pounds) to simulate work tasks. Baseline: Patient had to sit after getting dishes ready to make eggs this morning Goal status: MET 01/05/23 - Reports carrying 2 - 50 lb salt bags, one in each hand with plastic handle from house to the pool - down a driveway 30-50 yards without rest break.   LONG TERM GOALS: Target date: 02/04/23  Patient will complete nine-hole peg with use of L in 45 seconds or less.  Baseline: 102 seconds Goal status: IN PROGRESS 01/05/23 - 85 seconds  2.  Pt will be able to place at least >35 blocks using left hand with completion of Box and Blocks test.  Baseline: 27 blocks Goal status: IN PROGRESS 01/05/23 - 32 blocks  3.  Patient will report no difficulty with tasks from UEFI test for score of > 75/80 for minimal residual deficits/difficulties. Baseline: Patient 68/80 & Spouse 48/80 Goal status: IN PROGRESS  4.  Patient will identify 3 compensatory techniques to be MI with any sensory compensatory techniques for any residual deficits to minimize risk of self-injury. Baseline: Minor light touch deficits and temperature deficits Goal status: IN PROGRESS  5.  Patient will be MI with all HEPs for high level coordination and sustained activity tolerance. Baseline: Aware of some exercises from hospital  Goal status: IN PROGRESS   6.  Patient will have increased activity tolerance for standing 30 minutes while moving heavy objects (30+ pounds) to simulate work tasks and/or complete chores  at home as appropriate (laundry/meal prep etc) Baseline:  Goal status: MET Carried 2 - 50 lb salt bags, one in each hand with plastic handle from house to the pool - down a driveway 30-50 yards without rest break.   ASSESSMENT:  CLINICAL IMPRESSION: Pt demonstrates good  motivation and participation in therapy as needed to progress towards goals. Pt benefiting from comparison use of RUE with task completion. Continue to work on coordination and sensation of LUE.   PERFORMANCE DEFICITS: in functional skills including ADLs, IADLs, coordination, dexterity, proprioception, sensation, Fine motor control, endurance, and UE functional use,   IMPAIRMENTS: are limiting patient from ADLs, IADLs, work, leisure, and social participation.   CO-MORBIDITIES: has co-morbidities such as h.o seizures and  unchanged nonenhancing right temporal lobe mass.that affects occupational performance. Patient will benefit from skilled OT to address above impairments and improve overall function.  REHAB POTENTIAL: Excellent  PLAN:  OT FREQUENCY: 1-2x/week  OT DURATION: 8 weeks  PLANNED INTERVENTIONS: self care/ADL training, therapeutic exercise, therapeutic activity, neuromuscular re-education, functional mobility training, patient/family education, energy conservation, coping strategies training, and DME and/or AE instructions  RECOMMENDED OTHER SERVICES: Patient scheduled for PT/ST evaluations on Friday  CONSULTED AND AGREED WITH PLAN OF CARE: Patient and family member/caregiver  PLAN FOR NEXT SESSION:   Review HEP for scapula. Stretches for Lt trunk  Begin progression of FM/UE tasks for small motor coordination as noted by > 1 minutes still with 9 hole peg test ie) in hand manipulation (try wooden vs plastic pegs etc).   Continue with stereognosis activities ie) hidden items in water, rice, beans or simply a bag of items and ask him to find a specific items.   Sheran Lawless, OT 01/12/2023, 10:25 AM

## 2023-01-12 NOTE — Patient Instructions (Signed)
Access Code: [email or web address] URL: [email or web address] Date: 01/12/2023 Prepared by: Camille Bal  Exercises - Staggered Sit-to-Stand  - 1 x daily - 7 x weekly - 3 sets - 10 reps - Standing Terminal Knee Extension with Resistance  - 1 x daily - 7 x weekly - 3 sets - 10 reps - Staggered Stance Squat  - 1 x daily - 7 x weekly - 3 sets - 10 reps - Forward Step Down with Heel Tap and Counter Support  - 1 x daily - 7 x weekly - 3 sets - 10 reps

## 2023-01-19 ENCOUNTER — Ambulatory Visit: Payer: BC Managed Care – PPO | Attending: Physician Assistant | Admitting: Physical Therapy

## 2023-01-19 ENCOUNTER — Encounter: Payer: Self-pay | Admitting: Physical Therapy

## 2023-01-19 ENCOUNTER — Ambulatory Visit: Payer: BC Managed Care – PPO | Admitting: Occupational Therapy

## 2023-01-19 DIAGNOSIS — I69354 Hemiplegia and hemiparesis following cerebral infarction affecting left non-dominant side: Secondary | ICD-10-CM | POA: Diagnosis present

## 2023-01-19 DIAGNOSIS — R2689 Other abnormalities of gait and mobility: Secondary | ICD-10-CM | POA: Insufficient documentation

## 2023-01-19 DIAGNOSIS — R29818 Other symptoms and signs involving the nervous system: Secondary | ICD-10-CM | POA: Insufficient documentation

## 2023-01-19 DIAGNOSIS — R208 Other disturbances of skin sensation: Secondary | ICD-10-CM | POA: Diagnosis present

## 2023-01-19 DIAGNOSIS — R278 Other lack of coordination: Secondary | ICD-10-CM | POA: Insufficient documentation

## 2023-01-19 DIAGNOSIS — R29898 Other symptoms and signs involving the musculoskeletal system: Secondary | ICD-10-CM | POA: Insufficient documentation

## 2023-01-19 DIAGNOSIS — M6281 Muscle weakness (generalized): Secondary | ICD-10-CM | POA: Diagnosis present

## 2023-01-19 NOTE — Therapy (Unsigned)
OUTPATIENT OCCUPATIONAL THERAPY NEURO TREATMENT  Patient Name: Corey Chang MRN: [id number] DOB:[birth year], 42 y.o., male Today's Date: 01/19/2023  PCP: Gweneth Dimitri, MD REFERRING PROVIDER: Charlton Amor, PA-C  END OF SESSION:  OT End of Session - 01/19/23 1101     Visit Number 7    Number of Visits 17    Date for OT Re-Evaluation 02/04/23    Authorization Type [employer] - Auth not Reqd    OT Start Time 1103    OT Stop Time 1147    OT Time Calculation (min) 44 min    Equipment Utilized During Treatment FM items    Activity Tolerance Patient tolerated treatment well    Behavior During Therapy [facility] for tasks assessed/performed              Past Medical History:  Diagnosis Date   Allergic rhinitis 04/22/1997   Ankle fracture, right 07/2007   Cholelithiasis    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  He had external beam radiation in August 2001 with 54 Gy in 30 fractions on RTOG 9802 protocol.   He declined chemotherapy.    Seizure (HCC)    from history of oligodendroglioma   Past Surgical History:  Procedure Laterality Date   ANKLE SURGERY     right, x 3   brain biopsy  1997, 2001   Patient Active Problem List   Diagnosis Date Noted   Acute stroke of medulla oblongata (HCC) 11/26/2022   Hyponatremia 11/24/2022   Acute CVA (cerebrovascular accident) (HCC) 11/23/2022   Polycythemia 11/23/2022   Elevated LFTs 04/12/2022   Mixed hyperlipidemia 04/12/2022   Acute cough 04/12/2022   Syncope 04/03/2022   Elevated troponin 04/03/2022   Avascular necrosis of bone of hip, left (HCC) 04/03/2022   Allergic rhinitis 04/03/2022   Eustachian tube dysfunction, left 10/06/2021   Bloody diarrhea 11/17/2017   History of oligodendroglioma of brain 03/22/2017   Status post radiation therapy 03/22/2017   GERD (gastroesophageal reflux disease) 11/11/2016   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   Complex partial seizure (HCC) 12/21/2012    Seasonal allergies 07/08/2008   SMOKELESS TOBACCO ABUSE 08/02/2007   Intracranial tumor (HCC) 11/17/1999   Seizure disorder (HCC) 01/16/1997    ONSET DATE: Referral date: 12/06/2022   ONSET: 11/23/22  REFERRING DIAG: I63.9 (ICD-10-CM) - CVA (cerebral vascular accident)  THERAPY DIAG:  Other lack of coordination  Other disturbances of skin sensation  Hemiplegia and hemiparesis following cerebral infarction affecting left non-dominant side (HCC)  Other symptoms and signs involving the musculoskeletal system  Rationale for Evaluation and Treatment: Rehabilitation OT Discharge from Hospital states: "Patient will benefit from ongoing skilled OT services in outpatient setting to continue to advance functional skills in the area of BADL, iADL, Vocation, and Reduce care partner burden."  SUBJECTIVE STATEMENT: No recent falls. I'm taking Allieve (pt encouraged to discuss w/ MD)  Pt accompanied by: significant other - Self   PERTINENT HISTORY:   42 yo male with history of oligodendroglioma s/p Postoperative radiotherapy (XRT), complex partial seizures, AVN left hip who was admitted to [facility] on 11/23/2022 with 1 day history of left-sided numbness with left lower extremity weakness followed by sudden onset of dizziness and diarrhea.  He was found to have acute infarct in medulla as well as unchanged nonenhancing right temporal lobe mass.    Principal Problem:   Acute stroke of medulla oblongata (HCC) Active Problems:   Seizure disorder from history of oligodendroglioma   GERD (  gastroesophageal reflux disease)   Allergic rhinitis Hyponatremia Polycythemia   Oligodendroglioma Left temporal; bx in 12/1999.  He had external beam radiation in August 2001 with 54 Gy in 30 fractions on RTOG 9802 protocol.   He declined chemotherapy.   Ankle surgery x 3   PRECAUTIONS: Fall and Other: currently not driving, h/o seizures (managed by medication)  WEIGHT BEARING RESTRICTIONS: No  PAIN:  Are you  having pain? Lt side of trunk 5/10 - feels "tight"   FALLS: Has patient fallen in last 6 months? YES  At evaluation - patient reports stumbling 1x by getting his foot hooked up in some laundry in the floor and stumbling against the wall.  Patient fell morning of 01/05/23 prior to therapy appointment.  He scraped his L arm on an open drawer due to leftover luggage on the floor and his dog getting in the way.   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  Currently off work x 6 months, and Leisure: hunting, fishing, watching kids sports   PATIENT GOALS: To be able to do more FM stuff with his left hand, for things to be easier   OBJECTIVE:   HAND DOMINANCE: Right  ADLs:  "At inpatient rehab admission, patient required min to mod assist with basic ADL tasks and min assist with mobility. He exhibited mild dysarthria and mild deficits in higher level tasks. He has had improvement in activity tolerance, balance, postural control as well as ability to compensate for deficits. He is able to complete ADL tasks at modified independent level. He was independent for transfers and is able to ambulate 300' with use of LLE Swedish Knee cage independently in supervised setting and with supervision in community setting. His speech intelligibility has improved and he is able to utilize strategies for clarity as well as swallowing at modified independent level.  He is tolerating regular diet without any signs of aspiration."  Overall ADLs: Patient reports MI with ADLs although buttons  do get frustrating. Transfers/ambulation related to ADLs: MI without knee brace at home Eating: R handed so able to hold fork in L hand to cut food with R hand Grooming: electric razor/trimmer UB Dressing: MI with some assistance for buttons LB Dressing: MI - managed jeans button/zipper and belt today Toileting: MI Bathing: MI - showers in standing generally, he did sit on shower seat today when he got a nose bleed Tub Shower transfers: walk in shower Equipment: Shower seat with back  IADLs:            Shopping: currently relying on spouse Light housekeeping: currently relying on spouse due to standing for long periods of time being difficult due to L hip discomfort Meal Prep: currently relying on spouse - did not have activity tolerance this morning to try and cook eggs Community mobility: depends on spouse - RE: Driving - MD told him to wait 2 weeks and then [place] before resuming driving Medication management: wife puts them in a medicine box due to coordination difficulties Financial management: shares with spouse - mostly via phone Handwriting:  100% legible - R handed  MOBILITY STATUS: Independent and wears knee brace for long distance but not in the house  POSTURE COMMENTS:  No Significant postural limitations Sitting balance:  WFL  ACTIVITY TOLERANCE: Activity tolerance: LIMITED -- Had cleaned off the stove and washed something to make eggs this morning but needed to sit and rest.  FUNCTIONAL OUTCOME MEASURES (Eval): Upper Extremity Functional Scale (UEFS): 68 as rated by patient and 48 by spouse  0 to 80 score range of the UEFS -- Lower scores indicate that the subject is reporting increased difficulty with the activities as a result of their upper limb condition. While higher scores indicate less severity.   FOTO Severity: Slight (Intake FS: 60)    UPPER EXTREMITY ROM:    A/ROM - WNL L UE moves slower than R UE but achieves same  ROM  UPPER EXTREMITY MMT:     R UE - 5/5 L UE - 4 to 4+/5  HAND FUNCTION: Grip strength: Right: 166.8 lbs; Left: 101.1  lbs, Lateral pinch: Right: 30 lbs, Left: 24 lbs, and 3 point pinch: Right: 30 lbs, Left: 20 lbs Hospital DC last week:  R Dominant Hand:  Average: 169.33 lbs L Nondominant (hemiparetic) Hand: Average: 91.67 lbs  COORDINATION: 9 Hole Peg test: Right: 33.94  sec; Left: 102.15 sec Box and Blocks:  Right 46 blocks, Left 27 blocks Used R hand to help L hand on occasion See goals for update from 01/05/23  SENSATION: Light touch: Impaired  Hot/Cold: Impaired   Patient was able to feel light touch on all fingers at least 1/3 times but had difficulty with median nerve distribution with lightest touch and would benefit from more formal testing.  He self reports hot and cold not being as distinct on his L side but noting improvements everyday.   EDEMA: No swelling  MUSCLE TONE: WFL  COGNITION: Overall cognitive status: Within functional limits for tasks assessed  VISION: Subjective report: No issues Baseline vision: No visual deficits Visual history:  N/A  VISION ASSESSMENT: WFL  PERCEPTION: WFL  PRAXIS: WFL  Evaluation OBSERVATIONS: Patient arrives today with his wife with L knee brace in place and ambulates without AE from lobby to therapy gym.   No loss of balance noted during mobility tasks during short observations of mobility in clinic. He demonstrates full ROM of BUE although L UE moves at a slower rate than R UE and has obvious coordination deficits as noted by time to compete 9 hole peg test at 3x the time for R UE.    TODAY'S TREATMENT:                                                                                                                               Noted decreased high level sh flexion and scapula stability Lt side. Pt also winging more Lt scapula. Pt issued HEP for neuro re-education to promote scapula strengthening and stabilization -  see  pt instructions for details.   Pt placing medium sized pegs in pegboard manipulating up to 3 at a time in palm for fingertip to/from palm translation with mod to max difficulty and drops.    PATIENT EDUCATION: Education details: scapula stabilization/strengthening HEP   Person educated: Patient Education method: Explanation, demonstration, Verbal cues, handouts Education comprehension: verbalized understanding, return demonstration and needs further education  HOME EXERCISE PROGRAM: 12/15/22 -- Coordination handouts with images Access Code: [email or web address] URL: [email or web address] Date: 12/15/2022 * Finger Pinch and Pull with Putty  - 1 x daily - 3 sets - 10 reps  01/12/23: scapula stabilization/strengthening HEP   GOALS: Goals reviewed with patient? Yes - generally reviewed with specific goals to be reviewed at next visit    SHORT TERM GOALS: Target date: 01/05/23  Will improve buttoning 4 buttons in less than 1 minute  and improve score on UEFI from little difficulty to no difficulty.  Baseline: 1:28  button/unbutton (1:20 to fasten & < 10 seconds to unfasten) Goal status: MET 01/05/23 fasten 42 secs - 56 seconds total time    2.  Patient will complete nine-hole peg with use of L UE in less than 1 minute.  Baseline: 102 seconds Goal status: IN PROGRESS 01/04/23: 85 seconds  3.  Pt will be able to place at least 32 blocks using left hand with completion of Box and Blocks test.  Baseline: 27 blocks Goal status: MET 01/04/23: 32 blocks   4.  Following sensory testing (2 pt discrimination and stereognosis 12/15/22) Patient will identify 5/5 items with vision occluded in L hand. Baseline: 0/3 items, Light touch 6-8 mm fingertips and self reported decreased awareness of hot/cold. Goal status: REVISED 01/05/23 - 3/6 items   5.  Patient will report no difficulty with tasks from UEFI test including lifting grocery bags, preparing food, and helping with IADLS in standing  for improved UEFI score x 5 points. Baseline: Patient 68/80 & Spouse 48/80 Goal status: IN PROGRESS  6.  Patient will have increased activity tolerance for standing 15 minutes while moving heavy objects (15+ pounds) to simulate work tasks. Baseline: Patient had to sit after getting dishes ready to make eggs this morning Goal status: MET 01/05/23 - Reports carrying 2 - 50 lb salt bags, one in each hand with plastic handle from house to the pool - down a driveway 30-50 yards without rest break.   LONG TERM GOALS: Target date: 02/04/23  Patient will complete nine-hole peg with use of L in 45 seconds or less.  Baseline: 102 seconds Goal status: IN PROGRESS 01/05/23 - 85 seconds  2.  Pt will be able to place at least >35 blocks using left hand with completion of Box and Blocks test.  Baseline: 27 blocks Goal status: IN PROGRESS 01/05/23 - 32 blocks  3.  Patient will report no difficulty with tasks from UEFI test for score of > 75/80 for minimal residual deficits/difficulties. Baseline: Patient 68/80 & Spouse 48/80 Goal status: IN PROGRESS  4.  Patient will identify 3 compensatory techniques to be MI with any sensory compensatory techniques for any residual deficits to minimize risk of self-injury. Baseline: Minor light touch deficits and temperature deficits Goal status: IN PROGRESS  5.  Patient will be MI with all HEPs for high level coordination and sustained activity tolerance. Baseline: Aware of some exercises from hospital  Goal status: IN PROGRESS   6.  Patient will have increased activity tolerance for standing 30 minutes while moving heavy objects (30+ pounds) to  simulate work tasks and/or complete chores at home as appropriate (laundry/meal prep etc) Baseline:  Goal status: MET Carried 2 - 50 lb salt bags, one in each hand with plastic handle from house to the pool - down a driveway 30-50 yards without rest break.   ASSESSMENT:  CLINICAL IMPRESSION: Pt demonstrates good  motivation and participation in therapy as needed to progress towards goals. Pt benefiting from comparison use of RUE with task completion. Continue to work on coordination and sensation of LUE.   PERFORMANCE DEFICITS: in functional skills including ADLs, IADLs, coordination, dexterity, proprioception, sensation, Fine motor control, endurance, and UE functional use,   IMPAIRMENTS: are limiting patient from ADLs, IADLs, work, leisure, and social participation.   CO-MORBIDITIES: has co-morbidities such as h.o seizures and  unchanged nonenhancing right temporal lobe mass.that affects occupational performance. Patient will benefit from skilled OT to address above impairments and improve overall function.  REHAB POTENTIAL: Excellent  PLAN:  OT FREQUENCY: 1-2x/week  OT DURATION: 8 weeks  PLANNED INTERVENTIONS: self care/ADL training, therapeutic exercise, therapeutic activity, neuromuscular re-education, functional mobility training, patient/family education, energy conservation, coping strategies training, and DME and/or AE instructions  RECOMMENDED OTHER SERVICES: Patient scheduled for PT/ST evaluations on Friday  CONSULTED AND AGREED WITH PLAN OF CARE: Patient and family member/caregiver  PLAN FOR NEXT SESSION:   Review HEP for scapula. Stretches for Lt trunk  Begin progression of FM/UE tasks for small motor coordination as noted by > 1 minutes still with 9 hole peg test ie) in hand manipulation (try wooden vs plastic pegs etc).   Continue with stereognosis activities ie) hidden items in water, rice, beans or simply a bag of items and ask him to find a specific items.   Victorino Sparrow, OT 01/19/2023, 12:20 PM

## 2023-01-19 NOTE — Therapy (Signed)
OUTPATIENT PHYSICAL THERAPY NEURO TREATMENT   Patient Name: Corey Chang MRN: [id number] DOB:1981-07-08, 42 y.o., male Today's Date: 01/19/2023   PCP: Eden Emms, MD REFERRING PROVIDER: Charlton Amor, PA-C  END OF SESSION:  PT End of Session - 01/19/23 1026     Visit Number 6    Number of Visits 9   8 + eval   Date for PT Re-Evaluation 02/11/23   pushed out to accomodate scheduling w/ OT   Authorization Type BLUE CROSS BLUE [employer]IELD    PT Start Time 1019    PT Stop Time 1104    PT Time Calculation (min) 45 min    Equipment Utilized During Treatment Gait belt   Left knee cage   Activity Tolerance Patient tolerated treatment well    Behavior During Therapy WFL for tasks assessed/performed;Impulsive              Past Medical History:  Diagnosis Date   Allergic rhinitis 04/22/1997   Ankle fracture, right 07/2007   Cholelithiasis    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  He had external beam radiation in August 2001 with 54 Gy in 30 fractions on RTOG 9802 protocol.   He declined chemotherapy.    Seizure (HCC)    from history of oligodendroglioma   Past Surgical History:  Procedure Laterality Date   ANKLE SURGERY     right, x 3   brain biopsy  1997, 2001   Patient Active Problem List   Diagnosis Date Noted   Acute stroke of medulla oblongata (HCC) 11/26/2022   Hyponatremia 11/24/2022   Acute CVA (cerebrovascular accident) (HCC) 11/23/2022   Polycythemia 11/23/2022   Elevated LFTs 04/12/2022   Mixed hyperlipidemia 04/12/2022   Acute cough 04/12/2022   Syncope 04/03/2022   Elevated troponin 04/03/2022   Avascular necrosis of bone of hip, left (HCC) 04/03/2022   Allergic rhinitis 04/03/2022   Eustachian tube dysfunction, left 10/06/2021   Bloody diarrhea 11/17/2017   History of oligodendroglioma of brain 03/22/2017   Status post radiation therapy 03/22/2017   GERD (gastroesophageal reflux disease) 11/11/2016   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   Complex partial seizure (HCC) 12/21/2012   Seasonal allergies 07/08/2008   SMOKELESS TOBACCO ABUSE 08/02/2007   Intracranial tumor (HCC) 11/17/1999   Seizure disorder (HCC) 01/16/1997    ONSET DATE: 11/26/2022 (CVA)  REFERRING DIAG: I63.9 (ICD-10-CM) - Cerebral infarction, unspecified  THERAPY DIAG:  Other lack of coordination  Other disturbances of skin sensation  Muscle weakness (generalized)  Hemiplegia and hemiparesis following cerebral infarction affecting left non-dominant side (HCC)  Other abnormalities of gait and mobility  Rationale for Evaluation and Treatment: Rehabilitation  SUBJECTIVE:  SUBJECTIVE STATEMENT: Patient has some intermittent left hip pain.  He feels restricted by the left knee cage.  Even when he walks without the knee cage he feels it there and feels like it is changing how he walks.  Pt accompanied by: significant other-wife, [name]; pt states he should be cleared to return to driving in the next week  PERTINENT HISTORY: L hip AVN (ortho not planning to address unless significant limitations in function-followed by [facility]), ankle ORIF x3, oligodendroglioma s/p Postoperative radiotherapy (XRT), complex partial seizures  Right hand dominant male who was admitted to [facility] on 11/23/2022 with 1 day history of left-sided numbness with left lower extremity weakness followed by sudden onset of dizziness and diarrhea.  He was found to have acute infarct in medulla.  PAIN:  Are you having pain? 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 2. Unchanged nonenhancing right temporal lobe mass, previously reported to represent an oligodendroglioma.  COGNITION: Overall cognitive status: Within functional limits for tasks assessed   SENSATION: Light touch: Impaired ; left side feels more dull compared to right  COORDINATION: LE RAMS:  dysmetric Heel-to-shin:  limited by L Hip AVN  EDEMA:  None noted in BLE  MUSCLE TONE: None noted during functional assessment  POSTURE: No Significant postural limitations  LOWER EXTREMITY ROM:     Active  Right Eval Left Eval  Hip flexion Grossly WFL  Hip extension   Hip abduction   Hip adduction   Hip internal rotation   Hip external rotation   Knee flexion   Knee extension   Ankle dorsiflexion   Ankle plantarflexion    Ankle inversion    Ankle eversion     (Blank rows = not tested)  LOWER EXTREMITY MMT:    MMT Right Eval Left Eval  Hip flexion 5/5 4+/5  Hip extension    Hip abduction " "  Hip adduction " "  Hip internal rotation    Hip external rotation    Knee flexion " "  Knee extension " "  Ankle dorsiflexion " "  Ankle plantarflexion    Ankle inversion    Ankle eversion    (Blank rows = not tested)  BED MOBILITY:  Sit to supine Complete Independence Supine to  sit Complete Independence Rolling to Right Complete Independence Rolling to Left Complete Independence  TRANSFERS: Assistive device utilized: None  Sit to stand: Complete Independence Stand to sit: Complete Independence Chair to chair: Complete Independence  GAIT: Gait pattern: decreased hip/knee flexion- Left and genu recurvatum- Left Distance walked: various clinic distances Assistive device utilized:  None Level of assistance: SBA Comments: Pt has one instance of left LE buckling stating his hip was catching and hurting.  FUNCTIONAL TESTS:  5 times sit to stand: 6.72 seconds no UE support, intermittent touch of back of legs to chair, no LOB 6 minute walk test: To be assessed. Functional gait assessment: To be assessed.  PATIENT SURVEYS:  FOTO not captured at intake.  TODAY'S TREATMENT:                                                                                                                              -Treadmill training x10 minutes w/ BUE support for forward (x2 mins, 2% incline 1.23mph), side stepping w/ unilateral UE support (x2 minutes each side, 0.7 mph, no incline, cues for improved step size), retro-stepping (x4 minutes, 3% incline at 0.60mph) -Patient negotiates 4x6" stairs x5 progressing from BUE support to none, pt goes up stairs using LLE to lead and down letting RLE lead, some jerking during descent that improves with repetition -Had patient try chirp wheel in supine as he tells therapist he has been having some off and on neck pain.  Education on how to use it and various sizes, having someone else help mobilize the soft tissue in prone, and general safety (not rolling over sensitive neck areas/vascular areas/etc) -8" hurdles no UE support using LLE in stance cued for "soft knee" for quad control 6 Blaze pods on random one color taps setting for improved LUE coordination and LLE dynamic SLS and quad control.  Performed on 1 minute intervals with 30 second rest  periods.  Pt requires SBA guarding. Round 1:  4 pods linear on firm ground w/ 2 overhead on mirror outside BOS setup.  15 hits. Round 2:  same as prior setup.  21 hits. Round 3:  same as prior setup.  18 hits. Notable errors/deficits:  Difficulty coordinating LUE when not visualizing it.  PATIENT EDUCATION: Education details: Continue HEP and wean from knee cage as long as left knee is not hyperextension is not worse that day and he is not fatigued.  Provided printout on chirp wheel for patient neck stiffness and pain that he has after doing more activity. Person educated: Patient Education method: Chief Technology Officer Education comprehension: verbalized understanding  HOME EXERCISE PROGRAM: Access Code: [email or web address] URL: [email or web address] Date: 12/15/2022 Prepared by: Peter Congo  Exercises - Staggered Sit-to-Stand  - 1 x daily - 7 x weekly - 3 sets - 10 reps - Standing Terminal Knee Extension with Resistance  - 1 x daily - 7 x weekly - 3 sets - 10 reps - Staggered Stance Squat  - 1 x daily - 7 x weekly - 3 sets - 10 reps - Forward Step Down with Heel Tap and Counter Support  - 1 x daily - 7 x weekly - 3 sets - 10 reps  GOALS: Goals reviewed with patient? Yes  SHORT TERM GOALS: Target date: 01/07/2023  Pt will  ambulate greater than or equal to 1300 feet on with LRAD and mod I for improved cardiovascular endurance and BLE strength.  Baseline:  1114 ft (5/29); 1066 ft (6/26) Goal status: NOT MET  2.  Pt will improve FGA to 23/30 for decreased fall risk  Baseline: 20/30 (5/29); 20/30 (6/26) Goal status: NOT MET  LONG TERM GOALS: Target date: 02/04/2023  Pt will be independent with strength and balance HEP to improve mobility and functional independence. Baseline: To be established. Goal status: INITIAL  2.  Pt will ambulate greater than or equal to 1300 feet on with LRAD and mod I for improved cardiovascular endurance and BLE strength.  Baseline:  1114 ft (5/29); 1066 ft (6/26) Goal status: REVISED-6/26  3.  Pt will improve FGA to 24/30 for decreased fall risk  Baseline: 20/30 (5/29); 20/30 (6/26) Goal status: REVISED-6/26  4.  Pt will ambulate >/=500 feet with LRAD over unlevel indoor and outdoor surfaces demonstrating improved left knee control at no more than modI level of assist to promote household and community access. Baseline: various level clinic distances w/ left genu recurvatum wearing left knee cage Goal status: INITIAL  ASSESSMENT:  CLINICAL IMPRESSION: Focus of session today on advancing left quad control in primarily dynamic conditions.  His left hyperextension appeared better controlled today so educated patient that on days like today he can wean out of the brace being mindful of good mechanics.  He is overall progressing well, but is continuing to struggle with decreased proprioceptive and kinesthetic awareness of the LUE due to lingering decreased sensation.  PT to continue POC.  OBJECTIVE IMPAIRMENTS: Abnormal gait, decreased activity tolerance, decreased balance, decreased coordination, decreased endurance, decreased knowledge of use of DME, difficulty walking, decreased strength, and impaired sensation.   ACTIVITY LIMITATIONS: carrying, lifting, squatting, and locomotion level  PARTICIPATION LIMITATIONS: driving, community activity, and occupation  PERSONAL FACTORS: Fitness, Past/current experiences, Profession, Sex, Transportation, and 1-2 comorbidities: Left hip AVN and right oligodendroglioma  are also affecting patient's functional outcome.   REHAB POTENTIAL: Excellent  CLINICAL DECISION MAKING: Evolving/moderate complexity  EVALUATION COMPLEXITY: Moderate  PLAN:  PT FREQUENCY: 1x/week  PT DURATION: 8 weeks  PLANNED INTERVENTIONS: Therapeutic exercises, Therapeutic activity, Neuromuscular re-education, Balance training, Gait training, Patient/Family education, Self Care, Stair training, Vestibular  training, DME instructions, Manual therapy, and Re-evaluation  PLAN FOR NEXT SESSION: add to HEP for L NMR/strength, coordination tasks, L quad strengthening-LLE leg press, Y-balance, left single leg squat vs RDL, uneven surfaces, blaze pods-obstacle course, therastones, ladder climb? 4-way bear crawl on red mat vs mat table  Sadie Haber, PT, DPT 01/19/2023, 11:29 AM

## 2023-01-24 ENCOUNTER — Inpatient Hospital Stay: Payer: BC Managed Care – PPO

## 2023-01-24 ENCOUNTER — Inpatient Hospital Stay: Payer: BC Managed Care – PPO | Attending: Hematology and Oncology | Admitting: Hematology and Oncology

## 2023-01-24 ENCOUNTER — Encounter: Payer: Self-pay | Admitting: Hematology and Oncology

## 2023-01-24 ENCOUNTER — Other Ambulatory Visit (HOSPITAL_COMMUNITY): Payer: Self-pay

## 2023-01-24 VITALS — BP 124/83 | HR 81 | Temp 98.4°F | Resp 16 | Wt 185.6 lb

## 2023-01-24 DIAGNOSIS — R7983 Abnormal findings of blood amino-acid level: Secondary | ICD-10-CM | POA: Diagnosis not present

## 2023-01-24 DIAGNOSIS — Z7982 Long term (current) use of aspirin: Secondary | ICD-10-CM | POA: Insufficient documentation

## 2023-01-24 DIAGNOSIS — Z7902 Long term (current) use of antithrombotics/antiplatelets: Secondary | ICD-10-CM | POA: Diagnosis not present

## 2023-01-24 DIAGNOSIS — I639 Cerebral infarction, unspecified: Secondary | ICD-10-CM | POA: Insufficient documentation

## 2023-01-24 DIAGNOSIS — C719 Malignant neoplasm of brain, unspecified: Secondary | ICD-10-CM

## 2023-01-24 DIAGNOSIS — D6851 Activated protein C resistance: Secondary | ICD-10-CM | POA: Diagnosis not present

## 2023-01-24 DIAGNOSIS — Z79899 Other long term (current) drug therapy: Secondary | ICD-10-CM | POA: Insufficient documentation

## 2023-01-24 DIAGNOSIS — Z7901 Long term (current) use of anticoagulants: Secondary | ICD-10-CM | POA: Diagnosis not present

## 2023-01-24 MED ORDER — RIVAROXABAN 10 MG PO TABS
10.0000 mg | ORAL_TABLET | Freq: Every day | ORAL | 3 refills | Status: DC
  Filled 2023-01-24: qty 30, 30d supply, fill #0
  Filled 2023-02-18: qty 30, 30d supply, fill #1
  Filled 2023-03-23 (×2): qty 30, 30d supply, fill #2
  Filled 2023-04-19: qty 30, 30d supply, fill #3

## 2023-01-24 NOTE — Progress Notes (Signed)
[facility] CONSULT NOTE  Patient Care Team: Gweneth Dimitri, MD as PCP - General (Family Medicine) Artis Delay, MD as Consulting Physician (Hematology and Oncology) Van Clines, MD as Consulting Physician (Neurology)   ASSESSMENT & PLAN:  Acute CVA (cerebrovascular accident) [facility]) This is a very unfortunate case of a gentleman with history of oligodendroglioma, hyperhomocysteinemia, factor V Leiden heterozygous mutation and abnormal circulation  CT angiogram revealed narrowing of left ICA, right ACA, proximal basilar artery as well as small caliber of vertebral artery He has multiple risk of developing recurrent thrombosis/stroke In general, I would not expect patients with factor V Leiden heterozygous mutation to develop acute stroke at such a young age but given history of prior surgery for brain tumor, hyper homocystinemia and abnormal circulation, he has major risk factors for recurrent thrombosis Previously, he had significant nosebleed with dual antiplatelet agent of Plavix and aspirin  We discussed risk, benefits, side effects of adding low-dose Xarelto in addition to aspirin and he agreed to proceed The goal would be to stay on this dual antiplatelet agent and NOAC indefinitely We also discussed other risk factors for thrombosis including avoiding dehydration, hormonal supplement, prolonged immobility, and prophylactic anticoagulation therapy in the setting of surgery, etc. He does not need long-term follow-up with me but I will see him back if he has difficulties obtaining prescription refill for Xarelto or he needs surgical clearance/perioperative management I recommend CBC and creatinine monitoring with primary care doctor once a year to make sure that he does not develop GI bleed or renal failure  Oligodendroglioma (HCC) He will continue follow-up with neurologist  Homocysteinemia I recommend high-dose folic acid daily  Factor V Leiden mutation (HCC) Factor  V Leiden mutation by itself should not cause stroke but given multiple risk factors as above, he would be at high risk of recurrent stroke He will stay on low-dose Xarelto for secondary prevention We discussed implication of genetic testing on family members  All questions were answered. The patient knows to call the clinic with any problems, questions or concerns. The total time spent in the appointment was 60 minutes encounter with patients including review of chart and various tests results, discussions about plan of care and coordination of care plan  Artis Delay, MD 7/8/20242:37 PM  CHIEF COMPLAINTS/PURPOSE OF CONSULTATION:  Recent left sided weakness due to stroke, on background history of oligodendroglioma, hyperhomocysteinemia and factor V Leiden mutation  HISTORY OF PRESENTING ILLNESS:  Corey Chang 42 y.o. male is here because of recent diagnosis of stroke.  He is here accompanied by his aunt.  The patient was seen by myself back in 2015 due to history of brain tumor. He follows with neurologist for surveillance imaging as well as seizure management.  Prior to admission to the hospital, around end of April, he started to feel unwell.  He had episode of dehydration and weakness prior to admission.  On the day of admission, his hemoglobin was high but I suspect that could be due to dehydration. He was left with left-sided neurological deficit which is improving with physical therapy and rehab He had extensive blood work and imaging studies performed. After discharge from the hospital, he was placed on dual antiplatelet agent of which he developed significant nosebleed but that resolved after Plavix was discontinued He had remote history of smoking but quit in 2008.  No trauma or surgery leading to his diagnosis of stroke.  He had prior surgeries before and never had perioperative thromboembolic events.  He does not take testosterone replacement therapy. There is no family history of  blood clots or miscarriages. Imaging study review significant stenosis/narrowing of his blood vessels in his brain.  CT angiogram result from 11/23/2022 showed:  1. Moderate narrowing of the supraclinoid left ICA and moderate to severe focal narrowing of the A2 segment of the right ACA 2. Moderate focal narrowing of the proximal basilar artery. 3. No hemodynamically significant stenosis in the neck. Left vertebral artery is small in caliber and terminates as a PICA. -He does not have abnormal heart rhythm/atrial fibrillation or diagnosis of patent foramen ovale.  No other cardiovascular risk factors such as diabetes or hypertension He was also noted to have hyperhomocysteinemia and heterozygous for factor V Leiden mutation. He has 2 children aged 63 and 63.  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  He had external beam radiation in August 2001 with 54 Gy in 30 fractions on RTOG 9802 protocol.   He declined chemotherapy.    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[place], [place]    Employer: [employer]  Tobacco Use   Smoking status: Former    Packs/day: 0.50    Years: 15.00    Additional pack years: 0.00    Total pack years: 7.50    Types: Cigarettes    Quit date: 07/19/2006    Years since quitting: 16.5    Smokeless tobacco: Current    Types: Snuff   Tobacco comments:    Quit 2006  Dipping everyday  Vaping Use   Vaping Use: Never used  Substance and Sexual Activity   Alcohol use: Yes    Alcohol/week: 2.0 standard drinks of alcohol    Types: 2 Cans of beer per week    Comment: 2-3 beer daily   Drug use: No   Sexual activity: Yes    Birth control/protection: Pill  Other Topics Concern   Not on file  Social History Narrative   10/06/21   From: the area   Living: with wife, Aundra Millet (2008) and children   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[employer]   Three story   Family: 2 children - Mollie (2011) and Armed forces logistics/support/administrative officer (2014)      Enjoys: shoot - hunting and shooting      Exercise: trying to get kids to do soccer and play with children in sports   Diet: whatever he wants      Safety   Seat belts: Yes  and occasionally does not   Guns: Yes and secure   Safe in relationships: Yes       Social Determinants of Health   Financial Resource Strain: Low Risk  (12/07/2022)   Overall Financial Resource Strain (CARDIA)    Difficulty of Paying  Living Expenses: Not hard at all  Food Insecurity: No Food Insecurity (12/07/2022)   Hunger Vital Sign    Worried About Running Out of Food in the Last Year: Never true    Ran Out of Food in the Last Year: Never true  Transportation Needs: No Transportation Needs (12/07/2022)   PRAPARE - Administrator, Civil Service (Medical): No    Lack of Transportation (Non-Medical): No  Physical Activity: Sufficiently Active (12/07/2022)   Exercise Vital Sign    Days of Exercise per Week: 5 days    Minutes of Exercise per Session: 50 min  Stress: No Stress Concern Present (12/07/2022)   [employer] of Occupational Health - Occupational Stress Questionnaire    Feeling of Stress : Not at all  Social Connections: Socially Integrated (12/07/2022)   Social Connection and Isolation Panel [NHANES]    Frequency of Communication with Friends and Family: More than three times  a week    Frequency of Social Gatherings with Friends and Family: Once a week    Attends Religious Services: More than 4 times per year    Active Member of [employer] or Organizations: Yes    Attends Engineer, structural: More than 4 times per year    Marital Status: Married  Catering manager Violence: Not At Risk (11/23/2022)   Humiliation, Afraid, Rape, and Kick questionnaire    Fear of Current or Ex-Partner: No    Emotionally Abused: No    Physically Abused: No    Sexually Abused: No    FAMILY HISTORY: Family History  Problem Relation Age of Onset   Alzheimer's disease Mother    Diabetes Father    Heart disease Maternal Grandfather        MI   Parkinsonism Paternal Grandmother    Alzheimer's disease Paternal Grandmother    Depression Neg Hx    Drug abuse Neg Hx    Alcohol abuse Neg Hx    Cancer Neg Hx        no colon, prostate, breast, uterine,ovarian    ALLERGIES:  is allergic to carbatrol [carbamazepine] and sulfa antibiotics.  MEDICATIONS:  Current Outpatient Medications  Medication Sig Dispense Refill   folic acid (FOLVITE) 1 MG tablet Take 1 mg by mouth daily.     rivaroxaban (XARELTO) 10 MG TABS tablet Take 1 tablet (10 mg total) by mouth daily. 30 tablet 3   acetaminophen (TYLENOL) 325 MG tablet Take 1-2 tablets (325-650 mg total) by mouth every 4 (four) hours as needed for mild pain.     albuterol (VENTOLIN HFA) 108 (90 Base) MCG/ACT inhaler Inhale 2 puffs into the lungs every 6 (six) hours as needed for wheezing or shortness of breath. 8 g 2   aspirin EC 81 MG tablet Take 1 tablet (81 mg total) by mouth daily. Swallow whole. 240 tablet 0   atorvastatin (LIPITOR) 80 MG tablet TAKE 1 TABLET BY MOUTH EVERY DAY 90 tablet 0   lamoTRIgine 200 MG TB24 24 hour tablet Take 1 tablet (200 mg total) by mouth every evening. 30 tablet 0   levETIRAcetam (KEPPRA) 500 MG tablet Take 3 tablets (1,500 mg total) by mouth 2 (two) times daily. 540 tablet 3   loratadine (CLARITIN)  10 MG tablet Take 10 mg by mouth daily.     pantoprazole (PROTONIX) 40 MG tablet TAKE 1 TABLET BY MOUTH EVERY DAY 90 tablet 0   No current facility-administered medications for this visit.    REVIEW OF SYSTEMS:  Constitutional: Denies fevers, chills or abnormal night sweats Eyes: Denies blurriness of vision, double vision or watery eyes Ears, nose, mouth, throat, and face: Denies mucositis or sore throat Respiratory: Denies cough, dyspnea or wheezes Cardiovascular: Denies palpitation, chest discomfort or lower extremity swelling Gastrointestinal:  Denies nausea, heartburn or change in bowel habits Skin: Denies abnormal skin rashes Lymphatics: Denies new lymphadenopathy or easy bruising Behavioral/Psych: Mood is stable, no new changes  All other systems were reviewed with the patient and are negative.  PHYSICAL EXAMINATION: ECOG PERFORMANCE STATUS: 1 - Symptomatic but completely ambulatory  Vitals:   01/24/23 1305  BP: 124/83  Pulse: 81  Resp: 16  Temp: 98.4 F (36.9 C)  SpO2: 100%   Filed Weights   01/24/23 1305  Weight: 185 lb 9.6 oz (84.2 kg)    GENERAL:alert, no distress and comfortable NEURO: no focal motor/sensory deficits  LABORATORY DATA:  I have reviewed the data as listed Lab Results  Component Value Date   WBC 5.1 12/07/2022   HGB 16.5 12/07/2022   HCT 49.5 12/07/2022   MCV 90.5 12/07/2022   PLT 292.0 12/07/2022    RADIOGRAPHIC STUDIES: I have reviewed his CT imaging results I have personally reviewed the radiological images as listed and agreed with the findings in the report.

## 2023-01-24 NOTE — Assessment & Plan Note (Addendum)
This is a very unfortunate case of a gentleman with history of oligodendroglioma, hyperhomocysteinemia, factor V Leiden heterozygous mutation and abnormal circulation  CT angiogram revealed narrowing of left ICA, right ACA, proximal basilar artery as well as small caliber of vertebral artery He has multiple risk of developing recurrent thrombosis/stroke In general, I would not expect patients with factor V Leiden heterozygous mutation to develop acute stroke at such a young age but given history of prior surgery for brain tumor, hyper homocystinemia and abnormal circulation, he has major risk factors for recurrent thrombosis Previously, he had significant nosebleed with dual antiplatelet agent of Plavix and aspirin  We discussed risk, benefits, side effects of adding low-dose Xarelto in addition to aspirin and he agreed to proceed The goal would be to stay on this dual antiplatelet agent and NOAC indefinitely We also discussed other risk factors for thrombosis including avoiding dehydration, hormonal supplement, prolonged immobility, and prophylactic anticoagulation therapy in the setting of surgery, etc. He does not need long-term follow-up with me but I will see him back if he has difficulties obtaining prescription refill for Xarelto or he needs surgical clearance/perioperative management I recommend CBC and creatinine monitoring with primary care doctor once a year to make sure that he does not develop GI bleed or renal failure

## 2023-01-24 NOTE — Assessment & Plan Note (Signed)
I recommend high-dose folic acid daily

## 2023-01-24 NOTE — Assessment & Plan Note (Signed)
Factor V Leiden mutation by itself should not cause stroke but given multiple risk factors as above, he would be at high risk of recurrent stroke He will stay on low-dose Xarelto for secondary prevention We discussed implication of genetic testing on family members

## 2023-01-24 NOTE — Assessment & Plan Note (Signed)
He will continue follow-up with neurologist

## 2023-01-25 ENCOUNTER — Ambulatory Visit
Admission: EM | Admit: 2023-01-25 | Discharge: 2023-01-25 | Disposition: A | Discharge status: Home / Self Care | Payer: BC Managed Care – PPO | Attending: Family Medicine | Admitting: Family Medicine

## 2023-01-25 ENCOUNTER — Encounter: Payer: Self-pay | Admitting: Emergency Medicine

## 2023-01-25 ENCOUNTER — Other Ambulatory Visit: Payer: Self-pay

## 2023-01-25 DIAGNOSIS — S6992XA Unspecified injury of left wrist, hand and finger(s), initial encounter: Secondary | ICD-10-CM | POA: Diagnosis not present

## 2023-01-25 DIAGNOSIS — W540XXA Bitten by dog, initial encounter: Secondary | ICD-10-CM

## 2023-01-25 DIAGNOSIS — S60512A Abrasion of left hand, initial encounter: Secondary | ICD-10-CM | POA: Diagnosis not present

## 2023-01-25 DIAGNOSIS — Z23 Encounter for immunization: Secondary | ICD-10-CM

## 2023-01-25 MED ORDER — MUPIROCIN 2 % EX OINT: 1.0000 | TOPICAL_OINTMENT | Freq: Two times a day (BID) | CUTANEOUS | 0 refills | Status: DC

## 2023-01-25 MED ORDER — TETANUS-DIPHTH-ACELL PERTUSSIS 5-2.5-18.5 LF-MCG/0.5 IM SUSY
0.5000 mL | PREFILLED_SYRINGE | Freq: Once | INTRAMUSCULAR | Status: AC
  Administered 2023-01-25: 0.5 mL via INTRAMUSCULAR

## 2023-01-25 MED ORDER — AMOXICILLIN-POT CLAVULANATE 875-125 MG PO TABS: 1.0000 | ORAL_TABLET | Freq: Two times a day (BID) | ORAL | 0 refills | Status: DC

## 2023-01-25 NOTE — ED Notes (Signed)
Site cleaned with wound cleanser, bacitracin applied, nonadherent pad, secured with coban. Pt tolerated well.   Site management and infection prevention education reviewed. Pt verbalized understanding.

## 2023-01-25 NOTE — ED Triage Notes (Addendum)
Pt reports fell back and landed on dog on Sunday night and reports dog leaned back and bit left thumb/hand. Pt reports is personal dog and is up to date on rabies vaccines. Pt noted to have mild left hand redness and swelling.   Last tetanus 5-10 years ago.  Pt reports has been cleaning site with soap and water and states "looks better than it did."

## 2023-01-25 NOTE — ED Provider Notes (Signed)
[facility]    CSN: [id number] Arrival date & time: 01/25/23  1550      History   Chief Complaint Chief Complaint  Patient presents with   Hand Problem    HPI Corey Chang is a 42 y.o. male.   Patient presenting today with a dog bite to the left hand that occurred 2 days ago when he accidentally tripped and fell onto his sleeping pet dog.  The dog was startled and accidentally bit him.  He states the dog is up-to-date on vaccines including rabies.  He has been cleaning the area with soap and water.  Denies loss of range of motion, redness, thick drainage, fever, chills.  Unsure of his last tetanus shot.    Past Medical History:  Diagnosis Date   Allergic rhinitis 04/22/1997   Ankle fracture, right 07/2007   Brain cancer (HCC)    Cholelithiasis    Oligodendroglioma (HCC) 2001   Left temporal; bx in 12/1999.  He had external beam radiation in August 2001 with 54 Gy in 30 fractions on RTOG 9802 protocol.   He declined chemotherapy.    Seizure (HCC)    from history of oligodendroglioma    Patient Active Problem List   Diagnosis Date Noted   Factor V Leiden mutation (HCC) 01/24/2023   Homocysteinemia 01/24/2023   Acute stroke of medulla oblongata (HCC) 11/26/2022   Hyponatremia 11/24/2022   Acute CVA (cerebrovascular accident) (HCC) 11/23/2022   Polycythemia 11/23/2022   Elevated LFTs 04/12/2022   Mixed hyperlipidemia 04/12/2022   Acute cough 04/12/2022   Syncope 04/03/2022   Elevated troponin 04/03/2022   Avascular necrosis of bone of hip, left (HCC) 04/03/2022   Allergic rhinitis 04/03/2022   Eustachian tube dysfunction, left 10/06/2021   Bloody diarrhea 11/17/2017   History of oligodendroglioma of brain 03/22/2017   Status post radiation therapy 03/22/2017   GERD (gastroesophageal reflux disease) 11/11/2016   Complex partial seizure (HCC) 12/21/2012   Oligodendroglioma (HCC) 10/14/2011   Seasonal allergies 07/08/2008   SMOKELESS TOBACCO ABUSE  08/02/2007   Intracranial tumor (HCC) 11/17/1999   Seizure disorder (HCC) 01/16/1997    Past Surgical History:  Procedure Laterality Date   ANKLE SURGERY     right, x 3   brain biopsy  1997, 2001       Home Medications    Prior to Admission medications   Medication Sig Start Date End Date Taking? Authorizing Provider  amoxicillin-clavulanate (AUGMENTIN) 875-125 MG tablet Take 1 tablet by mouth every 12 (twelve) hours. 01/25/23  Yes Particia Nearing, PA-C  mupirocin ointment (BACTROBAN) 2 % Apply 1 Application topically 2 (two) times daily. 01/25/23  Yes Particia Nearing, PA-C  acetaminophen (TYLENOL) 325 MG tablet Take 1-2 tablets (325-650 mg total) by mouth every 4 (four) hours as needed for mild pain. 11/29/22   Love, Evlyn Kanner, PA-C  albuterol (VENTOLIN HFA) 108 (90 Base) MCG/ACT inhaler Inhale 2 puffs into the lungs every 6 (six) hours as needed for wheezing or shortness of breath. 04/12/22   Gweneth Dimitri, MD  aspirin EC 81 MG tablet Take 1 tablet (81 mg total) by mouth daily. Swallow whole. 12/02/22   Love, Evlyn Kanner, PA-C  atorvastatin (LIPITOR) 80 MG tablet TAKE 1 TABLET BY MOUTH EVERY DAY 01/05/23   Eden Emms, NP  folic acid (FOLVITE) 1 MG tablet Take 1 mg by mouth daily.    [provider]  lamoTRIgine 200 MG TB24 24 hour tablet Take 1 tablet (200 mg total)  by mouth every evening. 11/26/22   Alford Highland, MD  levETIRAcetam (KEPPRA) 500 MG tablet Take 3 tablets (1,500 mg total) by mouth 2 (two) times daily. 11/15/22   Van Clines, MD  loratadine (CLARITIN) 10 MG tablet Take 10 mg by mouth daily.    [provider]  pantoprazole (PROTONIX) 40 MG tablet TAKE 1 TABLET BY MOUTH EVERY DAY 01/05/23   Eden Emms, NP  rivaroxaban (XARELTO) 10 MG TABS tablet Take 1 tablet (10 mg total) by mouth daily. 01/24/23   Artis Delay, MD    Family History Family History  Problem Relation Age of Onset   Alzheimer's disease Mother    Diabetes Father    Heart  disease Maternal Grandfather        MI   Parkinsonism Paternal Grandmother    Alzheimer's disease Paternal Grandmother    Depression Neg Hx    Drug abuse Neg Hx    Alcohol abuse Neg Hx    Cancer Neg Hx        no colon, prostate, breast, uterine,ovarian    Social History Social History   Tobacco Use   Smoking status: Former    Packs/day: 0.50    Years: 15.00    Additional pack years: 0.00    Total pack years: 7.50    Types: Cigarettes    Quit date: 07/19/2006    Years since quitting: 16.5   Smokeless tobacco: Current    Types: Snuff   Tobacco comments:    Quit 2006  Dipping everyday  Vaping Use   Vaping Use: Never used  Substance Use Topics   Alcohol use: Yes    Alcohol/week: 2.0 standard drinks of alcohol    Types: 2 Cans of beer per week    Comment: 2-3 beer daily   Drug use: No     Allergies   Carbatrol [carbamazepine] and Sulfa antibiotics   Review of Systems Review of Systems Per HPI  Physical Exam Triage Vital Signs ED Triage Vitals  Enc Vitals Group     BP 01/25/23 1613 116/82     Pulse Rate 01/25/23 1613 74     Resp 01/25/23 1613 20     Temp 01/25/23 1613 98.4 F (36.9 C)     Temp Source 01/25/23 1613 Oral     SpO2 01/25/23 1613 98 %     Weight --      Height --      Head Circumference --      Peak Flow --      Pain Score 01/25/23 1611 2     Pain Loc --      Pain Edu? --      Excl. in GC? --    No data found.  Updated Vital Signs BP 116/82 (BP Location: Right Arm)   Pulse 74   Temp 98.4 F (36.9 C) (Oral)   Resp 20   SpO2 98%   Visual Acuity Right Eye Distance:   Left Eye Distance:   Bilateral Distance:    Right Eye Near:   Left Eye Near:    Bilateral Near:     Physical Exam Vitals and nursing note reviewed.  Constitutional:      Appearance: Normal appearance.  HENT:     Head: Atraumatic.  Eyes:     Extraocular Movements: Extraocular movements intact.     Conjunctiva/sclera: Conjunctivae normal.  Cardiovascular:      Rate and Rhythm: Normal rate and regular rhythm.     Pulses:  Normal pulses.  Pulmonary:     Effort: Pulmonary effort is normal.     Breath sounds: Normal breath sounds.  Musculoskeletal:        General: Swelling, tenderness and signs of injury present. Normal range of motion.     Cervical back: Normal range of motion and neck supple.     Comments: Fairly diffuse edema to the left dorsal hand surrounding areas of abrasions from dog bite.  No erythema, fluctuance, drainage and bleeding well-controlled.  Areas of abrasions are tender to palpation  Skin:    General: Skin is warm.  Neurological:     General: No focal deficit present.     Mental Status: He is oriented to person, place, and time.  Psychiatric:        Mood and Affect: Mood normal.        Thought Content: Thought content normal.        Judgment: Judgment normal.      UC Treatments / Results  Labs (all labs ordered are listed, but only abnormal results are displayed) Labs Reviewed - No data to display  EKG   Radiology No results found.  Procedures Procedures (including critical care time)  Medications Ordered in UC Medications  Tdap (BOOSTRIX) injection 0.5 mL (0.5 mLs Intramuscular Given 01/25/23 1711)    Initial Impression / Assessment and Plan / UC Course  I have reviewed the triage vital signs and the nursing notes.  Pertinent labs & imaging results that were available during my care of the patient were reviewed by me and considered in my medical decision making (see chart for details).     Tdap updated, wound cleaned and dressed today and will treat with Augmentin, mupirocin ointment and good home wound care.  Follow-up for worsening symptoms.  Dog up-to-date on rabies  Final Clinical Impressions(s) / UC Diagnoses   Final diagnoses:  Abrasion of left hand, initial encounter  Dog bite, initial encounter  Injury of left hand, initial encounter     Discharge Instructions      Clean the area with  Hibiclens twice daily and apply mupirocin ointment and a nonstick dressing.  Keep it covered until it is fully healed up.  Elevate at rest, ice off-and-on.  Take the full course of antibiotics and follow-up if worsening    ED Prescriptions     Medication Sig Dispense Auth. Provider   amoxicillin-clavulanate (AUGMENTIN) 875-125 MG tablet Take 1 tablet by mouth every 12 (twelve) hours. 14 tablet Particia Nearing, [place]   mupirocin ointment (BACTROBAN) 2 % Apply 1 Application topically 2 (two) times daily. 22 g Particia Nearing, [place]      PDMP not reviewed this encounter.   Particia Nearing, [place] 01/25/23 [id number]

## 2023-01-25 NOTE — Discharge Instructions (Signed)
Clean the area with Hibiclens twice daily and apply mupirocin ointment and a nonstick dressing.  Keep it covered until it is fully healed up.  Elevate at rest, ice off-and-on.  Take the full course of antibiotics and follow-up if worsening

## 2023-01-26 ENCOUNTER — Ambulatory Visit: Payer: BC Managed Care – PPO | Admitting: Physical Therapy

## 2023-01-26 ENCOUNTER — Encounter: Payer: Self-pay | Admitting: Physical Therapy

## 2023-01-26 ENCOUNTER — Ambulatory Visit: Payer: BC Managed Care – PPO | Admitting: Occupational Therapy

## 2023-01-26 DIAGNOSIS — R278 Other lack of coordination: Secondary | ICD-10-CM

## 2023-01-26 DIAGNOSIS — R29898 Other symptoms and signs involving the musculoskeletal system: Secondary | ICD-10-CM

## 2023-01-26 DIAGNOSIS — R2689 Other abnormalities of gait and mobility: Secondary | ICD-10-CM

## 2023-01-26 DIAGNOSIS — M6281 Muscle weakness (generalized): Secondary | ICD-10-CM

## 2023-01-26 DIAGNOSIS — R208 Other disturbances of skin sensation: Secondary | ICD-10-CM

## 2023-01-26 DIAGNOSIS — I69354 Hemiplegia and hemiparesis following cerebral infarction affecting left non-dominant side: Secondary | ICD-10-CM

## 2023-01-26 NOTE — Therapy (Signed)
OUTPATIENT OCCUPATIONAL THERAPY NEURO TREATMENT  Patient Name: Corey Chang MRN: [id number] DOB:06/02/1981, 42 y.o., male Today's Date: 01/26/2023  PCP: Gweneth Dimitri, MD REFERRING PROVIDER: Charlton Amor, PA-C  END OF SESSION:  OT End of Session - 01/26/23 1709     Visit Number 8    Number of Visits 17    Date for OT Re-Evaluation 02/04/23    Authorization Type [employer] - Auth not Reqd    OT Start Time 1021    OT Stop Time 1100    OT Time Calculation (min) 39 min    Equipment Utilized During Treatment FM items    Activity Tolerance Patient tolerated treatment well    Behavior During Therapy [facility] for tasks assessed/performed             Past Medical History:  Diagnosis Date   Allergic rhinitis 04/22/1997   Ankle fracture, right 07/2007   Brain cancer (HCC)    Cholelithiasis    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  He had external beam radiation in August 2001 with 54 Gy in 30 fractions on RTOG 9802 protocol.   He declined chemotherapy.    Seizure (HCC)    from history of oligodendroglioma   Past Surgical History:  Procedure Laterality Date   ANKLE SURGERY     right, x 3   brain biopsy  1997, 2001   Patient Active Problem List   Diagnosis Date Noted   Factor V Leiden mutation (HCC) 01/24/2023   Homocysteinemia 01/24/2023   Acute stroke of medulla oblongata (HCC) 11/26/2022   Hyponatremia 11/24/2022   Acute CVA (cerebrovascular accident) (HCC) 11/23/2022   Polycythemia 11/23/2022   Elevated LFTs 04/12/2022   Mixed hyperlipidemia 04/12/2022   Acute cough 04/12/2022   Syncope 04/03/2022   Elevated troponin 04/03/2022   Avascular necrosis of bone of hip, left (HCC) 04/03/2022   Allergic rhinitis 04/03/2022   Eustachian tube dysfunction, left 10/06/2021   Bloody diarrhea 11/17/2017   History of oligodendroglioma of brain 03/22/2017   Status post radiation therapy 03/22/2017   GERD (gastroesophageal reflux disease) 11/11/2016    Complex partial seizure (HCC) 12/21/2012   Oligodendroglioma (HCC) 10/14/2011   Seasonal allergies 07/08/2008   SMOKELESS TOBACCO ABUSE 08/02/2007   Intracranial tumor (HCC) 11/17/1999   Seizure disorder (HCC) 01/16/1997    ONSET DATE: Referral date: 12/06/2022   ONSET: 11/23/22  REFERRING DIAG: I63.9 (ICD-10-CM) - CVA (cerebral vascular accident)  THERAPY DIAG:  Other lack of coordination  Other disturbances of skin sensation  Hemiplegia and hemiparesis following cerebral infarction affecting left non-dominant side (HCC)  Other symptoms and signs involving the musculoskeletal system  Muscle weakness (generalized)  Rationale for Evaluation and Treatment: Rehabilitation OT Discharge from Hospital states: "Patient will benefit from ongoing skilled OT services in outpatient setting to continue to advance functional skills in the area of BADL, iADL, Vocation, and Reduce care partner burden."  SUBJECTIVE STATEMENT:  Pt states he was bitten in the left hand by his wife's dog after he accidentally tripped over him. He has had difficulty keeping it covered and from bleeding.    Pt accompanied by: significant other - Self   PERTINENT HISTORY:   42 yo male with history of oligodendroglioma s/p Postoperative radiotherapy (XRT), complex partial seizures, AVN left hip who was admitted to [facility] on 11/23/2022 with 1 day history of left-sided numbness with left lower extremity weakness followed by sudden onset of dizziness and diarrhea.  He was found to have acute  infarct in medulla as well as unchanged nonenhancing right temporal lobe mass.    Principal Problem:   Acute stroke of medulla oblongata (HCC) Active Problems:   Seizure disorder from history of oligodendroglioma   GERD (gastroesophageal reflux disease)   Allergic rhinitis Hyponatremia Polycythemia   Oligodendroglioma Left temporal; bx in 12/1999.  He had external beam radiation in August 2001 with 54 Gy in 30 fractions on RTOG  9802 protocol.   He declined chemotherapy.   Ankle surgery x 3   PRECAUTIONS: Fall and Other: currently not driving, h/o seizures (managed by medication)  WEIGHT BEARING RESTRICTIONS: No  PAIN:  Are you having pain? 1/10 R foot; L hand  FALLS: Has patient fallen in last 6 months? YES  At evaluation - patient reports stumbling 1x by getting his foot hooked up in some laundry in the floor and stumbling against the wall.  Patient fell morning of 01/05/23 prior to therapy appointment.  He scraped his L arm on an open drawer due to leftover luggage on the floor and his dog getting in the way.   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  Currently off work x 6 months, and Leisure: hunting, fishing, watching kids sports   PATIENT GOALS: To be able to do more FM stuff with his left hand, for things to be easier   OBJECTIVE:   HAND DOMINANCE: Right  ADLs:  "At inpatient rehab admission, patient required min to mod assist with basic ADL tasks and min assist with mobility. He exhibited mild dysarthria and mild deficits in higher level tasks. He has had improvement in activity tolerance, balance, postural control as well as ability to compensate for deficits. He is able to complete ADL tasks at modified independent level. He was independent for transfers and is able to ambulate 300' with use of LLE Swedish Knee cage independently in supervised setting and with supervision in community setting. His speech intelligibility has improved and he is able to utilize strategies  for clarity as well as swallowing at modified independent level.  He is tolerating regular diet without any signs of aspiration."  Overall ADLs: Patient reports MI with ADLs although buttons do get frustrating. Transfers/ambulation related to ADLs: MI without knee brace at home Eating: R handed so able to hold fork in L hand to cut food with R hand Grooming: electric razor/trimmer UB Dressing: MI with some assistance for buttons LB Dressing: MI - managed jeans button/zipper and belt today Toileting: MI Bathing: MI - showers in standing generally, he did sit on shower seat today when he got a nose bleed Tub Shower transfers: walk in shower Equipment: Shower seat with back  IADLs:            Shopping: currently relying on spouse Light housekeeping: currently relying on spouse due to standing for long periods of time being difficult due to L hip discomfort Meal Prep: currently relying on spouse - did not have activity tolerance this morning to try and cook eggs Community mobility: depends on spouse - RE: Driving - MD told him to wait 2 weeks and  then [place] before resuming driving Medication management: wife puts them in a medicine box due to coordination difficulties Financial management: shares with spouse - mostly via phone Handwriting: 100% legible - R handed  MOBILITY STATUS: Independent and wears knee brace for long distance but not in the house  POSTURE COMMENTS:  No Significant postural limitations Sitting balance:  WFL  ACTIVITY TOLERANCE: Activity tolerance: LIMITED -- Had cleaned off the stove and washed something to make eggs this morning but needed to sit and rest.  FUNCTIONAL OUTCOME MEASURES (Eval): Upper Extremity Functional Scale (UEFS): 68 as rated by patient and 48 by spouse  0 to 80 score range of the UEFS -- Lower scores indicate that the subject is reporting increased difficulty with the activities as a result of their upper limb  condition. While higher scores indicate less severity.   FOTO Severity: Slight (Intake FS: 60)    UPPER EXTREMITY ROM:    A/ROM - WNL L UE moves slower than R UE but achieves same ROM  UPPER EXTREMITY MMT:     R UE - 5/5 L UE - 4 to 4+/5  HAND FUNCTION: Grip strength: Right: 166.8 lbs; Left: 101.1  lbs, Lateral pinch: Right: 30 lbs, Left: 24 lbs, and 3 point pinch: Right: 30 lbs, Left: 20 lbs Hospital DC last week:  R Dominant Hand:  Average: 169.33 lbs L Nondominant (hemiparetic) Hand: Average: 91.67 lbs  COORDINATION: 9 Hole Peg test: Right: 33.94  sec; Left: 102.15 sec Box and Blocks:  Right 46 blocks, Left 27 blocks Used R hand to help L hand on occasion See goals for update from 01/05/23  SENSATION: Light touch: Impaired  Hot/Cold: Impaired   Patient was able to feel light touch on all fingers at least 1/3 times but had difficulty with median nerve distribution with lightest touch and would benefit from more formal testing.  He self reports hot and cold not being as distinct on his L side but noting improvements everyday.   EDEMA: No swelling  MUSCLE TONE: WFL  COGNITION: Overall cognitive status: Within functional limits for tasks assessed  VISION: Subjective report: No issues Baseline vision: No visual deficits Visual history:  N/A  VISION ASSESSMENT: WFL  PERCEPTION: WFL  PRAXIS: WFL  Evaluation OBSERVATIONS: Patient arrives today with his wife with L knee brace in place and ambulates without AE from lobby to therapy gym.   No loss of balance noted during mobility tasks during short observations of mobility in clinic. He demonstrates full ROM of BUE although L UE moves at a slower rate than R UE and has obvious coordination deficits as noted by time to compete 9 hole peg test at 3x the time for R UE.    TODAY'S TREATMENT:  Reviewed scapular HEP with modifications given L hand injury. Pt requiring min cueing for proper completion. Pt completing LUE overhead lean to right side while sitting edge of mat table.   OT had pt remove soiled bandaid from L hand before cleansing with soap and water. Pt then opened bandage supplies with LUE for fine motor control and apply dressings as needed to prevent complications and promote healing.    PATIENT EDUCATION: Education details: Publishing rights manager, coordination, wound dressing  Person educated: Patient Education method: Explanation, demonstration, Verbal cues Education comprehension: verbalized understanding, return demonstration and needs further education  HOME EXERCISE PROGRAM: 12/15/22 -- Coordination handouts with images Access Code: [email or web address] URL: [email or web address] Date: 12/15/2022 * Finger Pinch and Pull with Putty  - 1 x daily - 3 sets - 10 reps  01/12/23: scapula stabilization/strengthening HEP   GOALS: Goals reviewed with patient? Yes   SHORT TERM GOALS: Target date: 01/05/23  Will improve buttoning 4 buttons in less than 1 minute  and improve score on UEFI from little difficulty to no difficulty.  Baseline: 1:28  button/unbutton (1:20 to fasten & < 10 seconds to unfasten) Goal status: MET 01/05/23 fasten 42 secs - 56 seconds total time    2.  Patient will complete nine-hole peg with use of L UE in less than 1 minute.  Baseline: 102 seconds Goal status: IN PROGRESS 01/04/23: 85 seconds  3.  Pt will be able to place at least 32 blocks using left hand with completion of Box and Blocks test.  Baseline: 27 blocks Goal status: MET 01/04/23: 32 blocks   4.  Following sensory testing (2 pt discrimination and stereognosis 12/15/22) Patient will identify 5/5 items with vision occluded in L hand. Baseline: 0/3 items, Light touch 6-8 mm fingertips and self reported decreased awareness of hot/cold. Goal status: REVISED 01/05/23 - 3/6  items   5.  Patient will report no difficulty with tasks from UEFI test including lifting grocery bags, preparing food, and helping with IADLS in standing for improved UEFI score x 5 points. Baseline: Patient 68/80 & Spouse 48/80 Goal status: IN PROGRESS  6.  Patient will have increased activity tolerance for standing 15 minutes while moving heavy objects (15+ pounds) to simulate work tasks. Baseline: Patient had to sit after getting dishes ready to make eggs this morning Goal status: MET 01/05/23 - Reports carrying 2 - 50 lb salt bags, one in each hand with plastic handle from house to the pool - down a driveway 30-50 yards without rest break.   LONG TERM GOALS: Target date: 02/04/23  Patient will complete nine-hole peg with use of L in 45 seconds or less.  Baseline: 102 seconds Goal status: IN PROGRESS 01/05/23 - 85 seconds  2.  Pt will be able to place at least >35 blocks using left hand with completion of Box and Blocks test.  Baseline: 27 blocks Goal status: IN PROGRESS 01/05/23 - 32 blocks  3.  Patient will report no difficulty with tasks from UEFI test for score of > 75/80 for minimal residual deficits/difficulties. Baseline: Patient 68/80 & Spouse 48/80 Goal status: IN PROGRESS  4.  Patient will identify 3 compensatory techniques to be MI with any sensory compensatory techniques for any residual deficits to minimize risk of self-injury. Baseline: Minor light touch deficits and temperature deficits Goal status: IN PROGRESS  5.  Patient will be MI with all HEPs for high level coordination and sustained activity tolerance. Baseline: Aware of some exercises from hospital  Goal status: IN  PROGRESS   6.  Patient will have increased activity tolerance for standing 30 minutes while moving heavy objects (30+ pounds) to simulate work tasks and/or complete chores at home as appropriate (laundry/meal prep etc) Baseline:  Goal status: MET Carried 2 - 50 lb salt bags, one in each hand  with plastic handle from house to the pool - down a driveway 30-50 yards without rest break.   ASSESSMENT:  CLINICAL IMPRESSION: Pt demonstrates good understanding of HEP and modifications as needed given recent hand injury as needed to progress towards goals. Additional bandaging provided to allow for better healing and to prevent complications.   PERFORMANCE DEFICITS: in functional skills including ADLs, IADLs, coordination, dexterity, proprioception, sensation, Fine motor control, endurance, and UE functional use,   IMPAIRMENTS: are limiting patient from ADLs, IADLs, work, leisure, and social participation.   CO-MORBIDITIES: has co-morbidities such as h.o seizures and  unchanged nonenhancing right temporal lobe mass.that affects occupational performance. Patient will benefit from skilled OT to address above impairments and improve overall function.  REHAB POTENTIAL: Excellent  PLAN:  OT FREQUENCY: 1-2x/week  OT DURATION: 8 weeks  PLANNED INTERVENTIONS: self care/ADL training, therapeutic exercise, therapeutic activity, neuromuscular re-education, functional mobility training, patient/family education, energy conservation, coping strategies training, and DME and/or AE instructions  RECOMMENDED OTHER SERVICES: Patient scheduled for PT/ST evaluations on Friday  CONSULTED AND AGREED WITH PLAN OF CARE: Patient and family member/caregiver  PLAN FOR NEXT SESSION: Check L hand wound/edema   Begin progression of FM/UE tasks for small motor coordination as noted by > 1 minutes still with 9 hole peg test ie) in hand manipulation (try wooden vs plastic pegs etc).   Continue with stereognosis activities ie) hidden items in water, rice, beans or simply a bag of items and ask him to find a specific items.   Delana Meyer, OT 01/26/2023, 5:11 PM

## 2023-01-26 NOTE — Patient Instructions (Signed)
"  Pen Tricks" Bigger object = easier  Rotation- Twirl pen like a baton 10-15 x one direction, then switch directions   Shift- Hold pen like a dart at the tip and shift the pen ("inch worm") until holding at the base of the pen. Shift back to the tip of the pen and repeat 10-15x   Flip- With hand down on the table, hold pen in writing position, then flip it to an erase position. Flip it back to writing position and repeat 10-15x   Translation- With your palm up to the sky, put an object in your palm. Without tipping your hand, use your fingers and thumb to move the object to the tips of each of your fingers, one-at-a-time.  Repeat 5-10x each finger. 

## 2023-01-26 NOTE — Therapy (Signed)
OUTPATIENT PHYSICAL THERAPY NEURO TREATMENT   Patient Name: Ashtin Rosner MRN: [id number] DOB:1981/01/17, 42 y.o., male Today's Date: 01/26/2023   PCP: Eden Emms, MD REFERRING PROVIDER: Charlton Amor, PA-C  END OF SESSION:  PT End of Session - 01/26/23 1114     Visit Number 7    Number of Visits 9   8 + eval   Date for PT Re-Evaluation 02/11/23   pushed out to accomodate scheduling w/ OT   Authorization Type BLUE CROSS BLUE [employer]IELD    PT Start Time 1110    PT Stop Time 1154    PT Time Calculation (min) 44 min    Equipment Utilized During Treatment Gait belt   Left knee cage   Activity Tolerance Patient tolerated treatment well    Behavior During Therapy WFL for tasks assessed/performed;Impulsive              Past Medical History:  Diagnosis Date   Allergic rhinitis 04/22/1997   Ankle fracture, right 07/2007   Brain cancer (HCC)    Cholelithiasis    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  He had external beam radiation in August 2001 with 54 Gy in 30 fractions on RTOG 9802 protocol.   He declined chemotherapy.    Seizure (HCC)    from history of oligodendroglioma   Past Surgical History:  Procedure Laterality Date   ANKLE SURGERY     right, x 3   brain biopsy  1997, 2001   Patient Active Problem List   Diagnosis Date Noted   Factor V Leiden mutation (HCC) 01/24/2023   Homocysteinemia 01/24/2023   Acute stroke of medulla oblongata (HCC) 11/26/2022   Hyponatremia 11/24/2022   Acute CVA (cerebrovascular accident) (HCC) 11/23/2022   Polycythemia 11/23/2022   Elevated LFTs 04/12/2022   Mixed hyperlipidemia 04/12/2022   Acute cough 04/12/2022   Syncope 04/03/2022   Elevated troponin 04/03/2022   Avascular necrosis of bone of hip, left (HCC) 04/03/2022   Allergic rhinitis 04/03/2022   Eustachian tube dysfunction, left 10/06/2021   Bloody diarrhea 11/17/2017   History of oligodendroglioma of brain 03/22/2017   Status post  radiation therapy 03/22/2017   GERD (gastroesophageal reflux disease) 11/11/2016   Complex partial seizure (HCC) 12/21/2012   Oligodendroglioma (HCC) 10/14/2011   Seasonal allergies 07/08/2008   SMOKELESS TOBACCO ABUSE 08/02/2007   Intracranial tumor (HCC) 11/17/1999   Seizure disorder (HCC) 01/16/1997    ONSET DATE: 11/26/2022 (CVA)  REFERRING DIAG: I63.9 (ICD-10-CM) - Cerebral infarction, unspecified  THERAPY DIAG:  Other lack of coordination  Hemiplegia and hemiparesis following cerebral infarction affecting left non-dominant side (HCC)  Other abnormalities of gait and mobility  Rationale for Evaluation and Treatment: Rehabilitation  SUBJECTIVE:  SUBJECTIVE STATEMENT: Patient was recently seen in ED for dog bite to left hand and is on antibiotics.  His AC is not working at his home and he went to the basement to work on it and came back up to [employer] and he tripped over a laptop cord that catches him when he steps back and lands on his sleeping dog.  He denies a fall or other near falls.  His left hand hurts a little, but nothing else is bothering him.    Pt accompanied by: significant other-wife, [name]; pt states he should be cleared to return to driving in the next week  PERTINENT HISTORY: L hip AVN (ortho not planning to address unless significant limitations in function-followed by [facility]), ankle ORIF x3, oligodendroglioma s/p Postoperative radiotherapy (XRT), complex partial seizures  Right hand dominant male who was admitted to [facility] on 11/23/2022 with 1 day history of left-sided numbness with left lower extremity weakness followed by sudden onset of dizziness and diarrhea.  He was found to have acute infarct in medulla.  PAIN:  Are you having pain? 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 2. Unchanged nonenhancing right temporal lobe mass, previously reported to represent an oligodendroglioma.  COGNITION: Overall cognitive status: Within functional limits for tasks assessed   SENSATION: Light touch: Impaired ; left side feels more dull compared to right  COORDINATION: LE RAMS:  dysmetric Heel-to-shin:  limited by L Hip AVN  EDEMA:  None noted in BLE  MUSCLE TONE: None noted during functional assessment  POSTURE: No Significant postural limitations  LOWER EXTREMITY ROM:     Active  Right Eval Left Eval  Hip flexion Grossly WFL  Hip extension   Hip abduction   Hip adduction   Hip internal rotation   Hip external rotation   Knee flexion   Knee extension   Ankle dorsiflexion   Ankle plantarflexion    Ankle inversion    Ankle eversion     (Blank rows = not tested)  LOWER EXTREMITY MMT:    MMT Right Eval Left Eval  Hip flexion 5/5 4+/5  Hip extension    Hip abduction " "  Hip adduction " "  Hip internal rotation     Hip external rotation    Knee flexion " "  Knee extension " "  Ankle dorsiflexion " "  Ankle plantarflexion    Ankle inversion    Ankle eversion    (Blank rows = not tested)  BED MOBILITY:  Sit to supine Complete Independence Supine to sit Complete  Independence Rolling to Right Complete Independence Rolling to Left Complete Independence  TRANSFERS: Assistive device utilized: None  Sit to stand: Complete Independence Stand to sit: Complete Independence Chair to chair: Complete Independence  GAIT: Gait pattern: decreased hip/knee flexion- Left and genu recurvatum- Left Distance walked: various clinic distances Assistive device utilized: None Level of assistance: SBA Comments: Pt has one instance of left LE buckling stating his hip was catching and hurting.  FUNCTIONAL TESTS:  5 times sit to stand: 6.72 seconds no UE support, intermittent touch of back of legs to chair, no LOB 6 minute walk test: To be assessed. Functional gait assessment: To be assessed.  PATIENT SURVEYS:  FOTO not captured at intake.  TODAY'S TREATMENT:                                                                                                                              -Treadmill training x8.5 minutes up to 6% incline w/ BUE progressed to no UE support at 1.6 mph w/ good reciprocal mobility.  Used knee cage to prevent poor quality mechanics, but improved control of knee noted. -Pallof hold w/ 5.5lb ball bimanually in long lever in sitting performing LE lift across kettlebell hurdle x15 to each side; cued for core engagement -LLE leg press 2x10 at 45 lbs w/ focus on eccentric control and prevention of knee hyperextension (only single instance w/ immediate correction by patient) -Y balance w/ BUE support (attempted unsupported w/ pt having instability) x15 using visual targets for progressed ROM -Bear crawls in counterclockwise x5 > clockwise x5 on mat table, cued for quick pace using timed component,  harder performing clockwise likely due to initiating and pushing w/ left hemibody  PATIENT EDUCATION: Education details: Discussed gradual return to gym w/ use of body weight, resistance bands, and bike or treadmill for aerobic progression and power production.  Continue using knee cage for pain and excessive hyperextension prevention-discussed that he may always have a little knee hyperextension especially if he had this prior to CVA.  Continue HEP and walking regularly. Person educated: Patient Education method: Chief Technology Officer Education comprehension: verbalized understanding  HOME EXERCISE PROGRAM: Access Code: [email or web address] URL: [email or web address] Date: 12/15/2022 Prepared by: Peter Congo  Exercises - Staggered Sit-to-Stand  - 1 x daily - 7 x weekly - 3 sets - 10 reps - Standing Terminal Knee Extension with Resistance  - 1 x daily - 7 x weekly - 3 sets - 10 reps - Staggered Stance Squat  - 1 x daily - 7 x weekly - 3 sets - 10 reps - Forward Step Down with Heel Tap and Counter Support  - 1 x daily - 7 x weekly - 3 sets - 10 reps  GOALS: Goals reviewed with patient? Yes  SHORT TERM GOALS: Target date: 01/07/2023  Pt will ambulate greater than or equal to 1300 feet on with LRAD and mod I for improved cardiovascular endurance and BLE strength.  Baseline:  1114 ft (5/29); 1066 ft (6/26) Goal status: NOT MET  2.  Pt will improve FGA to 23/30 for decreased fall risk  Baseline: 20/30 (5/29); 20/30 (6/26) Goal status: NOT MET  LONG TERM GOALS: Target date: 02/04/2023  Pt will be independent with strength and balance HEP to improve mobility and functional independence. Baseline: To be established. Goal status: INITIAL  2.  Pt will ambulate greater than or equal to 1300 feet on with LRAD and mod I for improved cardiovascular endurance and BLE strength.  Baseline: 1114 ft (5/29); 1066 ft (6/26) Goal status: REVISED-6/26  3.  Pt will improve FGA to  24/30 for decreased fall risk  Baseline: 20/30 (5/29); 20/30 (6/26) Goal status: REVISED-6/26  4.  Pt will ambulate >/=500 feet with LRAD over unlevel indoor and outdoor surfaces demonstrating improved left knee control at no more than modI level of assist to promote household and community access. Baseline: various level clinic distances w/ left genu recurvatum wearing left knee cage Goal status: INITIAL  ASSESSMENT:  CLINICAL IMPRESSION: Patient demonstrates excellent progress of treadmill training and tolerance this visit with increase to 6% incline and increased speed to 1.49mph.  Continued to address knee control in stance with patient struggling with progression away from hand support with Y balance task.  He would benefit from re-visiting this task in the future for continued challenge.  PT to continue POC.  OBJECTIVE IMPAIRMENTS: Abnormal gait, decreased activity tolerance, decreased balance, decreased coordination, decreased endurance, decreased knowledge of use of DME, difficulty walking, decreased strength, and impaired sensation.   ACTIVITY LIMITATIONS: carrying, lifting, squatting, and locomotion level  PARTICIPATION LIMITATIONS: driving, community activity, and occupation  PERSONAL FACTORS: Fitness, Past/current experiences, Profession, Sex, Transportation, and 1-2 comorbidities: Left hip AVN and right oligodendroglioma  are also affecting patient's functional outcome.   REHAB POTENTIAL: Excellent  CLINICAL DECISION MAKING: Evolving/moderate complexity  EVALUATION COMPLEXITY: Moderate  PLAN:  PT FREQUENCY: 1x/week  PT DURATION: 8 weeks  PLANNED INTERVENTIONS: Therapeutic exercises, Therapeutic activity, Neuromuscular re-education, Balance training, Gait training, Patient/Family education, Self Care, Stair training, Vestibular training, DME instructions, Manual therapy, and Re-evaluation  PLAN FOR NEXT SESSION: add to HEP for L NMR/strength, coordination tasks, L quad  strengthening-LLE leg press, Y-balance-decreased hand support on varied surface type, left single leg squat vs RDL, uneven surfaces, blaze pods-obstacle course, therastones, ladder climb?, continue treadmill training  Sadie Haber, PT, DPT 01/26/2023, 11:56 AM

## 2023-02-02 ENCOUNTER — Ambulatory Visit: Payer: BC Managed Care – PPO | Admitting: Occupational Therapy

## 2023-02-02 ENCOUNTER — Ambulatory Visit: Payer: BC Managed Care – PPO | Admitting: Physical Therapy

## 2023-02-02 DIAGNOSIS — R2689 Other abnormalities of gait and mobility: Secondary | ICD-10-CM

## 2023-02-02 DIAGNOSIS — M6281 Muscle weakness (generalized): Secondary | ICD-10-CM

## 2023-02-02 DIAGNOSIS — I69354 Hemiplegia and hemiparesis following cerebral infarction affecting left non-dominant side: Secondary | ICD-10-CM

## 2023-02-02 DIAGNOSIS — R278 Other lack of coordination: Secondary | ICD-10-CM | POA: Diagnosis not present

## 2023-02-02 DIAGNOSIS — R29898 Other symptoms and signs involving the musculoskeletal system: Secondary | ICD-10-CM

## 2023-02-02 DIAGNOSIS — R208 Other disturbances of skin sensation: Secondary | ICD-10-CM

## 2023-02-02 NOTE — Therapy (Signed)
OUTPATIENT PHYSICAL THERAPY NEURO TREATMENT   Patient Name: Corey Chang MRN: [id number] DOB:[birth year], 42 y.o., male Today's Date: 02/02/2023   PCP: Eden Emms, MD REFERRING PROVIDER: Charlton Amor, PA-C  END OF SESSION:  PT End of Session - 02/02/23 1103     Visit Number 8    Number of Visits 9   8 + eval   Date for PT Re-Evaluation 02/11/23   pushed out to accomodate scheduling w/ OT   Authorization Type BLUE CROSS BLUE SHIELD    PT Start Time 1102    PT Stop Time 1144    PT Time Calculation (min) 42 min    Equipment Utilized During Treatment Gait belt   Left knee cage   Activity Tolerance Patient tolerated treatment well    Behavior During Therapy WFL for tasks assessed/performed;Impulsive               Past Medical History:  Diagnosis Date   Allergic rhinitis 04/22/1997   Ankle fracture, right 07/2007   Brain cancer (HCC)    Cholelithiasis    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  He had external beam radiation in August 2001 with 54 Gy in 30 fractions on RTOG 9802 protocol.   He declined chemotherapy.    Seizure (HCC)    from history of oligodendroglioma   Past Surgical History:  Procedure Laterality Date   ANKLE SURGERY     right, x 3   brain biopsy  1997, 2001   Patient Active Problem List   Diagnosis Date Noted   Factor V Leiden mutation (HCC) 01/24/2023   Homocysteinemia 01/24/2023   Acute stroke of medulla oblongata (HCC) 11/26/2022   Hyponatremia 11/24/2022   Acute CVA (cerebrovascular accident) (HCC) 11/23/2022   Polycythemia 11/23/2022   Elevated LFTs 04/12/2022   Mixed hyperlipidemia 04/12/2022   Acute cough 04/12/2022   Syncope 04/03/2022   Elevated troponin 04/03/2022   Avascular necrosis of bone of hip, left (HCC) 04/03/2022   Allergic rhinitis 04/03/2022   Eustachian tube dysfunction, left 10/06/2021   Bloody diarrhea 11/17/2017   History of oligodendroglioma of brain 03/22/2017   Status post  radiation therapy 03/22/2017   GERD (gastroesophageal reflux disease) 11/11/2016   Complex partial seizure (HCC) 12/21/2012   Oligodendroglioma (HCC) 10/14/2011   Seasonal allergies 07/08/2008   SMOKELESS TOBACCO ABUSE 08/02/2007   Intracranial tumor (HCC) 11/17/1999   Seizure disorder (HCC) 01/16/1997    ONSET DATE: 11/26/2022 (CVA)  REFERRING DIAG: I63.9 (ICD-10-CM) - Cerebral infarction, unspecified  THERAPY DIAG:  Other lack of coordination  Hemiplegia and hemiparesis following cerebral infarction affecting left non-dominant side (HCC)  Other abnormalities of gait and mobility  Muscle weakness (generalized)  Rationale for Evaluation and Treatment: Rehabilitation  SUBJECTIVE:  SUBJECTIVE STATEMENT: Pt reports he is having some sinus pressure today but otherwise doing well. Pt reports no falls and no acute changes since last visit.  Pt reports he has not been wearing his knee cage unless he is more fatigued and feels like he needs it. Pt has access to a gym [place] the fire station but is unsure what equipment they have available, will try to check before last visit next week.  Pt accompanied by: significant other-wife, [name]; (per pt he had a seizure back in April so cannot be cleared to drive for 6 months from that date)  PERTINENT HISTORY: L hip AVN (ortho not planning to address unless significant limitations in function-followed by [facility]), ankle ORIF x3, oligodendroglioma s/p Postoperative radiotherapy (XRT), complex partial seizures  Right hand dominant male who was admitted to [facility] on 11/23/2022 with 1 day history of left-sided numbness with left lower extremity weakness followed by sudden onset of dizziness and diarrhea.  He was found to have acute infarct in medulla.  PAIN:   Are you having pain? 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 2. Unchanged nonenhancing right temporal lobe mass, previously reported to represent an oligodendroglioma.  COGNITION: Overall cognitive status: Within functional limits for tasks assessed   SENSATION: Light touch: Impaired ; left side feels more dull compared to right  COORDINATION: LE RAMS:  dysmetric Heel-to-shin:  limited by L Hip AVN  EDEMA:  None noted in BLE  MUSCLE TONE: None noted during functional assessment  POSTURE: No Significant postural limitations  LOWER EXTREMITY ROM:     Active  Right Eval Left Eval  Hip flexion Grossly WFL  Hip extension   Hip abduction   Hip adduction   Hip internal rotation   Hip external rotation   Knee flexion   Knee extension   Ankle dorsiflexion   Ankle plantarflexion    Ankle inversion    Ankle eversion     (Blank rows = not tested)  LOWER EXTREMITY MMT:    MMT Right Eval Left Eval  Hip flexion 5/5 4+/5  Hip extension    Hip abduction " "  Hip  adduction " "  Hip internal rotation    Hip external rotation    Knee flexion " "  Knee extension " "  Ankle dorsiflexion " "  Ankle plantarflexion    Ankle inversion    Ankle eversion    (Blank rows = not tested)  BED MOBILITY:  Sit to supine Complete Independence Supine to sit Complete Independence Rolling to Right Complete Independence  Rolling to Left Complete Independence  TRANSFERS: Assistive device utilized: None  Sit to stand: Complete Independence Stand to sit: Complete Independence Chair to chair: Complete Independence  GAIT: Gait pattern: decreased hip/knee flexion- Left and genu recurvatum- Left Distance walked: various clinic distances Assistive device utilized: None Level of assistance: SBA Comments: Pt has one instance of left LE buckling stating his hip was catching and hurting.  FUNCTIONAL TESTS:  5 times sit to stand: 6.72 seconds no UE support, intermittent touch of back of legs to chair, no LOB 6 minute walk test: To be assessed. Functional gait assessment: To be assessed.  PATIENT SURVEYS:  FOTO not captured at intake.  TODAY'S TREATMENT:                                                                                                                               TherAct Treadmill training x 8 min  up to 6% incline with BUE support, 1.3 mph, 0.17 miles covered with good reciprocal mobility. Pt not wearing knee cage but good L knee control noted overall.  Y balance at ballet bar with no UE support tapping colored targets on the floor with alt LE 2 x 10 reps with alt LE Progression to stance on airex, 2 x 10 reps with alt LE  Therastones in // bars with progression to no UE support, 6 x 10 ft  TherEx Leg press with LLE 3 x 10-12 reps to fatigue at 45#    PATIENT EDUCATION: Education details: Continue using knee cage for pain and excessive hyperextension prevention-discussed that he may always have a little knee hyperextension especially if he  had this prior to CVA.  Continue HEP and walking regularly. Discussed pt figuring out what gym equipment is available to him prior to last session so therapy can discuss equipment he would be safe to use (already recommend treadmill, leg press) Person educated: Patient Education method: Explanation Education comprehension: verbalized understanding  HOME EXERCISE PROGRAM: Access Code: [email or web address] URL: [email or web address] Date: 12/15/2022 Prepared by: Peter Congo  Exercises - Staggered Sit-to-Stand  - 1 x daily - 7 x weekly - 3 sets - 10 reps - Standing Terminal Knee Extension with Resistance  - 1 x daily - 7 x weekly - 3 sets - 10 reps - Staggered Stance Squat  - 1 x daily - 7 x weekly - 3 sets - 10 reps - Forward Step Down with Heel Tap and Counter Support  - 1 x daily - 7 x weekly - 3 sets - 10 reps  GOALS: Goals reviewed with patient? Yes  SHORT TERM GOALS: Target date: 01/07/2023  Pt will ambulate greater than or equal to 1300 feet on with LRAD and mod I for improved cardiovascular endurance and BLE strength.  Baseline:  1114 ft (5/29); 1066 ft (6/26) Goal status: NOT MET  2.  Pt will improve FGA to 23/30 for decreased fall risk  Baseline: 20/30 (5/29); 20/30 (6/26) Goal  status: NOT MET  LONG TERM GOALS: Target date: 02/04/2023  Pt will be independent with strength and balance HEP to improve mobility and functional independence. Baseline: To be established. Goal status: INITIAL  2.  Pt will ambulate greater than or equal to 1300 feet on with LRAD and mod I for improved cardiovascular endurance and BLE strength.  Baseline: 1114 ft (5/29); 1066 ft (6/26) Goal status: REVISED-6/26  3.  Pt will improve FGA to 24/30 for decreased fall risk  Baseline: 20/30 (5/29); 20/30 (6/26) Goal status: REVISED-6/26  4.  Pt will ambulate >/=500 feet with LRAD over unlevel indoor and outdoor surfaces demonstrating improved left knee control at no more than modI  level of assist to promote household and community access. Baseline: various level clinic distances w/ left genu recurvatum wearing left knee cage Goal status: INITIAL  ASSESSMENT:  CLINICAL IMPRESSION: Emphasis of skilled PT session on continuing to work on BLE strengthening, SLS stability, and dynamic standing balance. Pt does exhibit ongoing shaking of his LLE with onset of fatigue as well as decreased stability and coordination of his LLE compared to his RLE. Pt continues to benefit from skilled therapy services to work towards LTGs and progress him to independently continuing with an exercise program post-d/c from [facility]. Continue POC.   OBJECTIVE IMPAIRMENTS: Abnormal gait, decreased activity tolerance, decreased balance, decreased coordination, decreased endurance, decreased knowledge of use of DME, difficulty walking, decreased strength, and impaired sensation.   ACTIVITY LIMITATIONS: carrying, lifting, squatting, and locomotion level  PARTICIPATION LIMITATIONS: driving, community activity, and occupation  PERSONAL FACTORS: Fitness, Past/current experiences, Profession, Sex, Transportation, and 1-2 comorbidities: Left hip AVN and right oligodendroglioma  are also affecting patient's functional outcome.   REHAB POTENTIAL: Excellent  CLINICAL DECISION MAKING: Evolving/moderate complexity  EVALUATION COMPLEXITY: Moderate  PLAN:  PT FREQUENCY: 1x/week  PT DURATION: 8 weeks  PLANNED INTERVENTIONS: Therapeutic exercises, Therapeutic activity, Neuromuscular re-education, Balance training, Gait training, Patient/Family education, Self Care, Stair training, Vestibular training, DME instructions, Manual therapy, and Re-evaluation  PLAN FOR NEXT SESSION: assess LTG and d/c, did pt see what equipment is available at fire station? If so could quickly review what equipment he would be safe to use (already recommended treadmill and leg press)   Peter Congo, PT, DPT, CSRS  02/02/2023,  11:45 AM

## 2023-02-02 NOTE — Therapy (Signed)
OUTPATIENT OCCUPATIONAL THERAPY NEURO TREATMENT  Patient Name: Corey Chang MRN: [id number] DOB:14-Jun-1981, 42 y.o., male Today's Date: 02/02/2023  PCP: Gweneth Dimitri, MD REFERRING PROVIDER: Charlton Amor, PA-C  END OF SESSION:  OT End of Session - 02/02/23 1023     Visit Number 9    Number of Visits 17    Date for OT Re-Evaluation 02/04/23    Authorization Type [employer] - Auth not Reqd    OT Start Time 1022    OT Stop Time 1100    OT Time Calculation (min) 38 min    Activity Tolerance Patient tolerated treatment well    Behavior During Therapy [facility] for tasks assessed/performed             Past Medical History:  Diagnosis Date   Allergic rhinitis 04/22/1997   Ankle fracture, right 07/2007   Brain cancer (HCC)    Cholelithiasis    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  He had external beam radiation in August 2001 with 54 Gy in 30 fractions on RTOG 9802 protocol.   He declined chemotherapy.    Seizure (HCC)    from history of oligodendroglioma   Past Surgical History:  Procedure Laterality Date   ANKLE SURGERY     right, x 3   brain biopsy  1997, 2001   Patient Active Problem List   Diagnosis Date Noted   Factor V Leiden mutation (HCC) 01/24/2023   Homocysteinemia 01/24/2023   Acute stroke of medulla oblongata (HCC) 11/26/2022   Hyponatremia 11/24/2022   Acute CVA (cerebrovascular accident) (HCC) 11/23/2022   Polycythemia 11/23/2022   Elevated LFTs 04/12/2022   Mixed hyperlipidemia 04/12/2022   Acute cough 04/12/2022   Syncope 04/03/2022   Elevated troponin 04/03/2022   Avascular necrosis of bone of hip, left (HCC) 04/03/2022   Allergic rhinitis 04/03/2022   Eustachian tube dysfunction, left 10/06/2021   Bloody diarrhea 11/17/2017   History of oligodendroglioma of brain 03/22/2017   Status post radiation therapy 03/22/2017   GERD (gastroesophageal reflux disease) 11/11/2016   Complex partial seizure (HCC) 12/21/2012    Oligodendroglioma (HCC) 10/14/2011   Seasonal allergies 07/08/2008   SMOKELESS TOBACCO ABUSE 08/02/2007   Intracranial tumor (HCC) 11/17/1999   Seizure disorder (HCC) 01/16/1997    ONSET DATE: Referral date: 12/06/2022   ONSET: 11/23/22  REFERRING DIAG: I63.9 (ICD-10-CM) - CVA (cerebral vascular accident)  THERAPY DIAG:  Other lack of coordination  Hemiplegia and hemiparesis following cerebral infarction affecting left non-dominant side (HCC)  Other disturbances of skin sensation  Other symptoms and signs involving the musculoskeletal system  Muscle weakness (generalized)  Rationale for Evaluation and Treatment: Rehabilitation OT Discharge from Hospital states: "Patient will benefit from ongoing skilled OT services in outpatient setting to continue to advance functional skills in the area of BADL, iADL, Vocation, and Reduce care partner burden."  SUBJECTIVE STATEMENT:  Pt states he finished his antibiotics and has not had as much swelling.    Pt accompanied by: significant other - Self   PERTINENT HISTORY:   42 yo male with history of oligodendroglioma s/p Postoperative radiotherapy (XRT), complex partial seizures, AVN left hip who was admitted to [facility] on 11/23/2022 with 1 day history of left-sided numbness with left lower extremity weakness followed by sudden onset of dizziness and diarrhea.  He was found to have acute infarct in medulla as well as unchanged nonenhancing right temporal lobe mass.    Principal Problem:   Acute stroke of medulla oblongata (HCC)  Active Problems:   Seizure disorder from history of oligodendroglioma   GERD (gastroesophageal reflux disease)   Allergic rhinitis Hyponatremia Polycythemia   Oligodendroglioma Left temporal; bx in 12/1999.  He had external beam radiation in August 2001 with 54 Gy in 30 fractions on RTOG 9802 protocol.   He declined chemotherapy.   Ankle surgery x 3   PRECAUTIONS: Fall and Other: currently not driving, h/o  seizures (managed by medication)  WEIGHT BEARING RESTRICTIONS: No  PAIN:  Are you having pain? 1/10 R foot; L hand  FALLS: Has patient fallen in last 6 months? YES  At evaluation - patient reports stumbling 1x by getting his foot hooked up in some laundry in the floor and stumbling against the wall.  Patient fell morning of 01/05/23 prior to therapy appointment.  He scraped his L arm on an open drawer due to leftover luggage on the floor and his dog getting in the way.   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  Currently off work x 6 months, and Leisure: hunting, fishing, watching kids sports   PATIENT GOALS: To be able to do more FM stuff with his left hand, for things to be easier   OBJECTIVE:   HAND DOMINANCE: Right  ADLs:  "At inpatient rehab admission, patient required min to mod assist with basic ADL tasks and min assist with mobility. He exhibited mild dysarthria and mild deficits in higher level tasks. He has had improvement in activity tolerance, balance, postural control as well as ability to compensate for deficits. He is able to complete ADL tasks at modified independent level. He was independent for transfers and is able to ambulate 300' with use of LLE Swedish Knee cage independently in supervised setting and with supervision in community setting. His speech intelligibility has improved and he is able to utilize strategies for clarity as well as swallowing at modified independent level.  He is tolerating regular diet without any signs of  aspiration."  Overall ADLs: Patient reports MI with ADLs although buttons do get frustrating. Transfers/ambulation related to ADLs: MI without knee brace at home Eating: R handed so able to hold fork in L hand to cut food with R hand Grooming: electric razor/trimmer UB Dressing: MI with some assistance for buttons LB Dressing: MI - managed jeans button/zipper and belt today Toileting: MI Bathing: MI - showers in standing generally, he did sit on shower seat today when he got a nose bleed Tub Shower transfers: walk in shower Equipment: Shower seat with back  IADLs:            Shopping: currently relying on spouse Light housekeeping: currently relying on spouse due to standing for long periods of time being difficult due to L hip discomfort Meal Prep: currently relying on spouse - did not have activity tolerance this morning to try and cook eggs Community mobility: depends on spouse - RE: Driving - MD told him to wait 2 weeks and then [place] before resuming driving Medication management: wife puts them in a medicine box due to coordination difficulties Financial  management: shares with spouse - mostly via phone Handwriting: 100% legible - R handed  MOBILITY STATUS: Independent and wears knee brace for long distance but not in the house  POSTURE COMMENTS:  No Significant postural limitations Sitting balance:  WFL  ACTIVITY TOLERANCE: Activity tolerance: LIMITED -- Had cleaned off the stove and washed something to make eggs this morning but needed to sit and rest.  FUNCTIONAL OUTCOME MEASURES (Eval): Upper Extremity Functional Scale (UEFS): 68 as rated by patient and 48 by spouse  0 to 80 score range of the UEFS -- Lower scores indicate that the subject is reporting increased difficulty with the activities as a result of their upper limb condition. While higher scores indicate less severity.   FOTO Severity: Slight (Intake FS: 60)    UPPER EXTREMITY  ROM:    A/ROM - WNL L UE moves slower than R UE but achieves same ROM  UPPER EXTREMITY MMT:     R UE - 5/5 L UE - 4 to 4+/5  HAND FUNCTION: Grip strength: Right: 166.8 lbs; Left: 101.1  lbs, Lateral pinch: Right: 30 lbs, Left: 24 lbs, and 3 point pinch: Right: 30 lbs, Left: 20 lbs Hospital DC last week:  R Dominant Hand:  Average: 169.33 lbs L Nondominant (hemiparetic) Hand: Average: 91.67 lbs  COORDINATION: 9 Hole Peg test: Right: 33.94  sec; Left: 102.15 sec Box and Blocks:  Right 46 blocks, Left 27 blocks Used R hand to help L hand on occasion See goals for update from 01/05/23  SENSATION: Light touch: Impaired  Hot/Cold: Impaired   Patient was able to feel light touch on all fingers at least 1/3 times but had difficulty with median nerve distribution with lightest touch and would benefit from more formal testing.  He self reports hot and cold not being as distinct on his L side but noting improvements everyday.   EDEMA: No swelling  MUSCLE TONE: WFL  COGNITION: Overall cognitive status: Within functional limits for tasks assessed  VISION: Subjective report: No issues Baseline vision: No visual deficits Visual history:  N/A  VISION ASSESSMENT: WFL  PERCEPTION: WFL  PRAXIS: WFL  Evaluation OBSERVATIONS: Patient arrives today with his wife with L knee brace in place and ambulates without AE from lobby to therapy gym.   No loss of balance noted during mobility tasks during short observations of mobility in clinic. He demonstrates full ROM of BUE although L UE moves at a slower rate than R UE and has obvious coordination deficits as noted by time to compete 9 hole peg test at 3x the time for R UE.    TODAY'S TREATMENT:        - Therapeutic activities completed for duration as noted below including:                                                                                                                         Patient used L hand to assemble medium [employer], and then  removed 4 pieces individually until the tower fell over for fine motor coordination of affected extremity.  With use of L, pt placed and then removed colored, medium pegs into corresponding hole with use of pattern sheets for ROM, coordination, and strength of affected extremity. Pt picked up 3 pegs, stored them in hand, and then placed pegs one at a time using 2 point pinch for additional fine motor challenge.  Wrist flex and ext with tan flex bar x 2 min for strength and endurance of affected extremity  Supination with tan flex bar x 2 min for strength and endurance of affected extremity  Pronation with tan flex bar x 2 min for strength and endurance of affected extremity  PATIENT EDUCATION: Education details: coordination  Person educated: Patient Education method: Explanation, demonstration, Verbal cues Education comprehension: verbalized understanding, return demonstration and needs further education  HOME EXERCISE PROGRAM: 12/15/22 -- Coordination handouts with images Access Code: [email or web address] URL: [email or web address] Date: 12/15/2022 * Finger Pinch and Pull with Putty  - 1 x daily - 3 sets - 10 reps  01/12/23: scapula stabilization/strengthening HEP   GOALS: Goals reviewed with patient? Yes   SHORT TERM GOALS: Target date: 01/05/23  Will improve buttoning 4 buttons in less than 1 minute  and improve score on UEFI from little difficulty to no difficulty.  Baseline: 1:28  button/unbutton (1:20 to fasten & < 10 seconds to unfasten) Goal status: MET 01/05/23 fasten 42 secs - 56 seconds total time    2.  Patient will complete nine-hole peg with use of L UE in less than 1 minute.  Baseline: 102 seconds Goal status: IN PROGRESS 01/04/23: 85 seconds  3.  Pt will be able to place at least 32 blocks using left hand with completion of Box and Blocks test.  Baseline: 27 blocks Goal status: MET 01/04/23: 32 blocks   4.  Following sensory testing (2 pt  discrimination and stereognosis 12/15/22) Patient will identify 5/5 items with vision occluded in L hand. Baseline: 0/3 items, Light touch 6-8 mm fingertips and self reported decreased awareness of hot/cold. Goal status: REVISED 01/05/23 - 3/6 items   5.  Patient will report no difficulty with tasks from UEFI test including lifting grocery bags, preparing food, and helping with IADLS in standing for improved UEFI score x 5 points. Baseline: Patient 68/80 & Spouse 48/80 Goal status: IN PROGRESS  6.  Patient will have increased activity tolerance for standing 15 minutes while moving heavy objects (15+ pounds) to simulate work tasks. Baseline: Patient had to sit after getting dishes ready to make eggs this morning Goal status: MET 01/05/23 - Reports carrying 2 - 50 lb salt bags, one in each hand with plastic handle from house to the pool - down a driveway 30-50 yards without rest break.   LONG TERM GOALS: Target date: 02/04/23  Patient will complete nine-hole peg with use of L in 45 seconds or less.  Baseline: 102 seconds Goal status: IN PROGRESS 01/05/23 - 85 seconds  2.  Pt will be able to place at least >35 blocks using left hand with completion of Box and Blocks test.  Baseline: 27 blocks Goal status: IN PROGRESS 01/05/23 - 32 blocks  3.  Patient will report no difficulty with tasks from UEFI test for score of > 75/80 for minimal residual deficits/difficulties. Baseline: Patient 68/80 & Spouse 48/80 Goal status: IN PROGRESS  4.  Patient will identify 3 compensatory techniques to be MI with any sensory compensatory techniques for any residual  deficits to minimize risk of self-injury. Baseline: Minor light touch deficits and temperature deficits Goal status: IN PROGRESS  5.  Patient will be MI with all HEPs for high level coordination and sustained activity tolerance. Baseline: Aware of some exercises from hospital  Goal status: IN PROGRESS   6.  Patient will have increased activity  tolerance for standing 30 minutes while moving heavy objects (30+ pounds) to simulate work tasks and/or complete chores at home as appropriate (laundry/meal prep etc) Baseline:  Goal status: MET Carried 2 - 50 lb salt bags, one in each hand with plastic handle from house to the pool - down a driveway 30-50 yards without rest break.   ASSESSMENT:  CLINICAL IMPRESSION: Pt likely to be appropriate for OT d/c at next visit dependent on goal progression. Pt appears most limited by sensory impairment, which likely will take additional time and completion of HEP for remediation efforts.   PERFORMANCE DEFICITS: in functional skills including ADLs, IADLs, coordination, dexterity, proprioception, sensation, Fine motor control, endurance, and UE functional use,   IMPAIRMENTS: are limiting patient from ADLs, IADLs, work, leisure, and social participation.   CO-MORBIDITIES: has co-morbidities such as h.o seizures and  unchanged nonenhancing right temporal lobe mass.that affects occupational performance. Patient will benefit from skilled OT to address above impairments and improve overall function.  REHAB POTENTIAL: Excellent  PLAN:  OT FREQUENCY: 1-2x/week  OT DURATION: 8 weeks  PLANNED INTERVENTIONS: self care/ADL training, therapeutic exercise, therapeutic activity, neuromuscular re-education, functional mobility training, patient/family education, energy conservation, coping strategies training, and DME and/or AE instructions  RECOMMENDED OTHER SERVICES: Patient scheduled for PT/ST evaluations on Friday  CONSULTED AND AGREED WITH PLAN OF CARE: Patient and family member/caregiver  PLAN FOR NEXT SESSION: Review Goals with potential D/C vs extend   Delana Meyer, OT 02/02/2023, 10:25 AM

## 2023-02-09 ENCOUNTER — Encounter: Payer: Self-pay | Admitting: Physical Therapy

## 2023-02-09 ENCOUNTER — Ambulatory Visit: Payer: BC Managed Care – PPO | Admitting: Occupational Therapy

## 2023-02-09 ENCOUNTER — Ambulatory Visit: Payer: BC Managed Care – PPO | Admitting: Physical Therapy

## 2023-02-09 VITALS — BP 125/84 | HR 79

## 2023-02-09 DIAGNOSIS — R278 Other lack of coordination: Secondary | ICD-10-CM

## 2023-02-09 DIAGNOSIS — R29898 Other symptoms and signs involving the musculoskeletal system: Secondary | ICD-10-CM

## 2023-02-09 DIAGNOSIS — R208 Other disturbances of skin sensation: Secondary | ICD-10-CM

## 2023-02-09 DIAGNOSIS — M6281 Muscle weakness (generalized): Secondary | ICD-10-CM

## 2023-02-09 DIAGNOSIS — R2689 Other abnormalities of gait and mobility: Secondary | ICD-10-CM

## 2023-02-09 DIAGNOSIS — I69354 Hemiplegia and hemiparesis following cerebral infarction affecting left non-dominant side: Secondary | ICD-10-CM

## 2023-02-09 DIAGNOSIS — R29818 Other symptoms and signs involving the nervous system: Secondary | ICD-10-CM

## 2023-02-09 NOTE — Therapy (Signed)
OUTPATIENT OCCUPATIONAL THERAPY NEURO TREATMENT & PROGRESS NOTE  Patient Name: Corey Chang Class MRN: [id number] DOB:18-Apr-1981, 42 y.o., male Today's Date: 02/09/2023  PCP: Gweneth Dimitri, MD REFERRING PROVIDER: Charlton Amor, PA-C  END OF SESSION:  OT End of Session - 02/09/23 1021     Visit Number 10    Number of Visits 17    Date for OT Re-Evaluation 02/04/23    Authorization Type [employer] - Auth not Reqd    OT Start Time 1020    OT Stop Time 1100    OT Time Calculation (min) 40 min    Equipment Utilized During Treatment Testing Material    Activity Tolerance Patient tolerated treatment well    Behavior During Therapy [facility] for tasks assessed/performed             Past Medical History:  Diagnosis Date   Allergic rhinitis 04/22/1997   Ankle fracture, right 07/2007   Brain cancer (HCC)    Cholelithiasis    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  He had external beam radiation in August 2001 with 54 Gy in 30 fractions on RTOG 9802 protocol.   He declined chemotherapy.    Seizure (HCC)    from history of oligodendroglioma   Past Surgical History:  Procedure Laterality Date   ANKLE SURGERY     right, x 3   brain biopsy  1997, 2001   Patient Active Problem List   Diagnosis Date Noted   Factor V Leiden mutation (HCC) 01/24/2023   Homocysteinemia 01/24/2023   Acute stroke of medulla oblongata (HCC) 11/26/2022   Hyponatremia 11/24/2022   Acute CVA (cerebrovascular accident) (HCC) 11/23/2022   Polycythemia 11/23/2022   Elevated LFTs 04/12/2022   Mixed hyperlipidemia 04/12/2022   Acute cough 04/12/2022   Syncope 04/03/2022   Elevated troponin 04/03/2022   Avascular necrosis of bone of hip, left (HCC) 04/03/2022   Allergic rhinitis 04/03/2022   Eustachian tube dysfunction, left 10/06/2021   Bloody diarrhea 11/17/2017   History of oligodendroglioma of brain 03/22/2017   Status post radiation therapy 03/22/2017   GERD (gastroesophageal reflux  disease) 11/11/2016   Complex partial seizure (HCC) 12/21/2012   Oligodendroglioma (HCC) 10/14/2011   Seasonal allergies 07/08/2008   SMOKELESS TOBACCO ABUSE 08/02/2007   Intracranial tumor (HCC) 11/17/1999   Seizure disorder (HCC) 01/16/1997    ONSET DATE: Referral date: 12/06/2022   ONSET: 11/23/22  REFERRING DIAG: I63.9 (ICD-10-CM) - CVA (cerebral vascular accident)  THERAPY DIAG:  Other lack of coordination  Other disturbances of skin sensation  Other symptoms and signs involving the musculoskeletal system  Muscle weakness (generalized)  Other symptoms and signs involving the nervous system  Hemiplegia and hemiparesis following cerebral infarction affecting left non-dominant side (HCC)  Rationale for Evaluation and Treatment: Rehabilitation OT Discharge from Hospital states: "Patient will benefit from ongoing skilled OT services in outpatient setting to continue to advance functional skills in the area of BADL, iADL, Vocation, and Reduce care partner burden."  SUBJECTIVE STATEMENT:  Pt reports going to the beach and getting a burn on his legs despite staying under the umbrella.  Upon discussion re: continued therapy, patient reported the following activities as being needed to be able to type at computer, use lots of hand tools, paint/carry and move large signs (200-300 lbs) and do a lot of cleaning up ie) sweeping up after drilling signs etc.    Pt accompanied by: significant other - Self   PERTINENT HISTORY:   42 yo male  with history of oligodendroglioma s/p Postoperative radiotherapy (XRT), complex partial seizures, AVN left hip who was admitted to [facility] on 11/23/2022 with 1 day history of left-sided numbness with left lower extremity weakness followed by sudden onset of dizziness and diarrhea.  He was found to have acute infarct in medulla as well as unchanged nonenhancing right temporal lobe mass.    Principal Problem:   Acute stroke of medulla oblongata  (HCC) Active Problems:   Seizure disorder from history of oligodendroglioma   GERD (gastroesophageal reflux disease)   Allergic rhinitis Hyponatremia Polycythemia   Oligodendroglioma Left temporal; bx in 12/1999.  He had external beam radiation in August 2001 with 54 Gy in 30 fractions on RTOG 9802 protocol.   He declined chemotherapy.   Ankle surgery x 3   PRECAUTIONS: Fall and Other: currently not driving, h/o seizures (managed by medication)  WEIGHT BEARING RESTRICTIONS: No  PAIN:  Are you having pain? 1/10 R foot; L hand  FALLS: Has patient fallen in last 6 months? YES  At evaluation - patient reports stumbling 1x by getting his foot hooked up in some laundry in the floor and stumbling against the wall.  Patient fell morning of 01/05/23 prior to therapy appointment.  He scraped his L arm on an open drawer due to leftover luggage on the floor and his dog getting in the way.   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  Currently off work x 6 months, and Leisure: hunting, fishing, watching kids sports   PATIENT GOALS: To be able to do more FM stuff with his left hand, for things to be easier   OBJECTIVE:   HAND DOMINANCE: Right  ADLs:  "At inpatient rehab admission, patient required min to mod assist with basic ADL tasks and min assist with mobility. He exhibited mild dysarthria and mild deficits in higher level tasks. He has had improvement in activity tolerance, balance, postural control as well as ability to compensate for deficits. He is able to  complete ADL tasks at modified independent level. He was independent for transfers and is able to ambulate 300' with use of LLE Swedish Knee cage independently in supervised setting and with supervision in community setting. His speech intelligibility has improved and he is able to utilize strategies for clarity as well as swallowing at modified independent level.  He is tolerating regular diet without any signs of aspiration."  Overall ADLs: Patient reports MI with ADLs although buttons do get frustrating. Transfers/ambulation related to ADLs: MI without knee brace at home Eating: R handed so able to hold fork in L hand to cut food with R hand Grooming: electric razor/trimmer UB Dressing: MI with some assistance for buttons LB Dressing: MI - managed jeans button/zipper and belt today Toileting: MI Bathing: MI - showers in standing generally, he did sit on shower seat today when he got a nose bleed Tub Shower transfers: walk in shower Equipment: Shower seat with back  IADLs:            Shopping: currently relying on spouse Light housekeeping: currently relying on spouse due to standing  for long periods of time being difficult due to L hip discomfort Meal Prep: currently relying on spouse - did not have activity tolerance this morning to try and cook eggs Community mobility: depends on spouse - RE: Driving - MD told him to wait 2 weeks and then [place] before resuming driving Medication management: wife puts them in a medicine box due to coordination difficulties Financial management: shares with spouse - mostly via phone Handwriting: 100% legible - R handed  MOBILITY STATUS: Independent and wears knee brace for long distance but not in the house  POSTURE COMMENTS:  No Significant postural limitations Sitting balance:  WFL  ACTIVITY TOLERANCE: Activity tolerance: LIMITED -- Had cleaned off the stove and washed something to make eggs this morning but needed to  sit and rest.  FUNCTIONAL OUTCOME MEASURES (Eval): Upper Extremity Functional Scale (UEFS): 68 as rated by patient and 48 by spouse 02/09/23 - UEFI 56 as related by patient (more self-aware of deficits)   0 to 80 score range of the UEFS -- Lower scores indicate that the subject is reporting increased difficulty with the activities as a result of their upper limb condition. While higher scores indicate less severity.   FOTO Severity: Slight (Intake FS: 60)    UPPER EXTREMITY ROM:    A/ROM - WNL L UE moves slower than R UE but achieves same ROM  UPPER EXTREMITY MMT:     R UE - 5/5 L UE - 4 to 4+/5  HAND FUNCTION: Grip strength: Right: 166.8 lbs; Left: 101.1  lbs, Lateral pinch: Right: 30 lbs, Left: 24 lbs, and 3 point pinch: Right: 30 lbs, Left: 20 lbs Hospital DC last week:  R Dominant Hand:  Average: 169.33 lbs L Nondominant (hemiparetic) Hand: Average: 91.67 lbs  COORDINATION: 9 Hole Peg test: Right: 33.94  sec; Left: 102.15 sec Box and Blocks:  Right 46 blocks, Left 27 blocks Used R hand to help L hand on occasion See goals for update from 01/05/23  SENSATION: Light touch: Impaired  Hot/Cold: Impaired   Patient was able to feel light touch on all fingers at least 1/3 times but had difficulty with median nerve distribution with lightest touch and would benefit from more formal testing.  He self reports hot and cold not being as distinct on his L side but noting improvements everyday.   EDEMA: No swelling  MUSCLE TONE: WFL  COGNITION: Overall cognitive status: Within functional limits for tasks assessed  VISION: Subjective report: No issues Baseline vision: No visual deficits Visual history:  N/A  VISION ASSESSMENT: WFL  PERCEPTION: WFL  PRAXIS: WFL  Evaluation OBSERVATIONS: Patient arrives today with his wife with L knee brace in place and ambulates without AE from lobby to therapy gym.   No loss of balance noted during mobility tasks during short  observations of mobility in clinic. He demonstrates full ROM of BUE although L UE moves at a slower rate than R UE and has obvious coordination deficits as noted by time to compete 9 hole peg test at 3x the time for R UE.    TODAY'S TREATMENT:        - Therapeutic activities completed for duration as noted below  Reassessed FM tasks with slight improvement in Box and Blocks test but significant improvement in 9 hole pegboard test:  Box and Blocks test.  Baseline@ eval: 27 blocks  01/05/23 - 32 blocks   Today - 34 blocks  Nine-hole peg LUE Baseline@  eval: 102 seconds  01/05/23 - 85 seconds    Today - 57.40 seconds  Reviewed goals including Upper Extremity Functional Scale (UEFS): which he self rated @ 68 /80 at eval,whereas spouse rated him at 48/80.  Today patient related himself has having mild to moderate difficulty with most tasks still with LUE and score was lower 56/80 but more likely accurate r/t self-awareness of deficits.    Patient engaged in progression of finger dexterity and FM tasks for in hand manipulation, isolate finger movements to rotate/move items in hands as well as to work on locating items in putty etc.   PATIENT EDUCATION: Education details: coordination  Person educated: Patient Education method: Explanation, demonstration, Verbal cues Education comprehension: verbalized understanding, return demonstration and needs further education  HOME EXERCISE PROGRAM: 12/15/22 -- Coordination handouts with images Access Code: [email or web address] URL: [email or web address] Date: 12/15/2022 * Finger Pinch and Pull with Putty  - 1 x daily - 3 sets - 10 reps  01/12/23: scapula stabilization/strengthening HEP   GOALS: Goals reviewed with patient? Yes   SHORT TERM GOALS: Target date: 01/05/23  Will improve buttoning 4 buttons in less than 1 minute  and improve score on UEFI from little difficulty to no difficulty.  Baseline: 1:28  button/unbutton (1:20 to fasten & < 10  seconds to unfasten) Goal status: MET 01/05/23 fasten 42 secs - 56 seconds total time    2.  Patient will complete nine-hole peg with use of L UE in less than 1 minute.  Baseline: 102 seconds Goal status: MET 01/04/23: 85 seconds 02/09/23 - 57.40 seconds  3.  Pt will be able to place at least 32 blocks using left hand with completion of Box and Blocks test.  Baseline: 27 blocks Goal status: MET 01/04/23: 32 blocks  4.  Following sensory testing (2 pt discrimination and stereognosis 12/15/22) Patient will identify 5/5 items with vision occluded in L hand. Baseline: 0/3 items, Light touch 6-8 mm fingertips and self reported decreased awareness of hot/cold. Goal status: REVISED 01/05/23 - 3/6 items   5.  Patient will report no difficulty with tasks from UEFI test including lifting grocery bags, preparing food, and helping with IADLS in standing for improved UEFI score x 5 points. Baseline: Patient 68/80 & Spouse 48/80 Goal status: REVISED - see long term goal 02/09/23: Patient 56/80 (more self-aware of deficits)  6.  Patient will have increased activity tolerance for standing 15 minutes while moving heavy objects (15+ pounds) to simulate work tasks. Baseline: Patient had to sit after getting dishes ready to make eggs this morning Goal status: MET 01/05/23 - Reports carrying 2 - 50 lb salt bags, one in each hand with plastic handle from house to the pool - down a driveway 30-50 yards without rest break.   LONG TERM GOALS: Target date: 02/04/23  Patient will complete nine-hole peg with use of L in 45 seconds or less.  Baseline: 102 seconds Goal status: IN PROGRESS 01/05/23 - 85 seconds 02/09/23 - 57.40 seconds  2.  Pt will be able to place at least >35 blocks using left hand with completion of Box and Blocks test.  Baseline: 27 blocks Goal status: IN PROGRESS 01/05/23 - 32 blocks 02/09/23 - 34 blocks  3.  Patient will report no difficulty with tasks from UEFI test for score of > 75/80 for  minimal residual deficits/difficulties. Baseline: Patient 68/80 & Spouse 48/80 Goal status: IN PROGRESS  4.  Patient will identify 3 compensatory techniques to be MI with any sensory  compensatory techniques for any residual deficits to minimize risk of self-injury. Baseline: Minor light touch deficits and temperature deficits Goal status: IN PROGRESS 01/05/23 - Identifies 3/6 items   5.  Patient will be MI with all HEPs for high level coordination and sustained activity tolerance. Baseline: Aware of some exercises from hospital  Goal status: MET  6.  Patient will have increased activity tolerance for standing 30 minutes while moving heavy objects (30+ pounds) to simulate work tasks and/or complete chores at home as appropriate (laundry/meal prep etc) Baseline:  Goal status: MET Carried 2 - 50 lb salt bags, one in each hand with plastic handle from house to the pool - down a driveway 30-50 yards without rest break.   ASSESSMENT:  CLINICAL IMPRESSION:  This 10th progress note is for dates: 12/08/22 to 02/09/2023. Pt has met 3/5 STGs and 2/6 LTGs but is making progress in all areas and some goals have been revised along previous therapy sessions. Pt is expected to continue to make progress towards goals and will continue to benefit from skilled OT services in the outpatient setting to work towards remaining goals or until max rehab potential is met.  Pt not appropriate for DC from OT at this time as his FM skills and sensory impairments continue to limit is ability to return to work still at this time and therefore recertification is being sent for MD approval.    PERFORMANCE DEFICITS: in functional skills including ADLs, IADLs, coordination, dexterity, proprioception, sensation, Fine motor control, endurance, and UE functional use,   IMPAIRMENTS: are limiting patient from ADLs, IADLs, work, leisure, and social participation.   CO-MORBIDITIES: has co-morbidities such as h.o seizures and   unchanged nonenhancing right temporal lobe mass.that affects occupational performance. Patient will benefit from skilled OT to address above impairments and improve overall function.  REHAB POTENTIAL: Excellent  PLAN:  OT FREQUENCY: 1x/week  OT DURATION: 8 weeks  PLANNED INTERVENTIONS: self care/ADL training, therapeutic exercise, therapeutic activity, neuromuscular re-education, functional mobility training, patient/family education, energy conservation, coping strategies training, and DME and/or AE instructions  RECOMMENDED OTHER SERVICES: Patient also going to continue PT services at this time.  CONSULTED AND AGREED WITH PLAN OF CARE: Patient and family member/caregiver  PLAN FOR NEXT SESSION:  Continue FM HEP ideas for max in hand manipulation, isolation of individual L digits and compensation for sensory deficits.  Progress HEP for higher level carrying and tool use with AE as appropriate for job relate tasks.    Victorino Sparrow, OT 02/09/2023, 6:56 PM

## 2023-02-09 NOTE — Therapy (Signed)
OUTPATIENT PHYSICAL THERAPY NEURO TREATMENT - Re-cert   Patient Name: Corey Chang MRN: [id number] DOB:1981-04-10, 42 y.o., male Today's Date: 02/09/2023  PCP: Eden Emms, MD REFERRING PROVIDER: Charlton Amor, PA-C  END OF SESSION:  PT End of Session - 02/09/23 1108     Visit Number 9    Number of Visits 17   9 + 8 at re-cert   Date for PT Re-Evaluation 04/22/23   pushed out to accomodate scheduling w/ OT   Authorization Type [employer]    PT Start Time 1101   handoff from OT   PT Stop Time 1146    PT Time Calculation (min) 45 min    Equipment Utilized During Treatment Gait belt   Left knee cage   Activity Tolerance Patient tolerated treatment well    Behavior During Therapy [facility] 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  He had external beam radiation in August 2001 with 54 Gy in 30 fractions on RTOG 9802 protocol.   He declined chemotherapy.    Seizure (HCC)    from history of oligodendroglioma   Past Surgical History:  Procedure Laterality Date   ANKLE SURGERY     right, x 3   brain biopsy  1997, 2001   Patient Active Problem List   Diagnosis Date Noted   Factor V Leiden mutation (HCC) 01/24/2023   Homocysteinemia 01/24/2023   Acute stroke of medulla oblongata (HCC) 11/26/2022   Hyponatremia 11/24/2022   Acute CVA (cerebrovascular accident) (HCC) 11/23/2022   Polycythemia 11/23/2022   Elevated LFTs 04/12/2022   Mixed hyperlipidemia 04/12/2022   Acute cough 04/12/2022   Syncope 04/03/2022   Elevated troponin 04/03/2022   Avascular necrosis of bone of hip, left (HCC) 04/03/2022   Allergic rhinitis 04/03/2022   Eustachian tube dysfunction, left 10/06/2021   Bloody diarrhea 11/17/2017   History of oligodendroglioma of  brain 03/22/2017   Status post radiation therapy 03/22/2017   GERD (gastroesophageal reflux disease) 11/11/2016   Complex partial seizure (HCC) 12/21/2012   Oligodendroglioma (HCC) 10/14/2011   Seasonal allergies 07/08/2008   SMOKELESS TOBACCO ABUSE 08/02/2007   Intracranial tumor (HCC) 11/17/1999   Seizure disorder (HCC) 01/16/1997    ONSET DATE: 11/26/2022 (CVA)  REFERRING DIAG: I63.9 (ICD-10-CM) - Cerebral infarction, unspecified  THERAPY DIAG:  Other lack of coordination  Hemiplegia and hemiparesis following cerebral infarction affecting left non-dominant side (HCC)  Muscle weakness (generalized)  Other abnormalities of gait and mobility  Rationale for Evaluation and Treatment: Rehabilitation  SUBJECTIVE:  SUBJECTIVE STATEMENT: Pt has recently had a sunburn, but otherwise no changes.  He denies falls or pain.  [place]  He has not been wearing the knee cage recently and presents to clinic without this today.  Left hand is almost healed and he is off antibiotics now.  Pt accompanied by: significant other-wife, [name]; (per pt he had a seizure back in April so cannot be cleared to drive for 6 months from that date)  PERTINENT HISTORY: L hip AVN (ortho not planning to address unless significant limitations in function-followed by [facility]), ankle ORIF x3, oligodendroglioma s/p Postoperative radiotherapy (XRT), complex partial seizures  Right hand dominant male who was admitted to [facility] on 11/23/2022 with 1 day history of left-sided numbness with left lower extremity weakness followed by sudden onset of dizziness and diarrhea.  He was found to have acute infarct in medulla.  PAIN:  Are you having pain? 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 2. Unchanged nonenhancing right temporal lobe mass, previously reported to represent an oligodendroglioma.  COGNITION: Overall cognitive status: Within functional limits for tasks assessed   SENSATION: Light touch: Impaired ; left side feels more dull compared to right  COORDINATION: LE RAMS:  dysmetric Heel-to-shin:  limited by L Hip AVN  EDEMA:  None noted in BLE  MUSCLE TONE: None noted during functional assessment  POSTURE: No Significant postural limitations  LOWER EXTREMITY ROM:     Active  Right Eval Left Eval  Hip flexion Grossly WFL  Hip extension   Hip abduction   Hip adduction   Hip internal rotation   Hip external rotation   Knee flexion   Knee extension   Ankle dorsiflexion   Ankle plantarflexion    Ankle inversion    Ankle eversion     (Blank rows = not tested)  LOWER EXTREMITY MMT:    MMT Right Eval Left Eval  Hip flexion 5/5 4+/5  Hip extension    Hip abduction " "  Hip adduction " "  Hip internal rotation    Hip external rotation     Knee flexion " "  Knee extension " "  Ankle dorsiflexion " "  Ankle plantarflexion    Ankle inversion    Ankle eversion    (Blank rows = not tested)  BED MOBILITY:  Sit to supine Complete Independence Supine to sit Complete Independence Rolling to Right Complete Independence Rolling to Left Complete Independence  TRANSFERS: Assistive device utilized: None  Sit to stand: Complete Independence Stand to sit:  Complete Independence Chair to chair: Complete Independence  GAIT: Gait pattern: decreased hip/knee flexion- Left and genu recurvatum- Left Distance walked: various clinic distances Assistive device utilized: None Level of assistance: SBA Comments: Pt has one instance of left LE buckling stating his hip was catching and hurting.  FUNCTIONAL TESTS:  5 times sit to stand: 6.72 seconds no UE support, intermittent touch of back of legs to chair, no LOB 6 minute walk test: To be assessed. Functional gait assessment: To be assessed.  PATIENT SURVEYS:  FOTO not captured at intake.  TODAY'S TREATMENT:                                                                                                                               [employer] -Verbally reviewed HEP, patient does not need printoff.  He endorses continued quad quiver, but successful performance. - no AD:  964 ft SBA-CGA -FGA:  [facility] PT Assessment - 02/09/23 1131       Functional Gait  Assessment   Gait assessed  Yes    Gait Level Surface Walks 20 ft in less than 7 sec but greater than 5.5 sec, uses assistive device, slower speed, mild gait deviations, or deviates 6-10 in outside of the 12 in walkway width.    Change in Gait Speed Able to smoothly change walking speed without loss of balance or gait deviation. Deviate no more than 6 in outside of the 12 in walkway width.    Gait with Horizontal Head Turns Performs head turns smoothly with no change in gait. Deviates no more than 6 in outside 12 in walkway width     Gait with Vertical Head Turns Performs head turns with no change in gait. Deviates no more than 6 in outside 12 in walkway width.    Gait and Pivot Turn Pivot turns safely within 3 sec and stops quickly with no loss of balance.    Step Over Obstacle Is able to step over one shoe box (4.5 in total height) without changing gait speed. No evidence of imbalance.    Gait with Narrow Base of Support Ambulates 4-7 steps.   5 steps without LOB   Gait with Eyes Closed Walks 20 ft, uses assistive device, slower speed, mild gait deviations, deviates 6-10 in outside 12 in walkway width. Ambulates 20 ft in less than 9 sec but greater than 7 sec.    Ambulating Backwards Walks 20 ft, uses assistive device, slower speed, mild gait deviations, deviates 6-10 in outside 12 in walkway width.    Steps Alternating feet, must use rail.    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  Discussed safety with bowling stride  as he is going bowling today. Person educated: Patient Education method: Explanation Education comprehension: verbalized understanding  HOME EXERCISE PROGRAM: Access Code: [email or web address] URL: [email or web address] Date: 12/15/2022 Prepared by: Peter Congo  Exercises - Staggered Sit-to-Stand  - 1 x daily - 7 x weekly - 3 sets - 10 reps - Standing Terminal Knee Extension with Resistance  - 1 x daily - 7 x weekly - 3 sets - 10 reps - Staggered Stance Squat  - 1 x daily - 7 x weekly - 3 sets - 10 reps - Forward Step Down with Heel Tap and Counter Support  - 1 x daily - 7 x weekly - 3 sets - 10 reps  GOALS: Goals reviewed with patient? Yes  SHORT TERM GOALS: Target date: 01/07/2023  Pt will ambulate greater than or equal to 1300 feet on with  LRAD and mod I for improved cardiovascular endurance and BLE strength.  Baseline:  1114 ft (5/29); 1066 ft (6/26) Goal status: NOT MET  2.  Pt will improve FGA to 23/30 for decreased fall risk  Baseline: 20/30 (5/29); 20/30 (6/26) Goal status: NOT MET  LONG TERM GOALS: Target date: 02/04/2023  Pt will be independent with strength and balance HEP to improve mobility and functional independence. Baseline: IND (7/24) Goal status: MET  2.  Pt will ambulate greater than or equal to 1300 feet on with LRAD and mod I for improved cardiovascular endurance and BLE strength.  Baseline: 1114 ft (5/29); 1066 ft (6/26); 964 ft SBA-CGA (7/24) Goal status: NOT MET  3.  Pt will improve FGA to 24/30 for decreased fall risk  Baseline: 20/30 (5/29); 20/30 (6/26); 23/30 (7/24) Goal status: IN PROGRESS  4.  Pt will ambulate >/=500 feet with LRAD over unlevel indoor and outdoor surfaces demonstrating improved left knee control at no more than modI level of assist to promote household and community access. Baseline: various level clinic distances w/ left genu recurvatum wearing left knee cage; level surfaces 964 ft no AD SBA-CGA 3 instances of right toe catch (7/24) Goal status: NOT MET  NEW GOALS:  Goals reviewed with patient? Yes  SHORT TERM GOALS:   Target date: 03/11/2023  Pt will ambulate >/=1200 feet on to demonstrate improved functional endurance for home and community participation. Baseline: 964 ft SBA-CGA (7/24) Goal status: INITIAL  LONG TERM GOALS:  Target date: 04/08/2023  Pt will be independent and compliant with advanced strength and balance HEP in order to maintain functional progress and improve mobility for return to work as able. Baseline: To be advanced. Goal status: INITIAL  2.  Pt will improve FGA score to >/=26/30 in order to demonstrate improved balance and decreased fall risk. Baseline: 23/30 (7/24) Goal status: INITIAL  3.  Pt will ambulate >/=500 feet with LRAD  over unlevel indoor and outdoor surfaces demonstrating improved left knee control at no more than modI level of assist to promote household and community access. Baseline: level surfaces 964 ft no AD SBA-CGA 3 instances of right toe catch (7/24) Goal status: INITIAL  4.  Patient will carry >/=20 lbs for at least 250 feet in order to demonstrate improved capability to return to work duties. Baseline: To be assessed. Goal status: INITIAL  ASSESSMENT:  CLINICAL IMPRESSION: Emphasis of skilled PT session today on goal assessment with patient showing modest improvement.  He is compliant to an introductory HEP targeting his knee control, but would benefit from advancement.  He ambulates roughly a consistent distance of 964 feet  during w/o AD today, but requires CGA for 3 instances of right toe catch causing stumble.  His FGA score did show improvement to 23/30 vs prior 20/30.  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  He remains somewhat impulsive which PT continues to address.  Will re-cert at this time.  OBJECTIVE IMPAIRMENTS: Abnormal gait, decreased activity tolerance, decreased balance, decreased coordination, decreased endurance, decreased knowledge of use of DME, difficulty walking, decreased strength, and impaired sensation.   ACTIVITY LIMITATIONS: carrying, lifting, squatting, and locomotion level  PARTICIPATION LIMITATIONS: driving, community activity, and occupation  PERSONAL FACTORS: Fitness, Past/current experiences, Profession, Sex, Transportation, and 1-2 comorbidities: Left hip AVN and right oligodendroglioma  are also affecting patient's functional outcome.   REHAB POTENTIAL: Excellent  CLINICAL DECISION MAKING: Evolving/moderate complexity  EVALUATION COMPLEXITY:  Moderate  PLAN:  PT FREQUENCY: 1x/week + 1x/week  PT DURATION: 8 weeks + 8 weeks  PLANNED INTERVENTIONS: Therapeutic exercises, Therapeutic activity, Neuromuscular re-education, Balance training, Gait training, Patient/Family education, Self Care, Stair training, Vestibular training, DME instructions, Manual therapy, and Re-evaluation  PLAN FOR NEXT SESSION: did pt see what equipment is available at fire station? If so could quickly review what equipment he would be safe to use (already recommended treadmill and leg press); Turnout gear and hummer transfer, shoulder and farmer carry, balance bar (pipe w/ wts), tandem, trampoline, body blade, did he bring work boots to practice walking in?  Camille Bal, PT, DPT  02/09/2023, 3:32 PM

## 2023-02-16 ENCOUNTER — Ambulatory Visit: Payer: BC Managed Care – PPO | Admitting: Physical Therapy

## 2023-02-16 DIAGNOSIS — M6281 Muscle weakness (generalized): Secondary | ICD-10-CM

## 2023-02-16 DIAGNOSIS — R29898 Other symptoms and signs involving the musculoskeletal system: Secondary | ICD-10-CM

## 2023-02-16 DIAGNOSIS — I69354 Hemiplegia and hemiparesis following cerebral infarction affecting left non-dominant side: Secondary | ICD-10-CM

## 2023-02-16 DIAGNOSIS — R278 Other lack of coordination: Secondary | ICD-10-CM

## 2023-02-16 DIAGNOSIS — R2689 Other abnormalities of gait and mobility: Secondary | ICD-10-CM

## 2023-02-16 NOTE — Therapy (Signed)
OUTPATIENT PHYSICAL THERAPY NEURO TREATMENT - 10th VISIT PROGRESS NOTE   Patient Name: Corey Chang MRN: [id number] DOB:01-02-1981, 41 y.o., male Today's Date: 02/16/2023  PCP: Eden Emms, MD REFERRING PROVIDER: Charlton Amor, PA-C  END OF SESSION:  PT End of Session - 02/16/23 1402     Visit Number 10    Number of Visits 17   9 + 8 at re-cert   Date for PT Re-Evaluation 04/22/23   pushed out to accomodate scheduling w/ OT   Authorization Type BLUE CROSS BLUE [employer]IELD    PT Start Time 1400    PT Stop Time 1442    PT Time Calculation (min) 42 min    Equipment Utilized During Treatment Gait belt   Left knee cage   Activity Tolerance Patient tolerated treatment well    Behavior During Therapy WFL for tasks assessed/performed;Impulsive                Physical Therapy Progress Note   Dates of Reporting Period: 12/10/22 - 02/16/23  See Note below for Objective Data and Assessment of Progress/Goals.  Thank you for the referral of this patient. 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  He had external beam radiation in August 2001 with 54 Gy in 30 fractions on RTOG 9802 protocol.   He declined chemotherapy.    Seizure (HCC)    from history of oligodendroglioma   Past Surgical History:  Procedure Laterality Date   ANKLE SURGERY     right, x 3   brain biopsy  1997, 2001   Patient Active Problem List   Diagnosis Date Noted   Factor V Leiden mutation (HCC) 01/24/2023   Homocysteinemia 01/24/2023   Acute stroke of medulla oblongata (HCC) 11/26/2022   Hyponatremia 11/24/2022   Acute CVA (cerebrovascular accident) (HCC) 11/23/2022   Polycythemia 11/23/2022   Elevated LFTs 04/12/2022   Mixed hyperlipidemia 04/12/2022   Acute cough 04/12/2022   Syncope 04/03/2022    Elevated troponin 04/03/2022   Avascular necrosis of bone of hip, left (HCC) 04/03/2022   Allergic rhinitis 04/03/2022   Eustachian tube dysfunction, left 10/06/2021   Bloody diarrhea 11/17/2017   History of oligodendroglioma of brain 03/22/2017   Status post radiation therapy 03/22/2017   GERD (gastroesophageal reflux disease) 11/11/2016   Complex partial seizure (HCC) 12/21/2012   Oligodendroglioma (HCC) 10/14/2011   Seasonal allergies 07/08/2008   SMOKELESS TOBACCO ABUSE 08/02/2007   Intracranial tumor (HCC) 11/17/1999   Seizure disorder (HCC) 01/16/1997    ONSET DATE: 11/26/2022 (CVA)  REFERRING DIAG: I63.9 (ICD-10-CM) - Cerebral infarction, unspecified  THERAPY DIAG:  Other lack of coordination  Hemiplegia and hemiparesis following cerebral infarction affecting left non-dominant side (HCC)  Muscle weakness (generalized)  Other abnormalities of gait and mobility  Other symptoms and signs involving the musculoskeletal system  Rationale for Evaluation and Treatment: Rehabilitation  SUBJECTIVE:  SUBJECTIVE STATEMENT: Pt went to a funeral on Friday and had to walk a long distance and uphill and was very fatigued, could tell on Saturday that he overdid it. Pt reports he still can drop things with his L hand if not paying attention to it, tried to carry in 6 grocery bags in each hand and dropped one and wasn't aware of it. Pt reports he does still need the handrail when going up/down stairs and has more difficulty going down the stairs vs going up. Pt denies any falls.  Pt accompanied by: significant other-wife, [name]; (per pt he had a seizure back in April so cannot be cleared to drive for 6 months from that date)  PERTINENT HISTORY: L hip AVN (ortho not planning to address unless significant  limitations in function-followed by [facility]), ankle ORIF x3, oligodendroglioma s/p Postoperative radiotherapy (XRT), complex partial seizures  Right hand dominant male who was admitted to [facility] on 11/23/2022 with 1 day history of left-sided numbness with left lower extremity weakness followed by sudden onset of dizziness and diarrhea.  He was found to have acute infarct in medulla.  PAIN:  Are you having pain? 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 2. Unchanged nonenhancing right temporal lobe mass, previously reported to represent an oligodendroglioma.  COGNITION: Overall cognitive status: Within functional limits for tasks assessed   SENSATION: Light touch: Impaired ; left side feels more dull compared to right  COORDINATION: LE RAMS:  dysmetric Heel-to-shin:  limited by L Hip AVN  EDEMA:  None noted in BLE  MUSCLE TONE: None noted during functional assessment  POSTURE: No Significant postural limitations  LOWER EXTREMITY ROM:     Active  Right Eval Left Eval  Hip flexion  Grossly WFL  Hip extension   Hip abduction   Hip adduction   Hip internal rotation   Hip external rotation   Knee flexion   Knee extension   Ankle dorsiflexion   Ankle plantarflexion    Ankle inversion    Ankle eversion     (Blank rows = not tested)  LOWER EXTREMITY MMT:    MMT Right Eval Left Eval  Hip flexion 5/5 4+/5  Hip extension    Hip abduction " "  Hip adduction " "  Hip internal rotation    Hip external rotation    Knee flexion " "  Knee extension " "  Ankle dorsiflexion " "  Ankle plantarflexion    Ankle inversion    Ankle eversion    (Blank rows = not tested)  BED MOBILITY:  Sit to supine  Complete Independence Supine to sit Complete Independence Rolling to Right Complete Independence Rolling to Left Complete Independence  TRANSFERS: Assistive device utilized: None  Sit to stand: Complete Independence Stand to sit: Complete Independence Chair to chair: Complete Independence  GAIT: Gait pattern: decreased hip/knee flexion- Left and genu recurvatum- Left Distance walked: various clinic distances Assistive device utilized: None Level of assistance: SBA Comments: Pt has one instance of left LE buckling stating his hip was catching and hurting.  FUNCTIONAL TESTS:  5 times sit to stand: 6.72 seconds no UE support, intermittent touch of back of legs to chair, no LOB 6 minute walk test: To be assessed. Functional gait assessment: To be assessed.  PATIENT SURVEYS:  FOTO not captured at intake.  TODAY'S TREATMENT:                                                                                                                               TherAct Reassessed: FOTO (LE): 63 FOTO (UE): 64  TherEx To work on TEFL teacher and dynamic standing balance: Gait while carrying 15# surge x 115 ft Added in 5# ankle weights x 115 ft Decreased LLE clearance with onset of ankle weights With surge in "bazooka" mode x 115 ft on R shoulder, x 115 ft on L  shoulder with B 5# ankle weights Gait with 14# weighted bar over shoulders x 115 ft and 5# ankle weights Gait with 8# weighted bar performing OH presses x 115 ft  In half-kneel on floor performing for isolation of hip musculature for strengthening: Ball toss 2 x 30 reps L/R Increased instances of LOB when kneeling on L knee, needs up to min A to prevent a fall  Standing balance balloon volleyball toss with 8# weighted dowel x 30 reps   PATIENT EDUCATION: Education details: again encouraged pt to bring in his boots and/or turnout gear for simulation of putting on turnout gear and getting into lifted hummer 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   Person educated: Patient Education method: Explanation Education comprehension: verbalized understanding  HOME EXERCISE PROGRAM: Access Code: [email or web address] URL: [email or web address] Date: 12/15/2022 Prepared by: Peter Congo  Exercises - Staggered Sit-to-Stand  - 1 x daily - 7 x weekly - 3 sets - 10 reps - Standing Terminal Knee Extension with Resistance  - 1 x daily - 7 x weekly - 3 sets - 10 reps - Staggered Stance Squat  - 1 x daily - 7 x weekly - 3 sets - 10 reps - Forward Step Down with Heel Tap and Counter Support  - 1 x daily - 7 x weekly - 3 sets - 10 reps  GOALS:  NEW GOALS:  Goals reviewed with patient? Yes  SHORT TERM GOALS:   Target date: 03/11/2023  Pt will ambulate >/=1200 feet on to demonstrate improved functional endurance for home and community participation. Baseline: 964 ft SBA-CGA (7/24) Goal status: INITIAL  LONG TERM GOALS:  Target date: 04/08/2023  Pt will be independent and compliant with advanced strength and balance HEP in order to maintain functional progress and improve mobility for return to work as able. Baseline: To be advanced. Goal status: INITIAL  2.  Pt will improve FGA score to >/=26/30 in order to demonstrate improved balance and decreased fall  risk. Baseline: 23/30 (7/24) Goal status: INITIAL  3.  Pt will ambulate >/=500 feet with LRAD over unlevel indoor and outdoor surfaces demonstrating improved left knee control at no more than modI level of assist to promote household and community access. Baseline: level surfaces 964 ft no AD SBA-CGA 3 instances of right toe catch (7/24) Goal status: INITIAL  4.  Patient will carry >/=20 lbs for at least 250 feet in order to demonstrate improved capability to return to work duties. Baseline: To be assessed. Goal status: INITIAL  ASSESSMENT:  CLINICAL IMPRESSION: Emphasis of skilled PT session on working on higher level dynamic balance and strengthening tasks for return to work and Market researcher. Pt exhibits the most challenge with added ankle weights during gait and with half-kneeling on LLE. Pt does continue to exhibit decreased LLE strength and decreased attention to LLE during functional activities. Pt continues to benefit from skilled therapy services to work towards LTGs and return to PLOF. Continue POC.   OBJECTIVE IMPAIRMENTS: Abnormal gait, decreased activity tolerance, decreased balance, decreased coordination, decreased endurance, decreased knowledge of use of DME, difficulty walking, decreased strength, and impaired sensation.   ACTIVITY LIMITATIONS: carrying, lifting, squatting, and locomotion level  PARTICIPATION LIMITATIONS: driving, community activity, and occupation  PERSONAL FACTORS: Fitness, Past/current experiences, Profession, Sex, Transportation, and 1-2 comorbidities: Left hip AVN and right oligodendroglioma  are also affecting patient's functional outcome.   REHAB POTENTIAL: Excellent  CLINICAL DECISION MAKING: Evolving/moderate complexity  EVALUATION COMPLEXITY: Moderate  PLAN:  PT FREQUENCY: 1x/week + 1x/week  PT DURATION: 8 weeks + 8 weeks  PLANNED INTERVENTIONS: Therapeutic exercises, Therapeutic activity, Neuromuscular re-education, Balance  training, Gait training, Patient/Family education, Self Care, Stair training, Vestibular training, DME instructions, Manual therapy, and Re-evaluation  PLAN FOR NEXT SESSION: did pt see what equipment is available at fire station? If so could quickly review what equipment he would be safe to use (already recommended treadmill and leg press); Turnout gear and hummer transfer, shoulder and farmer carry, balance bar (pipe w/ wts), tandem, trampoline, body blade, did he bring work boots to practice walking in?, surge + lunges, body blade, lateral bounding, stance jacks vs jumping jacks, rebounder  Peter Congo, PT, DPT, CSRS   02/16/2023, 2:43 PM

## 2023-02-17 ENCOUNTER — Encounter: Payer: Self-pay | Admitting: Physical Medicine & Rehabilitation

## 2023-02-17 ENCOUNTER — Encounter
Payer: BC Managed Care – PPO | Attending: Physical Medicine & Rehabilitation | Admitting: Physical Medicine & Rehabilitation

## 2023-02-17 VITALS — BP 127/90 | HR 77 | Ht 71.0 in | Wt 183.0 lb

## 2023-02-17 DIAGNOSIS — I69393 Ataxia following cerebral infarction: Secondary | ICD-10-CM | POA: Insufficient documentation

## 2023-02-17 DIAGNOSIS — I639 Cerebral infarction, unspecified: Secondary | ICD-10-CM | POA: Diagnosis present

## 2023-02-17 NOTE — Progress Notes (Signed)
Subjective:    Patient ID: Corey Chang, male    DOB: [birth year], 42 y.o.   MRN: [id number] 42 y.o. right-handed male with history of oligodendroglioma s/p XRT, complex partial seizures, AVN left hip who was admitted to [facility] on 11/23/2022 with 1 day history of left-sided numbness with left lower extremity weakness followed by sudden onset of dizziness and diarrhea.  He was found to have acute infarct in [place] as well as unchanged nonenhancing right temporal lobe mass.  Dr. Selina Cooley felt the stroke was due to small vessel disease and recommended DAPT x 3 weeks followed by aspirin alone.  She also recommended cardiac monitor as well as hypercoagulable panel due to young age and results are pending.  JAK2 analysis and erythropoietin levels were ordered for workup of polycythemia.  He continued to be limited by left-sided sensory deficits, left knee instability as well as decrease in fine motor movement of RUE.    Admit date: 11/26/2022 Discharge date: 12/03/2022 HPI Overall the patient feels like he is doing relatively well he is independent with all self-care and mobility.  He still is having some difficulty with household tasks that require the use of the left upper extremity.  He does not do yard work due to severe allergies Seen by Heme/Onc factor 5 Leiden heterozygous + hyper homocysteinemia Now on DAPT as well as low dose Xarelto Last seizure 10/31/22  The patient had been a Naval architect we discussed that given his balance deficits as well as multiple anticoagulants that this is not advised. In addition we discussed his work activities he does have to use both upper extremities he does unload heavy rolls of metal that may weigh more than 100 pounds.  He also needs to use a ladder at times.  He is also using his left upper extremity stabilize objects while he cuts them.  He does have sensory deficits with residual in his left upper extremity.  He already has cut himself even before his  stroke.  Per PT note 02/16/23 "Pt went to a funeral on Friday and had to walk a long distance and uphill and was very fatigued, could tell on Saturday that he overdid it. Pt reports he still can drop things with his L hand if not paying attention to it, tried to carry in 6 grocery bags in each hand and dropped one and wasn't aware of it. Pt reports he does still need the handrail when going up/down stairs and has more difficulty going down the stairs vs going up. Pt denies any falls. " Pain Inventory Average Pain 4 Pain Right Now 5 My pain is constant, dull, and tingling  In the last 24 hours, has pain interfered with the following? General activity 4 Relation with others 4 Enjoyment of life 4 What TIME of day is your pain at its worst? morning  Sleep (in general) Fair  Pain is worse with: walking and standing Pain improves with: rest and heat/ice Relief from Meds:  no relief  Family History  Problem Relation Age of Onset   Alzheimer's disease Mother    Diabetes Father    Heart disease Maternal Grandfather        MI   Parkinsonism Paternal Grandmother    Alzheimer's disease Paternal Grandmother    Depression Neg Hx    Drug abuse Neg Hx    Alcohol abuse Neg Hx    Cancer Neg Hx        no colon, prostate, breast, uterine,ovarian  Social History   Socioeconomic History   Marital status: Married    Spouse name: Corey Chang   Number of children: 2   Years of education: college   Highest education level: Associate degree: occupational, Scientist, product/process development, or vocational program  Occupational History   Occupation: sign erector    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[employer]; 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[place], [place]    Employer: [employer]  Tobacco Use   Smoking status: Former    Current packs/day: 0.00    Average packs/day: 0.5 packs/day for 15.0 years (7.5 ttl pk-yrs)    Types: Cigarettes    Start date: 07/20/1991    Quit date: 07/19/2006    Years since quitting: 16.5   Smokeless tobacco: Current    Types: Snuff    Tobacco comments:    Quit 2006  Dipping everyday  Vaping Use   Vaping status: Never Used  Substance and Sexual Activity   Alcohol use: Yes    Alcohol/week: 2.0 standard drinks of alcohol    Types: 2 Cans of beer per week    Comment: 2-3 beer daily   Drug use: No   Sexual activity: Yes    Birth control/protection: Pill  Other Topics Concern   Not on file  Social History Narrative   10/06/21   From: the area   Living: with wife, Aundra Millet (2008) and children   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[employer]   Three story   Family: 2 children - Mollie (2011) and Armed forces logistics/support/administrative officer (2014)      Enjoys: shoot - hunting and shooting      Exercise: trying to get kids to do soccer and play with children in sports   Diet: whatever he wants      Safety   Seat belts: Yes  and occasionally does not   Guns: Yes and secure   Safe in relationships: Yes       Social Determinants of Health   Financial Resource Strain: Low Risk  (12/07/2022)   Overall Financial Resource Strain (CARDIA)    Difficulty of Paying Living Expenses: Not hard at all  Food Insecurity: No Food Insecurity (12/07/2022)   Hunger Vital Sign    Worried About Running Out of Food in the Last Year: Never true    Ran Out of Food in the Last Year: Never true  Transportation Needs: No Transportation Needs (12/07/2022)   PRAPARE - Administrator, Civil Service (Medical): No    Lack of Transportation (Non-Medical): No  Physical Activity: Sufficiently Active (12/07/2022)   Exercise Vital Sign    Days of Exercise per Week: 5 days    Minutes of Exercise per Session: 50 min  Stress: No Stress Concern Present (12/07/2022)   [employer] of Occupational Health - Occupational Stress Questionnaire    Feeling of Stress : Not at all  Social Connections: Socially Integrated (12/07/2022)   Social Connection and Isolation Panel [NHANES]    Frequency of Communication with Friends and Family: More than three times a week    Frequency of Social Gatherings  with Friends and Family: Once a week    Attends Religious Services: More than 4 times per year    Active Member of [employer] or Organizations: Yes    Attends Engineer, structural: More than 4 times per year    Marital Status: Married   Past Surgical History:  Procedure Laterality Date   ANKLE SURGERY     right, x 3   brain biopsy  1997, 2001  Past Surgical History:  Procedure Laterality Date   ANKLE SURGERY     right, x 3   brain biopsy  1997, 2001   Past Medical History:  Diagnosis Date   Allergic rhinitis 04/22/1997   Ankle fracture, right 07/2007   Brain cancer (HCC)    Cholelithiasis    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  He had external beam radiation in August 2001 with 54 Gy in 30 fractions on RTOG 9802 protocol.   He declined chemotherapy.    Seizure (HCC)    from history of oligodendroglioma   BP (!) 127/90   Pulse 77   Ht 5\' 11"  (1.803 m)   Wt 183 lb (83 kg)   SpO2 99%   BMI 25.52 kg/m   Opioid Risk Score:   Fall Risk Score:  `1  Depression screen [facility] 2/9     02/17/2023   10:36 AM 12/23/2022    2:15 PM 12/23/2022    1:00 PM 11/11/2016    9:37 AM  Depression screen PHQ 2/9  Decreased Interest 0 0 0 0  Down, Depressed, Hopeless 0 0 0 0  PHQ - 2 Score 0 0 0 0  Altered sleeping  0    Tired, decreased energy  0    Change in appetite  0    Feeling bad or failure about yourself   0    Trouble concentrating  0    Moving slowly or fidgety/restless  0    Suicidal thoughts  0    PHQ-9 Score  0      Review of Systems  Musculoskeletal:  Positive for back pain and neck pain.       LT shoulder arm hip leg  side pain  All other systems reviewed and are negative.      Objective:   Physical Exam General no acute distress  Affect appropriate Extremities without edema MSK Limited range of motion in the left hip as well as antalgic gait with left hip. Motor strength 4/5 in the left deltoid by stress grip hip flexor knee extensor  ankle dorsiflexor 5/5 on the right side Sensation reduced pinprick on the left upper and left lower limb compared to the right side.  No asymmetry with face There is mild facial weakness on the left side There is moderate dysmetria left finger-nose to finger. Patient can ambulate without assistive device he has a widened base support no evidence of toe drag or knee instability he is able to toe walk       Assessment & Plan:    Medullary infarct, Left HP as well as left hemisensory deficits that suggest additional fiber tract involvement.  He is unable to return to work at this time continue outpatient PT OT I would expect that he should plateau in the next 2 to 3 months I will see him back in 2 months to further evaluate.  Have written additional time off for short-term disability may need to convert to long-term if he does not have any additional improvement in his stroke related deficits 2.  AVN chronic has chronic neck limitations and intermittent chronic pain in the left hip  3.  Hypercoagulable state currently on Xarelto, clopidogrel as well as aspirin.  As discussed with patient high risk for bleeding we discussed that he should monitor his urine as well as his stools for traces of blood.  In addition avoid activities that may cause falls 4.  Seizure d/o last sz 10/31/22,  follow-up with neurology no driving by state law until cleared by neurology.  This would be at least 6 months post last seizure

## 2023-02-22 ENCOUNTER — Ambulatory Visit: Payer: BC Managed Care – PPO | Admitting: Nurse Practitioner

## 2023-02-22 ENCOUNTER — Other Ambulatory Visit (HOSPITAL_COMMUNITY): Payer: Self-pay

## 2023-02-22 ENCOUNTER — Encounter: Payer: Self-pay | Admitting: Nurse Practitioner

## 2023-02-22 VITALS — BP 110/78 | HR 81 | Temp 97.6°F | Ht 71.0 in | Wt 184.6 lb

## 2023-02-22 DIAGNOSIS — G40909 Epilepsy, unspecified, not intractable, without status epilepticus: Secondary | ICD-10-CM | POA: Diagnosis not present

## 2023-02-22 DIAGNOSIS — D6851 Activated protein C resistance: Secondary | ICD-10-CM

## 2023-02-22 DIAGNOSIS — Z8673 Personal history of transient ischemic attack (TIA), and cerebral infarction without residual deficits: Secondary | ICD-10-CM

## 2023-02-22 DIAGNOSIS — N529 Male erectile dysfunction, unspecified: Secondary | ICD-10-CM

## 2023-02-22 DIAGNOSIS — E785 Hyperlipidemia, unspecified: Secondary | ICD-10-CM | POA: Diagnosis not present

## 2023-02-22 DIAGNOSIS — I639 Cerebral infarction, unspecified: Secondary | ICD-10-CM

## 2023-02-22 DIAGNOSIS — K219 Gastro-esophageal reflux disease without esophagitis: Secondary | ICD-10-CM

## 2023-02-22 LAB — COMPREHENSIVE METABOLIC PANEL
ALT: 38 U/L (ref 0–53)
AST: 36 U/L (ref 0–37)
Albumin: 4.9 g/dL (ref 3.5–5.2)
Alkaline Phosphatase: 114 U/L (ref 39–117)
BUN: 5 mg/dL — ABNORMAL LOW (ref 6–23)
CO2: 25 mEq/L (ref 19–32)
Calcium: 10.5 mg/dL (ref 8.4–10.5)
Chloride: 103 mEq/L (ref 96–112)
Creatinine, Ser: 0.92 mg/dL (ref 0.40–1.50)
GFR: 103.44 mL/min (ref >60.00)
Glucose, Bld: 90 mg/dL (ref 70–99)
Potassium: 3.9 mEq/L (ref 3.5–5.1)
Sodium: 140 mEq/L (ref 135–145)
Total Bilirubin: 0.5 mg/dL (ref 0.2–1.2)
Total Protein: 7.3 g/dL (ref 6.0–8.3)

## 2023-02-22 LAB — LIPID PANEL
Cholesterol: 120 mg/dL (ref 0–200)
HDL: 33.7 mg/dL — ABNORMAL LOW (ref >39.00)
NonHDL: 86.62
Total CHOL/HDL Ratio: 4
Triglycerides: 292 mg/dL — ABNORMAL HIGH (ref 0.0–149.0)
VLDL: 58.4 mg/dL — ABNORMAL HIGH (ref 0.0–40.0)

## 2023-02-22 LAB — CBC
HCT: 43.6 % (ref 39.0–52.0)
Hemoglobin: 14.7 g/dL (ref 13.0–17.0)
MCHC: 33.7 g/dL (ref 30.0–36.0)
MCV: 88 fl (ref 78.0–100.0)
Platelets: 242 10*3/uL (ref 150.0–400.0)
RBC: 4.95 Mil/uL (ref 4.22–5.81)
RDW: 14.8 % (ref 11.5–15.5)
WBC: 5.7 10*3/uL (ref 4.0–10.5)

## 2023-02-22 LAB — LDL CHOLESTEROL, DIRECT: Direct LDL: 63 mg/dL

## 2023-02-22 MED ORDER — SILDENAFIL CITRATE 50 MG PO TABS
25.0000 mg | ORAL_TABLET | Freq: Every day | ORAL | 0 refills | Status: DC | PRN
Start: 2023-02-22 — End: 2023-05-18

## 2023-02-22 NOTE — Assessment & Plan Note (Signed)
Patient is seen by hematology and started on Xarelto.  Continue taking medication as prescribed follow-up with hematology as recommended

## 2023-02-22 NOTE — Assessment & Plan Note (Signed)
Patient on lamotrigine and Lamictal.  Followed by neurology.  Patient does not drive due to the seizures

## 2023-02-22 NOTE — Assessment & Plan Note (Signed)
Secondary stroke.  Will have patient start with 25 to 50 mg sildenafil as needed.

## 2023-02-22 NOTE — Progress Notes (Signed)
Established Patient Office Visit  Subjective   Patient ID: Corey Chang, male    DOB: [birth year]  Age: 42 y.o. MRN: [id number]  Chief Complaint  Patient presents with   Transitions Of Care    Pt has no questions or concerns.     HPI   Transfer of care: last seen by me on 12/07/2022 for acute stroke hospital follow up  Last [name], MD on 04/12/2022   HLD: Tolerating the cholesterol medication well  CVA: states that he has done OT and PT. States that he is going to continue the PT. He is working on some training. Left side is less sensative. States that he was less sensative to the sharp sensation    Seizures and CVA: is followed by [name], MD neurology.  Sees patient every 6 months to once a year depending on stability with seizures.  Patient currently not driving.  Patient recently had MRI in the hospital that showed stability with his mass in the brain   Factor 5 lidein: Patient had a hypercoagulability workup done in the hospital and setting of an acute stroke with his age.  Came back positive for factor V Leiden was evaluated by hematology and placed on Xarelto  for complete physical and follow up of chronic conditions.  Immunizations: -Tetanus: Completed in 2024 -Influenza: refused -Shingles: Too young -Pneumonia: Too young - covid: refused   Diet: Fair diet. States 3 meals a day. States breakfast is smaller. States that he does home cooked and eating out. Water and will do green peak sweet teas sometimes once a day  Exercise:  Patient currently going through physical therapy exercises.  He does these at home also  Eye exam: PRN Dental exam: Completes semi-annually    Colonoscopy: too young, currently average risk Lung Cancer Screening: N/A  PSA: Too young, currently average risk  Sleep: states that it depends and will get 8 hours of sleep. Feels rested.   ED: states that since the storke he is having trouble getting a full erection.   States sometimes he gets enough erection to have penetrative sex other times he does not.  He is curious if it is related to the stroke or medications.  He does have left-sided sensation abnormalities and states when they went to have sex he felt like he was falling off of the bed that he was in the center of the bed.     Review of Systems  Constitutional:  Negative for chills and fever.  Respiratory:  Negative for shortness of breath.   Cardiovascular:  Negative for chest pain and leg swelling.  Gastrointestinal:  Negative for abdominal pain, blood in stool, constipation, diarrhea, nausea and vomiting.       BM daily   Genitourinary:  Negative for dysuria and hematuria.  Neurological:  Negative for tingling and headaches.  Psychiatric/Behavioral:  Negative for hallucinations and suicidal ideas.       Objective:     BP 110/78   Pulse 81   Temp 97.6 F (36.4 C) (Temporal)   Ht 5\' 11"  (1.803 m)   Wt 184 lb 9.6 oz (83.7 kg)   SpO2 98%   BMI 25.75 kg/m    Physical Exam Vitals and nursing note reviewed.  Constitutional:      Appearance: Normal appearance.  HENT:     Right Ear: Tympanic membrane, ear canal and external ear normal.     Left Ear: Tympanic membrane, ear canal and external ear normal.  Mouth/Throat:     Mouth: Mucous membranes are moist.     Pharynx: Oropharynx is clear.  Eyes:     Extraocular Movements: Extraocular movements intact.     Pupils: Pupils are equal, round, and reactive to light.  Cardiovascular:     Rate and Rhythm: Normal rate and regular rhythm.     Pulses: Normal pulses.     Heart sounds: Normal heart sounds.  Pulmonary:     Effort: Pulmonary effort is normal.     Breath sounds: Normal breath sounds.  Musculoskeletal:     Right lower leg: No edema.     Left lower leg: No edema.  Lymphadenopathy:     Cervical: No cervical adenopathy.  Skin:    General: Skin is warm.  Neurological:     General: No focal deficit present.     Mental  Status: He is alert.     Deep Tendon Reflexes:     Reflex Scores:      Bicep reflexes are 2+ on the right side and 3+ on the left side.      Patellar reflexes are 2+ on the right side and 3+ on the left side.    Comments: Bilateral upper and lower extremity strength 5/5  Psychiatric:        Mood and Affect: Mood normal.        Behavior: Behavior normal.        Thought Content: Thought content normal.        Judgment: Judgment normal.      No results found for any visits on 02/22/23.    The ASCVD Risk score (Arnett DK, et al., 2019) failed to calculate for the following reasons:   The patient has a prior MI or stroke diagnosis    Assessment & Plan:   Problem List Items Addressed This Visit       Cardiovascular and Mediastinum   Acute CVA (cerebrovascular accident) [facility]) - Primary    Patient is followed by neurology and hematology.  He did come back positive for factor V Leiden.  Patient is on high intensity statin also      Relevant Medications   sildenafil (VIAGRA) 50 MG tablet   Other Relevant Orders   Lipid panel     Digestive   GERD (gastroesophageal reflux disease)    Patient currently maintained on Protonix.  Continue        Nervous and Auditory   Seizure disorder [facility])    Patient on lamotrigine and Lamictal.  Followed by neurology.  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  Continue taking medication as  prescribed follow-up with hematology as recommended      Relevant Orders   CBC   Comprehensive metabolic panel     Other   Hyperlipidemia    History of the same.  Patient currently on a high intensity statin  Relevant Medications   sildenafil (VIAGRA) 50 MG tablet   Other Relevant Orders   Lipid panel   Erectile dysfunction    Secondary stroke.  Will have patient start with 25 to 50 mg sildenafil as needed.      Relevant Medications   sildenafil (VIAGRA) 50 MG tablet    Return in about 6 months (around 08/25/2023) for CPE and Labs.    Audria Nine, NP

## 2023-02-22 NOTE — Assessment & Plan Note (Signed)
History of the same.  Patient currently on a high intensity statin

## 2023-02-22 NOTE — Assessment & Plan Note (Signed)
Patient is followed by neurology and hematology.  He did come back positive for factor V Leiden.  Patient is on high intensity statin also

## 2023-02-22 NOTE — Patient Instructions (Addendum)
Nice to see you today I will be in touch with the labs once I have reviewed them Follow up with me in 6 months for your physical, sooner if you need me

## 2023-02-22 NOTE — Assessment & Plan Note (Signed)
Patient currently maintained on Protonix.  Continue

## 2023-02-22 NOTE — Assessment & Plan Note (Signed)
Thank you evaluated in the hospital hypercoagulable workup done patient positive for factor V Leiden patient is completed OT has completed physical therapy with extended and still participating and doing at home exercises patient has left-sided sensation differences with hyperreflexia in the tendons

## 2023-03-02 ENCOUNTER — Ambulatory Visit: Payer: BC Managed Care – PPO | Attending: Physician Assistant | Admitting: Physical Therapy

## 2023-03-02 ENCOUNTER — Encounter: Payer: Self-pay | Admitting: Physical Therapy

## 2023-03-02 DIAGNOSIS — R29818 Other symptoms and signs involving the nervous system: Secondary | ICD-10-CM | POA: Insufficient documentation

## 2023-03-02 DIAGNOSIS — R2689 Other abnormalities of gait and mobility: Secondary | ICD-10-CM | POA: Diagnosis present

## 2023-03-02 DIAGNOSIS — R278 Other lack of coordination: Secondary | ICD-10-CM | POA: Insufficient documentation

## 2023-03-02 DIAGNOSIS — I69354 Hemiplegia and hemiparesis following cerebral infarction affecting left non-dominant side: Secondary | ICD-10-CM | POA: Insufficient documentation

## 2023-03-02 DIAGNOSIS — R29898 Other symptoms and signs involving the musculoskeletal system: Secondary | ICD-10-CM | POA: Diagnosis present

## 2023-03-02 DIAGNOSIS — M6281 Muscle weakness (generalized): Secondary | ICD-10-CM | POA: Diagnosis present

## 2023-03-02 DIAGNOSIS — R208 Other disturbances of skin sensation: Secondary | ICD-10-CM | POA: Diagnosis present

## 2023-03-02 NOTE — Therapy (Signed)
OUTPATIENT PHYSICAL THERAPY NEURO TREATMENT   Patient Name: Corey Chang MRN: [id number] DOB:Mar 11, 1981, 42 y.o., male Today's Date: 03/02/2023  PCP: Eden Emms, MD REFERRING PROVIDER: Charlton Amor, PA-C  END OF SESSION:  PT End of Session - 03/02/23 1109     Visit Number 11    Number of Visits 17   9 + 8 at re-cert   Date for PT Re-Evaluation 04/22/23   pushed out to accomodate scheduling w/ OT   Authorization Type BLUE CROSS BLUE [employer]IELD    PT Start Time 1106    PT Stop Time 1153    PT Time Calculation (min) 47 min    Equipment Utilized During Treatment Gait belt   Left knee cage   Activity Tolerance Patient tolerated treatment well    Behavior During Therapy WFL for tasks assessed/performed;Impulsive            Past Medical History:  Diagnosis Date   Allergic rhinitis 04/22/1997   Ankle fracture, right 07/2007   Brain cancer (HCC)    Cholelithiasis    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  He had external beam radiation in August 2001 with 54 Gy in 30 fractions on RTOG 9802 protocol.   He declined chemotherapy.    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   Hyponatremia 11/24/2022   Acute CVA (cerebrovascular accident) (HCC) 11/23/2022   Polycythemia 11/23/2022   Elevated LFTs 04/12/2022   Hyperlipidemia 04/12/2022   Acute cough 04/12/2022   Syncope 04/03/2022   Elevated troponin 04/03/2022   Avascular necrosis of bone of hip, left (HCC) 04/03/2022   Allergic rhinitis 04/03/2022   Eustachian tube dysfunction, left 10/06/2021   Bloody diarrhea  11/17/2017   History of oligodendroglioma of brain 03/22/2017   Status post radiation therapy 03/22/2017   GERD (gastroesophageal reflux disease) 11/11/2016   Complex partial seizure (HCC) 12/21/2012   Oligodendroglioma (HCC) 10/14/2011   Seasonal allergies 07/08/2008   SMOKELESS TOBACCO ABUSE 08/02/2007   Intracranial tumor (HCC) 11/17/1999   Seizure disorder (HCC) 01/16/1997    ONSET DATE: 11/26/2022 (CVA)  REFERRING DIAG: I63.9 (ICD-10-CM) - Cerebral infarction, unspecified  THERAPY DIAG:  Other lack of coordination  Hemiplegia and hemiparesis following cerebral infarction affecting left non-dominant side (HCC)  Muscle weakness (generalized)  Other abnormalities of gait and mobility  Rationale for Evaluation and Treatment: Rehabilitation  SUBJECTIVE:  SUBJECTIVE STATEMENT: Pt brought his turnout gear.  Pt denies any falls.  Has not had a chance to check out equipment at fire station yet.  He has been doing a lot at home and has to be careful when going in his backyard due to 15 foot grade difference from front yard.  He continues doing fine without knee cage.  Pt accompanied by: self  PERTINENT HISTORY: L hip AVN (ortho not planning to address unless significant limitations in function-followed by [facility]), ankle ORIF x3, oligodendroglioma s/p Postoperative radiotherapy (XRT), complex partial seizures  Right hand dominant male who was admitted to [facility] on 11/23/2022 with 1 day history of left-sided numbness with left lower extremity weakness followed by sudden onset of dizziness and diarrhea.  He was found to have acute infarct in medulla.  PAIN:  Are you having pain? 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 2. Unchanged nonenhancing right temporal lobe mass, previously reported to represent an oligodendroglioma.  COGNITION: Overall cognitive status: Within functional limits for tasks assessed   SENSATION: Light touch: Impaired ; left side feels more dull compared to right  COORDINATION: LE RAMS:  dysmetric Heel-to-shin:  limited by L Hip AVN  EDEMA:  None noted in BLE  MUSCLE TONE: None noted during functional assessment  POSTURE: No Significant postural limitations  LOWER EXTREMITY ROM:     Active  Right Eval Left Eval  Hip flexion Grossly WFL  Hip extension   Hip abduction   Hip adduction   Hip internal rotation   Hip external rotation   Knee flexion   Knee extension   Ankle dorsiflexion   Ankle plantarflexion    Ankle inversion    Ankle eversion     (Blank rows = not tested)  LOWER EXTREMITY MMT:    MMT Right Eval Left Eval  Hip flexion 5/5 4+/5  Hip extension    Hip abduction " "  Hip adduction " "  Hip internal rotation    Hip external rotation    Knee flexion " "  Knee extension " "  Ankle dorsiflexion " "  Ankle plantarflexion    Ankle  inversion    Ankle eversion    (Blank rows = not tested)  BED MOBILITY:  Sit to supine Complete Independence Supine to sit Complete Independence Rolling to Right Complete Independence Rolling to Left Complete Independence  TRANSFERS: Assistive device utilized: None  Sit to stand: Complete Independence Stand to sit: Complete Independence Chair to chair: Complete Independence  GAIT: Gait pattern: decreased hip/knee flexion- Left and genu recurvatum- Left Distance walked:  various clinic distances Assistive device utilized: None Level of assistance: SBA Comments: Pt has one instance of left LE buckling stating his hip was catching and hurting.  FUNCTIONAL TESTS:  5 times sit to stand: 6.72 seconds no UE support, intermittent touch of back of legs to chair, no LOB 6 minute walk test: To be assessed. Functional gait assessment: To be assessed.  PATIENT SURVEYS:  FOTO not captured at intake.  TODAY'S TREATMENT:                                                                                                                               Patient puts on boots independently in sitting prior to ambulatory tasks.  STAIRS:  Level of Assistance: SBA  Stair Negotiation Technique: Alternating Pattern  Forwards with Single Rail on Right Single Rail on Left  Number of Stairs: 16   Height of Stairs: 6 inches  Comments: Pt misses 11th step but independently recovers by traversing to floor stepping down 2 steps.  No overt LOB.  Switches lateral rail during task.  GAIT: Gait pattern: genu recurvatum- Left Distance walked: 200 ft + 1000' Assistive device utilized: None Level of assistance: Complete Independence and SBA Comments: Wearing boots only x200' SBA, some left leg decreased clearance.  He tolerates increased distance in full turnout gear without excessive change in mechanics beyond typical knee hyperextension on left.  He does well with foot clearance over level indoor  surface.  -Discussed modifications to comfort with wearing boots due to right ankle hardware and new boots:  bunion pads vs thicker socks vs wrapping ankle.  Pt states he will wear thicker socks. -Patient sets turnout gear up the way it would be at the station, timed x2 trials putting on.  He would have to walk approximately 100' from meeting room at station to turnout gear before donning.  He states they do not have a maximum time limit and he can sit while donning gear if he chooses.  -Trial 1:  1 minute 35 seconds  -Trial 2 (for speed):  1 minute 35 seconds  -Encouraged patient to try deterity/gripping tasks w/ gloves on for left hand strength and motor control.  He practices bimanual adjustment of helmet shield which he struggles with, encouraged to attempt at home again later.  -4-8" hurdles in full turnout gear SBA performed 4 rounds.  Floor Recovery: Patient educated in floor recovery this visit wearing full turnout gear.  He is able to reach floor without physical assist or object for stability.  He is able to recover from floor without object for recovery but uses thighs for walking up to standing.  Good immediate standing balance.  He is able to bear crawl multi-directionally on floor before standing.  Performed 1 time. Caregiver Training:  Caregiver not present. Level of Assist:  IND.    PATIENT EDUCATION: Education details: Can bring just boots to future visit for unlevel ground practice outside if desired or  practice this at home.  Go to fire station and get in and out of truck without gear on one time and check out equipment if not able to use fire station gym before next session. Person educated: Patient Education method: Explanation Education comprehension: verbalized understanding  HOME EXERCISE PROGRAM: Access Code: [email or web address] URL: [email or web address] Date: 12/15/2022 Prepared by: Peter Congo  Exercises - Staggered Sit-to-Stand  - 1 x daily - 7 x weekly  - 3 sets - 10 reps - Standing Terminal Knee Extension with Resistance  - 1 x daily - 7 x weekly - 3 sets - 10 reps - Staggered Stance Squat  - 1 x daily - 7 x weekly - 3 sets - 10 reps - Forward Step Down with Heel Tap and Counter Support  - 1 x daily - 7 x weekly - 3 sets - 10 reps  GOALS:  NEW GOALS:  Goals reviewed with patient? Yes  SHORT TERM GOALS:   Target date: 03/11/2023  Pt will ambulate >/=1200 feet on to demonstrate improved functional endurance for home and community participation. Baseline: 964 ft SBA-CGA (7/24) Goal status: INITIAL  LONG TERM GOALS:  Target date: 04/08/2023  Pt will be independent and compliant with advanced strength and balance HEP in order to maintain functional progress and improve mobility for return to work as able. Baseline: To be advanced. Goal status: INITIAL  2.  Pt will improve FGA score to >/=26/30 in order to demonstrate improved balance and decreased fall risk. Baseline: 23/30 (7/24) Goal status: INITIAL  3.  Pt will ambulate >/=500 feet with LRAD over unlevel indoor and outdoor surfaces demonstrating improved left knee control at no more than modI level of assist to promote household and community access. Baseline: level surfaces 964 ft no AD SBA-CGA 3 instances of right toe catch (7/24) Goal status: INITIAL  4.  Patient will carry >/=20 lbs for at least 250 feet in order to demonstrate improved capability to return to work duties. Baseline: To be assessed. Goal status: INITIAL  ASSESSMENT:  CLINICAL IMPRESSION: Patient brought turnout gear today for work in session.  He manages gear independently in a reasonable time frame and is able to ambulate long distance over level surfaces and stairs at no more than SBA.  His left LE management appears similar to when he is not in turnout gear with some ongoing left knee hyperextension.  He was able to independently perform floor recovery and crawl on floor in gear.  He continues to benefit  from skilled PT to further progress high level strength and balance due to baseline activity demands in combination with job requirements.  OBJECTIVE IMPAIRMENTS: Abnormal gait, decreased activity tolerance, decreased balance, decreased coordination, decreased endurance, decreased knowledge of use of DME, difficulty walking, decreased strength, and impaired sensation.   ACTIVITY LIMITATIONS: carrying, lifting, squatting, and locomotion level  PARTICIPATION LIMITATIONS: driving, community activity, and occupation  PERSONAL FACTORS: Fitness, Past/current experiences, Profession, Sex, Transportation, and 1-2 comorbidities: Left hip AVN and right oligodendroglioma  are also affecting patient's functional outcome.   REHAB POTENTIAL: Excellent  CLINICAL DECISION MAKING: Evolving/moderate complexity  EVALUATION COMPLEXITY: Moderate  PLAN:  PT FREQUENCY: 1x/week + 1x/week  PT DURATION: 8 weeks + 8 weeks  PLANNED INTERVENTIONS: Therapeutic exercises, Therapeutic activity, Neuromuscular re-education, Balance training, Gait training, Patient/Family education, Self Care, Stair training, Vestibular training, DME instructions, Manual therapy, and Re-evaluation  PLAN FOR NEXT SESSION: Did he go to fire station to use gym or try getting in truck?, shoulder and  farmer carry, balance bar (pipe w/ wts), tandem, trampoline, body blade, walking long unlevel distances in work boots, surge + lunges, body blade, lateral bounding, stance jacks vs jumping jacks, rebounder, ASSESS STGs!  Camille Bal, PT, DPT  03/02/2023, 11:53 AM

## 2023-03-04 ENCOUNTER — Ambulatory Visit: Payer: BC Managed Care – PPO | Admitting: Neurology

## 2023-03-09 ENCOUNTER — Ambulatory Visit: Payer: BC Managed Care – PPO | Admitting: Occupational Therapy

## 2023-03-09 ENCOUNTER — Ambulatory Visit: Payer: BC Managed Care – PPO | Admitting: Physical Therapy

## 2023-03-09 ENCOUNTER — Encounter: Payer: Self-pay | Admitting: Physical Therapy

## 2023-03-09 DIAGNOSIS — R208 Other disturbances of skin sensation: Secondary | ICD-10-CM

## 2023-03-09 DIAGNOSIS — M6281 Muscle weakness (generalized): Secondary | ICD-10-CM

## 2023-03-09 DIAGNOSIS — R2689 Other abnormalities of gait and mobility: Secondary | ICD-10-CM

## 2023-03-09 DIAGNOSIS — R278 Other lack of coordination: Secondary | ICD-10-CM

## 2023-03-09 DIAGNOSIS — R29898 Other symptoms and signs involving the musculoskeletal system: Secondary | ICD-10-CM

## 2023-03-09 DIAGNOSIS — R29818 Other symptoms and signs involving the nervous system: Secondary | ICD-10-CM

## 2023-03-09 DIAGNOSIS — I69354 Hemiplegia and hemiparesis following cerebral infarction affecting left non-dominant side: Secondary | ICD-10-CM

## 2023-03-09 NOTE — Therapy (Signed)
OUTPATIENT OCCUPATIONAL THERAPY NEURO TREATMENT & DISCHARGE NOTE  Patient Name: Corey Chang MRN: [id number] DOB:02-Sep-1980, 42 y.o., male Today's Date: 03/09/2023  PCP: Gweneth Dimitri, MD REFERRING PROVIDER: Charlton Amor, PA-C  END OF SESSION:  OT End of Session - 03/09/23 1051     Visit Number 11    Number of Visits 17    Date for OT Re-Evaluation 02/04/23    Authorization Type [employer] - Auth not Reqd    OT Start Time 1100    OT Stop Time 1145    OT Time Calculation (min) 45 min    Equipment Utilized During Treatment Testing Material    Activity Tolerance Patient tolerated treatment well    Behavior During Therapy [facility] for tasks assessed/performed             Past Medical History:  Diagnosis Date   Allergic rhinitis 04/22/1997   Ankle fracture, right 07/2007   Brain cancer (HCC)    Cholelithiasis    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  He had external beam radiation in August 2001 with 54 Gy in 30 fractions on RTOG 9802 protocol.   He declined chemotherapy.    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   Hyponatremia 11/24/2022   Acute CVA (cerebrovascular accident) (HCC) 11/23/2022   Polycythemia 11/23/2022   Elevated LFTs 04/12/2022   Hyperlipidemia 04/12/2022   Acute cough 04/12/2022   Syncope 04/03/2022   Elevated troponin 04/03/2022   Avascular necrosis of bone of hip, left (HCC) 04/03/2022   Allergic rhinitis 04/03/2022   Eustachian tube dysfunction, left 10/06/2021   Bloody diarrhea 11/17/2017   History of oligodendroglioma of brain 03/22/2017    Status post radiation therapy 03/22/2017   GERD (gastroesophageal reflux disease) 11/11/2016   Complex partial seizure (HCC) 12/21/2012   Oligodendroglioma (HCC) 10/14/2011   Seasonal allergies 07/08/2008   SMOKELESS TOBACCO ABUSE 08/02/2007   Intracranial tumor (HCC) 11/17/1999   Seizure disorder (HCC) 01/16/1997    ONSET DATE: Referral date: 12/06/2022   ONSET: 11/23/22  REFERRING DIAG: I63.9 (ICD-10-CM) - CVA (cerebral vascular accident)  THERAPY DIAG:  Other disturbances of skin sensation  Other lack of coordination  Muscle weakness (generalized)  Hemiplegia and hemiparesis following cerebral infarction affecting left non-dominant side (HCC)  Other symptoms and signs involving the nervous system  Other symptoms and signs involving the musculoskeletal system  Rationale for Evaluation and Treatment: Rehabilitation OT Discharge from Hospital states: "Patient will benefit from ongoing skilled OT services in outpatient setting to continue to advance functional skills in the area of BADL, iADL, Vocation, and Reduce care partner burden."  SUBJECTIVE STATEMENT:  Patient had MD visits 02/17/23 (neuro) and 02/22/23 (PCP).  He is still on short term disability due to being on blood thinners and type of work he does.  Follow up appts are 03/23/23 Dr. Karel Jarvis & return to neuro 04/19/23 Dr. Wynn Banker   Pt accompanied by: significant other - Self   PERTINENT HISTORY:   42 yo male with history of oligodendroglioma s/p Postoperative radiotherapy (XRT), complex partial seizures,  AVN left hip who was [facility] to [facility] on [facility]23/[facility] with 1 day history of left-sided numbness with left lower extremity weakness followed by sudden onset of dizziness and diarrhea.  He was found to have acute infarct in medulla as well as unchanged nonenhancing right temporal lobe mass.    Principal Problem:   Acute stroke of medulla oblongata (HCC) Active Problems:   Seizure disorder from history of  oligodendroglioma   GERD (gastroesophageal reflux disease)   Allergic rhinitis Hyponatremia Polycythemia   Oligodendroglioma Left temporal; bx in 12/1999.  He had external beam radiation in August 2001 with 54 Gy in 30 fractions on RTOG 9802 protocol.   He declined chemotherapy.   Ankle surgery x 3   PRECAUTIONS: Fall and Other: currently not driving, h/o seizures (managed by medication)  WEIGHT BEARING RESTRICTIONS: No  PAIN:  Are you having pain? 1/10 R foot; L hand  FALLS: Has patient fallen in last 6 months? YES  At evaluation - patient reports stumbling 1x by getting his foot hooked up in some laundry in the floor and stumbling against the wall.  Patient fell morning of 01/05/23 prior to therapy appointment.  He scraped his L arm on an open drawer due to leftover luggage on the floor and his dog getting in the way.   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  Currently off work x 6 months, and Leisure: hunting, fishing, watching kids sports   PATIENT GOALS: To be able to do more FM stuff with his left hand, for things to be easier   OBJECTIVE:   HAND DOMINANCE: Right  ADLs:  "At inpatient rehab admission, patient required min to mod assist with basic ADL tasks and min assist with mobility. He exhibited mild dysarthria and mild deficits in higher level tasks. He has had improvement in activity tolerance, balance, postural control as well as ability to compensate for deficits. He is able to complete ADL tasks at modified independent level. He was  independent for transfers and is able to ambulate 300' with use of LLE Swedish Knee cage independently in supervised setting and with supervision in community setting. His speech intelligibility has improved and he is able to utilize strategies for clarity as well as swallowing at modified independent level.  He is tolerating regular diet without any signs of aspiration."  Overall ADLs: Patient reports MI with ADLs although buttons do get frustrating. Transfers/ambulation related to ADLs: MI without knee brace at home Eating: R handed so able to hold fork in L hand to cut food with R hand Grooming: electric razor/trimmer UB Dressing: MI with some assistance for buttons LB Dressing: MI - managed jeans button/zipper and belt today Toileting: MI Bathing: MI - showers in standing generally, he did sit on shower seat today when he got a nose bleed Tub Shower transfers: walk in shower Equipment: Shower seat with back  IADLs:            Shopping: currently relying on spouse Light housekeeping: currently relying on spouse due to standing for long periods of time being difficult due to L hip  discomfort Meal Prep: currently relying on spouse - did not have activity tolerance this morning to try and cook eggs Community mobility: depends on spouse - RE: Driving - MD told him to wait 2 weeks and then [place] before resuming driving Medication management: wife puts them in a medicine box due to coordination difficulties Financial management: shares with spouse - mostly via phone Handwriting: 100% legible - R handed  MOBILITY STATUS: Independent and wears knee brace for long distance but not in the house  POSTURE COMMENTS:  No Significant postural limitations Sitting balance:  WFL  ACTIVITY TOLERANCE: Activity tolerance: LIMITED -- Had cleaned off the stove and washed something to make eggs this morning but needed to sit and rest.  FUNCTIONAL OUTCOME MEASURES  (Eval): Upper Extremity Functional Scale (UEFS): 68 as rated by patient and 48 by spouse 02/09/23 - UEFI 56 as related by patient (more self-aware of deficits)  03/09/23   0 to 80 score range of the UEFS -- Lower scores indicate that the subject is reporting increased difficulty with the activities as a result of their upper limb condition. While higher scores indicate less severity.   FOTO Severity: Slight (Intake FS: 60)    UPPER EXTREMITY ROM:    A/ROM - WNL L UE moves slower than R UE but achieves same ROM  UPPER EXTREMITY MMT:     R UE - 5/5 L UE - 4 to 4+/5  HAND FUNCTION: Grip strength: Right: 166.8 lbs; Left: 101.1  lbs, Lateral pinch: Right: 30 lbs, Left: 24 lbs, and 3 point pinch: Right: 30 lbs, Left: 20 lbs Hospital DC last week:  R Dominant Hand:  Average: 169.33 lbs L Nondominant (hemiparetic) Hand: Average: 91.67 lbs  COORDINATION: 9 Hole Peg test: Right: 33.94  sec; Left: 102.15 sec Box and Blocks:  Right 46 blocks, Left 27 blocks Used R hand to help L hand on occasion See goals for updates  SENSATION: Light touch: Impaired  Hot/Cold: Impaired   Patient was able to feel light touch on all fingers at least 1/3 times but had difficulty with median nerve distribution with lightest touch and would benefit from more formal testing.  He self reports hot and cold not being as distinct on his L side but noting improvements everyday.   EDEMA: No swelling  MUSCLE TONE: WFL  COGNITION: Overall cognitive status: Within functional limits for tasks assessed  VISION: Subjective report: No issues Baseline vision: No visual deficits Visual history:  N/A  VISION ASSESSMENT: WFL  PERCEPTION: WFL  PRAXIS: WFL  Evaluation OBSERVATIONS: Patient arrives today with his wife with L knee brace in place and ambulates without AE from lobby to therapy gym.   No loss of balance noted during mobility tasks during short observations of mobility in clinic. He demonstrates  full ROM of BUE although L UE moves at a slower rate than R UE and has obvious coordination deficits as noted by time to compete 9 hole peg test at 3x the time for R UE.    TODAY'S TREATMENT:        - Therapeutic activities completed for duration as noted below  Reassessed FM tasks with improvement in Box and Blocks test and 9 hole pegboard test:  Box and Blocks test.  Baseline @ eval: 27 blocks  01/05/23 - 32 blocks   02/09/23- 34 blocks TODAY - 42 blocks  Nine-hole peg LUE Baseline @ eval: 102 seconds  01/05/23 - 85 seconds  02/09/23 - 57.40 seconds TODAY - 44.65 seconds  Reviewed goals including Upper Extremity Functional Scale (UEFS): which he self rated @ 68 /80 at eval, whereas spouse rated him at 48/80.  Last month patient related himself has having mild to moderate difficulty with most tasks still with LUE and score was lower 56/80 but more likely accurate r/t self-awareness of deficits at that time.  Today he notes improvement to 75/80.    Stereognosis activities also demonstrated to progress HEPs for sensory stimulation and awareness of similar objects.  He did okay with 2/4 different block objects with difficulty between dice and smooth Uno dice (but difficulty on R side also) and disc/penny sized objects > 50% success.  Patient still not consistent with 1 versus 2 point discrimination but generally around 5 mm.    PATIENT EDUCATION: Education details: Discharge instructions - continue HEP (theraband, putty, coordination and sensory stimulation) Person educated: Patient Education method: Explanation Education comprehension: verbalized understanding  HOME EXERCISE PROGRAM: 12/15/22 -- Coordination handouts with images Access Code: [email or web address] URL: [email or web address] Date: 12/15/2022 * Finger Pinch and Pull with Putty  - 1 x daily - 3 sets - 10 reps  01/12/23: scapula stabilization/strengthening HEP   GOALS: Goals reviewed with patient? Yes   SHORT TERM GOALS:  Target date: 01/05/23  Will improve buttoning 4 buttons in less than 1 minute  and improve score on UEFI from little difficulty to no difficulty.  Baseline: 1:28  button/unbutton (1:20 to fasten & < 10 seconds to unfasten) Goal status: MET 01/05/23 fasten 42 secs - 56 seconds total time    2.  Patient will complete nine-hole peg with use of L UE in less than 1 minute.  Baseline: 102 seconds Goal status: MET 01/04/23: 85 seconds 02/09/23 - 57.40 seconds  3.  Pt will be able to place at least 32 blocks using left hand with completion of Box and Blocks test.  Baseline: 27 blocks Goal status: MET 01/04/23: 32 blocks  4.  Following sensory testing (2 pt discrimination and stereognosis 12/15/22) Patient will identify 5/5 items with vision occluded in L hand. Baseline: 0/3 items, Light touch 6-8 mm fingertips and self reported decreased awareness of hot/cold. Goal status: MET 01/05/23 - 3/6 items  03/09/23 - 5 mm, 5/5 distinct objects and good with hot/cold  5.  Patient will report no difficulty with tasks from UEFI test including lifting grocery bags, preparing food, and helping with IADLS in standing for improved UEFI score x 5 points. Baseline: Patient 68/80 & Spouse 48/80 Goal status: MET - see long term goal 02/09/23: Patient 56/80 (more self-aware of deficits) 03/09/23: 75/80  6.  Patient will have increased activity tolerance for standing 15 minutes while moving heavy objects (15+ pounds) to simulate work tasks. Baseline: Patient had to sit after getting dishes ready to make eggs this morning Goal status: MET 01/05/23 - Reports carrying 2 - 50 lb salt bags, one in each hand with plastic handle from house to the pool - down a driveway 30-50 yards without rest break.   LONG TERM GOALS: Target date: 02/04/23  Patient will complete nine-hole peg with use of L in 45 seconds or less.  Baseline: 102 seconds Goal status: MET 01/05/23 - 85 seconds 02/09/23 - 57.40 seconds 03/09/23 - 44.65  seconds  2.  Pt will be able to place at least >35 blocks using left hand with completion of Box and Blocks test.  Baseline: 27 blocks Goal status: MET 01/05/23 - 32 blocks 02/09/23 -  34 blocks 03/09/23 - 42 blocks  3.  Patient will report no difficulty with tasks from UEFI test for score of > 75/80 for minimal residual deficits/difficulties. Baseline: Patient 68/80 & Spouse 48/80 Goal status: MET 03/09/23 - 75/80  4.  Patient will identify 3 compensatory techniques to be MI with any sensory compensatory techniques for any residual deficits to minimize risk of self-injury. Baseline: Minor light touch deficits and temperature deficits Goal status: MET 01/05/23 - Identifies 3/6 items   5.  Patient will be MI with all HEPs for high level coordination and sustained activity tolerance. Baseline: Aware of some exercises from hospital  Goal status: MET  6.  Patient will have increased activity tolerance for standing 30 minutes while moving heavy objects (30+ pounds) to simulate work tasks and/or complete chores at home as appropriate (laundry/meal prep etc) Baseline:  Goal status: MET Carried 2 - 50 lb salt bags, one in each hand with plastic handle from house to the pool - down a driveway 30-50 yards without rest break.   ASSESSMENT:  CLINICAL IMPRESSION:  Pt made good progress towards established goals since prior OT 1 month ago and is appropriate for DC from OT at this time as he is continuing HEP on his own for FM skills and sensory impairments.    PERFORMANCE DEFICITS: in functional skills including ADLs, IADLs, coordination, dexterity, proprioception, sensation, Fine motor control, endurance, and UE functional use,   IMPAIRMENTS: are limiting patient from ADLs, IADLs, work, leisure, and social participation.   CO-MORBIDITIES: has co-morbidities such as h.o seizures and  unchanged nonenhancing right temporal lobe mass.that affects occupational performance. Patient will benefit from  skilled OT to address above impairments and improve overall function.  REHAB POTENTIAL: Excellent    OCCUPATIONAL THERAPY DISCHARGE SUMMARY  Visits from Start of Care: 11  Current functional level related to goals / functional outcomes: Pt has met all goals 6/6 short term and 6/6 long term goals to satisfactory levels and is pleased with outcomes.   Remaining deficits: Pt has slight ongoing functional deficits with L UE coordination and sensory awareness but has good compensatory strategies.   Education / Equipment: Pt has all needed materials and education. Pt understands how to continue on with self-management. See tx notes for more details.   Patient agrees to discharge due to max benefits received from outpatient occupational therapy at this time.   Victorino Sparrow, OT 03/09/2023, 12:05 PM

## 2023-03-09 NOTE — Therapy (Signed)
OUTPATIENT PHYSICAL THERAPY NEURO TREATMENT   Patient Name: Corey Chang MRN: [id number] DOB:07/10/1981, 42 y.o., male Today's Date: 03/09/2023  PCP: Eden Emms, MD REFERRING PROVIDER: Charlton Amor, PA-C  END OF SESSION:  PT End of Session - 03/09/23 1019     Visit Number 12    Number of Visits 17   9 + 8 at re-cert   Date for PT Re-Evaluation 04/22/23   pushed out to accomodate scheduling w/ OT   Authorization Type BLUE [employer]OSS BLUE [employer]IELD    PT Start Time 1016    PT Stop Time 1103    PT Time Calculation (min) 47 min    Equipment Utilized During Treatment Gait belt    Activity Tolerance Patient tolerated treatment well    Behavior During Therapy [facility] for tasks assessed/performed            Past Medical History:  Diagnosis Date   Allergic rhinitis 04/22/1997   Ankle fracture, right 07/2007   Brain cancer (HCC)    Cholelithiasis    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  He had external beam radiation in August 2001 with 54 Gy in 30 fractions on RTOG 9802 protocol.   He declined chemotherapy.    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   Hyponatremia 11/24/2022   Acute CVA (cerebrovascular accident) (HCC) 11/23/2022   Polycythemia 11/23/2022   Elevated LFTs 04/12/2022   Hyperlipidemia 04/12/2022   Acute cough 04/12/2022   Syncope 04/03/2022   Elevated troponin 04/03/2022   Avascular necrosis of bone of hip, left (HCC) 04/03/2022   Allergic rhinitis 04/03/2022   Eustachian tube dysfunction, left 10/06/2021   Bloody diarrhea 11/17/2017   History of  oligodendroglioma of brain 03/22/2017   Status post radiation therapy 03/22/2017   GERD (gastroesophageal reflux disease) 11/11/2016   Complex partial seizure (HCC) 12/21/2012   Oligodendroglioma (HCC) 10/14/2011   Seasonal allergies 07/08/2008   SMOKELESS TOBACCO ABUSE 08/02/2007   Intracranial tumor (HCC) 11/17/1999   Seizure disorder (HCC) 01/16/1997    ONSET DATE: 11/26/2022 (CVA)  REFERRING DIAG: I63.9 (ICD-10-CM) - Cerebral infarction, unspecified  THERAPY DIAG:  Other lack of coordination  Hemiplegia and hemiparesis following cerebral infarction affecting left non-dominant side (HCC)  Muscle weakness (generalized)  Other abnormalities of gait and mobility  Rationale for Evaluation and Treatment: Rehabilitation  SUBJECTIVE:  SUBJECTIVE STATEMENT: Pt states he thinks he slept on his right shoulder wrong as it is bothering him today.  He states he has not had any falls, but has had some trips due to having clutter on the floor at home.  He still has not been to fire dept to see what gym equipment is available.  Pt accompanied by: self  PERTINENT HISTORY: L hip AVN (ortho not planning to address unless significant limitations in function-followed by [facility]), ankle ORIF x3, oligodendroglioma s/p Postoperative radiotherapy (XRT), complex partial seizures  Right hand dominant male who was admitted to [facility] on 11/23/2022 with 1 day history of left-sided numbness with left lower extremity weakness followed by sudden onset of dizziness and diarrhea.  He was found to have acute infarct in medulla.  PAIN:  Are you having pain? 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 2. Unchanged nonenhancing right temporal lobe mass, previously reported to represent an oligodendroglioma.  COGNITION: Overall cognitive status: Within functional limits for tasks assessed   SENSATION: Light touch: Impaired ; left side feels more dull compared to right  COORDINATION: LE RAMS:  dysmetric Heel-to-shin:  limited by L Hip AVN  EDEMA:  None noted in BLE  MUSCLE TONE: None noted during functional assessment  POSTURE: No Significant postural limitations  LOWER EXTREMITY ROM:     Active  Right Eval Left Eval  Hip flexion Grossly WFL  Hip extension   Hip abduction   Hip adduction   Hip internal rotation   Hip external rotation   Knee flexion   Knee extension   Ankle dorsiflexion   Ankle plantarflexion    Ankle inversion    Ankle eversion     (Blank rows = not tested)  LOWER EXTREMITY MMT:    MMT Right Eval Left Eval  Hip flexion 5/5 4+/5  Hip extension    Hip abduction " "  Hip adduction " "  Hip  internal rotation    Hip external rotation    Knee flexion " "  Knee extension " "  Ankle dorsiflexion " "  Ankle plantarflexion    Ankle inversion    Ankle eversion    (Blank rows = not tested)  BED MOBILITY:  Sit to supine Complete Independence Supine to sit Complete Independence Rolling to Right Complete Independence Rolling to Left Complete Independence  TRANSFERS: Assistive device utilized: None  Sit to stand: Complete Independence Stand to  sit: Complete Independence Chair to chair: Complete Independence  GAIT: Gait pattern: decreased hip/knee flexion- Left and genu recurvatum- Left Distance walked: various clinic distances Assistive device utilized: None Level of assistance: SBA Comments: Pt has one instance of left LE buckling stating his hip was catching and hurting.  FUNCTIONAL TESTS:  5 times sit to stand: 6.72 seconds no UE support, intermittent touch of back of legs to chair, no LOB 6 minute walk test: To be assessed. Functional gait assessment: To be assessed.  PATIENT SURVEYS:  FOTO not captured at intake.  TODAY'S TREATMENT:                                                                                                                              - no knee cage no AD:  1190 feet  6 Blaze pods on random one color taps setting for improved UE/LE approximation, reaching, body symmetry, reaction time and scanning.  Performed on 1 minute intervals with 30 second rest periods.  Pt requires SBA guarding. Round 1:  Patient in quadruped on mat w/ 3 pods on each side of mat tapping w/ UE setup.  28 hits. Round 2:  " setup.  23 hits. Round 3:  " setup.  28 hits. Notable errors/deficits:  None significant. 6 Blaze pods on distraction and scanning setting for improved UE/LE approximation, coordination, scanning, reaction time and cognitive dual tasking.  Performed on 1 minute intervals with 30 second rest periods.  Pt requires distant supervision guarding. Round  1:  In quadruped patient lateral crawls to correct color w/ UE taps to 2 primary colors and avoiding 2 distracting colors setup.  35 hits. Round 2:  " setup.  37 hits. Round 3:  " setup.  40 hits. Notable errors/deficits:  None significant, hip is somewhat bothersome following task requiring prolonged rest.  6 Blaze pods on distraction and scanning setting for improved scanning, reaction time and cognitive dual tasking.  Performed on 1 minute intervals with 30 second rest periods.  Pt requires SBA guarding. Round 1:  LE taps w/ 3lb ankle weights to 2 primary colors and avoiding 2 distracting colors setup.  27 hits. Round 2:  " setup.  26 hits. Round 3:  " setup.  27 hits. Notable errors/deficits:  None significant.  PATIENT EDUCATION: Education details: Progress towards STG.  Go to fire dept for equipment/gym use.  Bring boots if wanting to practice in those here again.  Discussed purpose of blaze pods tasks. Person educated: Patient Education method: Explanation Education comprehension: verbalized understanding  HOME EXERCISE PROGRAM: Access Code: [email or web address] URL: [email or web address] Date: 12/15/2022 Prepared by: Peter Congo  Exercises - Staggered Sit-to-Stand  - 1 x daily - 7 x weekly - 3 sets - 10 reps - Standing Terminal Knee Extension with Resistance  - 1 x daily - 7 x weekly - 3 sets - 10 reps - Staggered Stance Squat  - 1 x daily - 7 x weekly -  3 sets - 10 reps - Forward Step Down with Heel Tap and Counter Support  - 1 x daily - 7 x weekly - 3 sets - 10 reps  GOALS:  NEW GOALS:  Goals reviewed with patient? Yes  SHORT TERM GOALS:   Target date: 03/11/2023  Pt will ambulate >/=1200 feet on to demonstrate improved functional endurance for home and community participation. Baseline: 964 ft SBA-CGA (7/24); 1190 feet (8/21) Goal status: IN PROGRESS  LONG TERM GOALS:  Target date: 04/08/2023  Pt will be independent and compliant with advanced strength and  balance HEP in order to maintain functional progress and improve mobility for return to work as able. Baseline: To be advanced. Goal status: INITIAL  2.  Pt will improve FGA score to >/=26/30 in order to demonstrate improved balance and decreased fall risk. Baseline: 23/30 (7/24) Goal status: INITIAL  3.  Pt will ambulate >/=500 feet with LRAD over unlevel indoor and outdoor surfaces demonstrating improved left knee control at no more than modI level of assist to promote household and community access. Baseline: level surfaces 964 ft no AD SBA-CGA 3 instances of right toe catch (7/24) Goal status: INITIAL  4.  Patient will carry >/=20 lbs for at least 250 feet in order to demonstrate improved capability to return to work duties. Baseline: To be assessed. Goal status: INITIAL  ASSESSMENT:  CLINICAL IMPRESSION: Assessed today with patient ambulating 1190 feet without issue.  He narrowly missed goal level by insignificant distance of 10 feet.  He did well with dynamic approximation and cognitive dual tasking.  He is overall progressing well and continues to benefit from skilled PT interventions to address lingering deficits.  OBJECTIVE IMPAIRMENTS: Abnormal gait, decreased activity tolerance, decreased balance, decreased coordination, decreased endurance, decreased knowledge of use of DME, difficulty walking, decreased strength, and impaired sensation.   ACTIVITY LIMITATIONS: carrying, lifting, squatting, and locomotion level  PARTICIPATION LIMITATIONS: driving, community activity, and occupation  PERSONAL FACTORS: Fitness, Past/current experiences, Profession, Sex, Transportation, and 1-2 comorbidities: Left hip AVN and right oligodendroglioma  are also affecting patient's functional outcome.   REHAB POTENTIAL: Excellent  CLINICAL DECISION MAKING: Evolving/moderate complexity  EVALUATION COMPLEXITY: Moderate  PLAN:  PT FREQUENCY: 1x/week + 1x/week  PT DURATION: 8 weeks + 8  weeks  PLANNED INTERVENTIONS: Therapeutic exercises, Therapeutic activity, Neuromuscular re-education, Balance training, Gait training, Patient/Family education, Self Care, Stair training, Vestibular training, DME instructions, Manual therapy, and Re-evaluation  PLAN FOR NEXT SESSION: Did he go to fire station to use gym or try getting in truck?, shoulder and farmer carry, balance bar (pipe w/ wts), tandem, trampoline, walking long unlevel distances in work boots, surge + lunges, body blade, lateral bounding, stance jacks vs jumping jacks, rebounder  Camille Bal, PT, DPT  03/09/2023, 11:06 AM

## 2023-03-16 ENCOUNTER — Encounter: Payer: BC Managed Care – PPO | Admitting: Occupational Therapy

## 2023-03-16 ENCOUNTER — Encounter: Payer: Self-pay | Admitting: Physical Therapy

## 2023-03-16 ENCOUNTER — Ambulatory Visit: Payer: BC Managed Care – PPO | Admitting: Physical Therapy

## 2023-03-16 DIAGNOSIS — R208 Other disturbances of skin sensation: Secondary | ICD-10-CM

## 2023-03-16 DIAGNOSIS — R278 Other lack of coordination: Secondary | ICD-10-CM

## 2023-03-16 DIAGNOSIS — I69354 Hemiplegia and hemiparesis following cerebral infarction affecting left non-dominant side: Secondary | ICD-10-CM

## 2023-03-16 DIAGNOSIS — M6281 Muscle weakness (generalized): Secondary | ICD-10-CM

## 2023-03-16 DIAGNOSIS — R2689 Other abnormalities of gait and mobility: Secondary | ICD-10-CM

## 2023-03-16 NOTE — Therapy (Signed)
OUTPATIENT PHYSICAL THERAPY NEURO TREATMENT   Patient Name: Corey Chang MRN: [id number] DOB:[birth year], 42 y.o., male Today's Date: 03/16/2023  PCP: Eden Emms, MD REFERRING PROVIDER: Charlton Amor, PA-C  END OF SESSION:  PT End of Session - 03/16/23 1108     Visit Number 13    Number of Visits 17   9 + 8 at re-cert   Date for PT Re-Evaluation 04/22/23   pushed out to accomodate scheduling w/ OT   Authorization Type BLUE CROSS BLUE [employer]IELD    PT Start Time 1105    PT Stop Time 1146    PT Time Calculation (min) 41 min    Equipment Utilized During Treatment Gait belt    Activity Tolerance Patient tolerated treatment well    Behavior During Therapy [facility] for tasks assessed/performed            Past Medical History:  Diagnosis Date   Allergic rhinitis 04/22/1997   Ankle fracture, right 07/2007   Brain cancer (HCC)    Cholelithiasis    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  He had external beam radiation in August 2001 with 54 Gy in 30 fractions on RTOG 9802 protocol.   He declined chemotherapy.    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   Hyponatremia 11/24/2022   Acute CVA (cerebrovascular accident) (HCC) 11/23/2022   Polycythemia 11/23/2022   Elevated LFTs 04/12/2022   Hyperlipidemia 04/12/2022   Acute cough 04/12/2022   Syncope 04/03/2022   Elevated troponin 04/03/2022   Avascular necrosis of bone of hip, left (HCC) 04/03/2022   Allergic rhinitis 04/03/2022   Eustachian tube dysfunction, left 10/06/2021   Bloody diarrhea 11/17/2017   History of  oligodendroglioma of brain 03/22/2017   Status post radiation therapy 03/22/2017   GERD (gastroesophageal reflux disease) 11/11/2016   Complex partial seizure (HCC) 12/21/2012   Oligodendroglioma (HCC) 10/14/2011   Seasonal allergies 07/08/2008   SMOKELESS TOBACCO ABUSE 08/02/2007   Intracranial tumor (HCC) 11/17/1999   Seizure disorder (HCC) 01/16/1997    ONSET DATE: 11/26/2022 (CVA)  REFERRING DIAG: I63.9 (ICD-10-CM) - Cerebral infarction, unspecified  THERAPY DIAG:  Other lack of coordination  Hemiplegia and hemiparesis following cerebral infarction affecting left non-dominant side (HCC)  Muscle weakness (generalized)  Other abnormalities of gait and mobility  Other disturbances of skin sensation  Rationale for Evaluation and Treatment: Rehabilitation  SUBJECTIVE:  SUBJECTIVE STATEMENT: Pt denies pain or recent falls.  No acute changes.  He continues working on stairs and walking at home.  He has not gone to the fire station to assess available gym equipment.  Pt accompanied by: self and son  PERTINENT HISTORY: L hip AVN (ortho not planning to address unless significant limitations in function-followed by [facility]), ankle ORIF x3, oligodendroglioma s/p Postoperative radiotherapy (XRT), complex partial seizures  Right hand dominant male who was admitted to [facility] on 11/23/2022 with 1 day history of left-sided numbness with left lower extremity weakness followed by sudden onset of dizziness and diarrhea.  He was found to have acute infarct in medulla.  PAIN:  Are you having pain? 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 2. Unchanged nonenhancing right temporal lobe mass, previously reported to represent an oligodendroglioma.  COGNITION: Overall cognitive status: Within functional limits for tasks assessed   SENSATION: Light touch: Impaired ; left side feels more dull compared to right  COORDINATION: LE RAMS:  dysmetric Heel-to-shin:  limited by L Hip AVN  EDEMA:  None noted in BLE  MUSCLE TONE: None noted during functional assessment  POSTURE: No Significant postural limitations  LOWER EXTREMITY ROM:     Active  Right Eval Left Eval  Hip flexion Grossly WFL  Hip extension   Hip abduction   Hip adduction   Hip internal rotation   Hip external rotation   Knee flexion   Knee extension   Ankle dorsiflexion   Ankle plantarflexion    Ankle inversion    Ankle eversion     (Blank rows = not tested)  LOWER EXTREMITY MMT:    MMT Right Eval Left Eval  Hip flexion 5/5 4+/5  Hip extension    Hip abduction " "  Hip adduction " "  Hip internal rotation    Hip external rotation    Knee flexion " "  Knee extension " "  Ankle dorsiflexion " "  Ankle plantarflexion    Ankle inversion    Ankle eversion    (Blank rows = not tested)  BED MOBILITY:  Sit to  supine Complete Independence Supine to sit Complete Independence Rolling to Right Complete Independence Rolling to Left Complete Independence  TRANSFERS: Assistive device utilized: None  Sit to stand: Complete Independence Stand to sit: Complete Independence Chair to chair: Complete Independence  GAIT: Gait pattern: decreased hip/knee flexion- Left and genu recurvatum- Left Distance walked: various clinic distances Assistive device utilized: None Level of assistance: SBA Comments: Pt has one instance of left LE buckling stating his hip was  catching and hurting.  FUNCTIONAL TESTS:  5 times sit to stand: 6.72 seconds no UE support, intermittent touch of back of legs to chair, no LOB 6 minute walk test: To be assessed. Functional gait assessment: To be assessed.  PATIENT SURVEYS:  FOTO not captured at intake.  TODAY'S TREATMENT:                                                                                                                              -Treadmill on 4% incline over 8 minutes using BUE support at 2.0 mph, better left knee control noted in stance today, pt consistently clearing BLE -Single leg squats w/ BUE support on ballet bar 2x10 each LE, LLE in reduced ROM, notable waver -2kg rebounder BLE stance x20 > RLE elevated on 6" step x20 > right toe touch for increased L SLS x20 reps -Bouncing (toes not leaving surface) w/ BUE support on trampoline x23minute > bouncing jacks x1 minute > alternating stride jumps x1 minute  PATIENT EDUCATION: Education details: Continue HEP, walking regularly, and stairs for strengthening. Person educated: Patient Education method: Explanation Education comprehension: verbalized understanding  HOME EXERCISE PROGRAM: Access Code: [email or web address] URL: [email or web address] Date: 12/15/2022 Prepared by: Peter Congo  Exercises - Staggered Sit-to-Stand  - 1 x daily - 7 x weekly - 3 sets - 10 reps - Standing Terminal Knee  Extension with Resistance  - 1 x daily - 7 x weekly - 3 sets - 10 reps - Staggered Stance Squat  - 1 x daily - 7 x weekly - 3 sets - 10 reps - Forward Step Down with Heel Tap and Counter Support  - 1 x daily - 7 x weekly - 3 sets - 10 reps -Use sink for UE support and practice single leg squats and SLS progressing away from UE support as able over time/repetition   GOALS:  NEW GOALS:  Goals reviewed with patient? Yes  SHORT TERM GOALS:   Target date: 03/11/2023  Pt will ambulate >/=1200 feet on to demonstrate improved functional endurance for home and community participation. Baseline: 964 ft SBA-CGA (7/24); 1190 feet (8/21) Goal status: IN PROGRESS  LONG TERM GOALS:  Target date: 04/08/2023  Pt will be independent and compliant with advanced strength and balance HEP in order to maintain functional progress and improve mobility for return to work as able. Baseline: To be advanced. Goal status: INITIAL  2.  Pt will improve FGA score to >/=26/30 in order to demonstrate improved balance and decreased fall risk. Baseline: 23/30 (7/24) Goal status: INITIAL  3.  Pt will ambulate >/=500 feet with LRAD over unlevel indoor and outdoor surfaces demonstrating improved left knee control at no more than modI level of assist to promote household and community access. Baseline: level surfaces 964 ft no AD SBA-CGA 3 instances of right toe catch (7/24) Goal status: INITIAL  4.  Patient will carry >/=20 lbs for at least 250 feet in order to demonstrate  improved capability to return to work duties. Baseline: To be assessed. Goal status: INITIAL  ASSESSMENT:  CLINICAL IMPRESSION: Ongoing attempts to address left SLS and strength as pt remains weak, but showed improvements in gait mechanics on treadmill today.  He was very challenged by coordination and power production with trampoline jacks task.  Will review this task next session and progress per POC.  OBJECTIVE IMPAIRMENTS: Abnormal gait,  decreased activity tolerance, decreased balance, decreased coordination, decreased endurance, decreased knowledge of use of DME, difficulty walking, decreased strength, and impaired sensation.   ACTIVITY LIMITATIONS: carrying, lifting, squatting, and locomotion level  PARTICIPATION LIMITATIONS: driving, community activity, and occupation  PERSONAL FACTORS: Fitness, Past/current experiences, Profession, Sex, Transportation, and 1-2 comorbidities: Left hip AVN and right oligodendroglioma  are also affecting patient's functional outcome.   REHAB POTENTIAL: Excellent  CLINICAL DECISION MAKING: Evolving/moderate complexity  EVALUATION COMPLEXITY: Moderate  PLAN:  PT FREQUENCY: 1x/week + 1x/week  PT DURATION: 8 weeks + 8 weeks  PLANNED INTERVENTIONS: Therapeutic exercises, Therapeutic activity, Neuromuscular re-education, Balance training, Gait training, Patient/Family education, Self Care, Stair training, Vestibular training, DME instructions, Manual therapy, and Re-evaluation  PLAN FOR NEXT SESSION: Did he go to fire station to use gym or try getting in truck?, shoulder and farmer carry, balance bar (pipe w/ wts), tandem, walking long unlevel distances in work boots, surge + lunges, body blade, lateral bounding, stance jacks vs jumping jacks  Camille Bal, PT, DPT  03/16/2023, 11:51 AM

## 2023-03-23 ENCOUNTER — Encounter: Payer: Self-pay | Admitting: Neurology

## 2023-03-23 ENCOUNTER — Ambulatory Visit: Payer: BC Managed Care – PPO | Attending: Physician Assistant | Admitting: Physical Therapy

## 2023-03-23 ENCOUNTER — Encounter: Payer: BC Managed Care – PPO | Admitting: Occupational Therapy

## 2023-03-23 ENCOUNTER — Other Ambulatory Visit: Payer: Self-pay

## 2023-03-23 ENCOUNTER — Encounter: Payer: Self-pay | Admitting: Physical Therapy

## 2023-03-23 ENCOUNTER — Other Ambulatory Visit (HOSPITAL_COMMUNITY): Payer: Self-pay

## 2023-03-23 ENCOUNTER — Ambulatory Visit: Payer: BC Managed Care – PPO | Admitting: Neurology

## 2023-03-23 VITALS — BP 129/88 | HR 81 | Ht 71.0 in | Wt 184.0 lb

## 2023-03-23 DIAGNOSIS — I69354 Hemiplegia and hemiparesis following cerebral infarction affecting left non-dominant side: Secondary | ICD-10-CM | POA: Diagnosis present

## 2023-03-23 DIAGNOSIS — G40219 Localization-related (focal) (partial) symptomatic epilepsy and epileptic syndromes with complex partial seizures, intractable, without status epilepticus: Secondary | ICD-10-CM

## 2023-03-23 DIAGNOSIS — C712 Malignant neoplasm of temporal lobe: Secondary | ICD-10-CM

## 2023-03-23 DIAGNOSIS — M6281 Muscle weakness (generalized): Secondary | ICD-10-CM | POA: Insufficient documentation

## 2023-03-23 DIAGNOSIS — R2689 Other abnormalities of gait and mobility: Secondary | ICD-10-CM | POA: Diagnosis present

## 2023-03-23 DIAGNOSIS — I639 Cerebral infarction, unspecified: Secondary | ICD-10-CM

## 2023-03-23 DIAGNOSIS — R278 Other lack of coordination: Secondary | ICD-10-CM | POA: Diagnosis present

## 2023-03-23 DIAGNOSIS — R208 Other disturbances of skin sensation: Secondary | ICD-10-CM | POA: Diagnosis present

## 2023-03-23 MED ORDER — LAMOTRIGINE ER 200 MG PO TB24: 200.0000 mg | ORAL_TABLET | Freq: Every evening | ORAL | 3 refills | Status: DC

## 2023-03-23 MED ORDER — LEVETIRACETAM 500 MG PO TABS
1500.0000 mg | ORAL_TABLET | Freq: Two times a day (BID) | ORAL | 3 refills | Status: DC
Start: 2023-03-23 — End: 2023-10-11
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
## 2023-03-23 NOTE — Therapy (Signed)
OUTPATIENT PHYSICAL THERAPY NEURO TREATMENT   Patient Name: Corey Chang MRN: [id number] DOB:08-Nov-1980, 42 y.o., male Today's Date: 03/23/2023  PCP: Eden Emms, MD REFERRING PROVIDER: Charlton Amor, PA-C  END OF SESSION:  PT End of Session - 03/23/23 1105     Visit Number 14    Number of Visits 17   9 + 8 at re-cert   Date for PT Re-Evaluation 04/22/23   pushed out to accomodate scheduling w/ OT   Authorization Type BLUE [employer]    PT Start Time 1102    PT Stop Time 1147    PT Time Calculation (min) 45 min    Equipment Utilized During Treatment Gait belt    Activity Tolerance Patient tolerated treatment well    Behavior During Therapy [facility] for tasks assessed/performed            Past Medical History:  Diagnosis Date   Allergic rhinitis 04/22/1997   Ankle fracture, right 07/2007   Brain cancer (HCC)    Cholelithiasis    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  He had external beam radiation in August 2001 with 54 Gy in 30 fractions on RTOG 9802 protocol.   He declined chemotherapy.    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   Hyponatremia 11/24/2022   Acute CVA (cerebrovascular accident) (HCC) 11/23/2022   Polycythemia 11/23/2022   Elevated LFTs 04/12/2022   Hyperlipidemia 04/12/2022   Acute cough 04/12/2022   Syncope 04/03/2022   Elevated troponin 04/03/2022   Avascular necrosis of bone of hip, left (HCC) 04/03/2022   Allergic rhinitis 04/03/2022   Eustachian tube dysfunction, left 10/06/2021   Bloody diarrhea 11/17/2017   History of  oligodendroglioma of brain 03/22/2017   Status post radiation therapy 03/22/2017   GERD (gastroesophageal reflux disease) 11/11/2016   Complex partial seizure (HCC) 12/21/2012   Oligodendroglioma (HCC) 10/14/2011   Seasonal allergies 07/08/2008   SMOKELESS TOBACCO ABUSE 08/02/2007   Intracranial tumor (HCC) 11/17/1999   Seizure disorder (HCC) 01/16/1997    ONSET DATE: 11/26/2022 (CVA)  REFERRING DIAG: I63.9 (ICD-10-CM) - Cerebral infarction, unspecified  THERAPY DIAG:  Other lack of coordination  Hemiplegia and hemiparesis following cerebral infarction affecting left non-dominant side (HCC)  Muscle weakness (generalized)  Other abnormalities of gait and mobility  Other disturbances of skin sensation  Rationale for Evaluation and Treatment: Rehabilitation  SUBJECTIVE:  SUBJECTIVE STATEMENT: Pt denies pain or recent falls.  Pt reports that he had a conversation with rehab MD regarding return to work and that it is up-in-the-air if he will be able to due to risk being on long-term blood thinners due to genetic condition.  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  Pt accompanied by: self and son  PERTINENT HISTORY: L hip AVN (ortho not planning to address unless significant limitations in function-[facility]ed by [facility]), ankle ORIF x3, oligodendroglioma s/p Postoperative radiotherapy (XRT), complex partial seizures  Right hand dominant male who was admitted to [facility] on 11/23/2022 with 1 day history of left-sided numbness with left lower extremity weakness followed by sudden onset of dizziness and diarrhea.  He was found to have acute infarct in medulla.  PAIN:  Are you having pain?  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 2. Unchanged nonenhancing right temporal lobe mass, previously reported to represent an oligodendroglioma.  COGNITION: Overall cognitive status: Within functional limits for tasks assessed   SENSATION: Light touch: Impaired ; left side feels more dull compared to right  COORDINATION: LE RAMS:  dysmetric Heel-to-shin:  limited by L Hip AVN  EDEMA:  None noted in BLE  MUSCLE TONE: None noted during functional assessment  POSTURE: No Significant postural limitations  LOWER EXTREMITY ROM:     Active  Right Eval Left Eval  Hip flexion Grossly WFL  Hip extension   Hip abduction   Hip adduction   Hip internal rotation   Hip external rotation   Knee flexion   Knee extension   Ankle dorsiflexion   Ankle plantarflexion    Ankle inversion    Ankle eversion     (Blank rows = not tested)  LOWER EXTREMITY MMT:    MMT Right Eval Left Eval  Hip flexion 5/5 4+/5  Hip extension    Hip abduction " "  Hip adduction " "  Hip internal rotation    Hip  external rotation    Knee flexion " "  Knee extension " "  Ankle dorsiflexion " "  Ankle plantarflexion    Ankle inversion    Ankle eversion    (Blank rows = not tested)  BED MOBILITY:  Sit to supine Complete Independence Supine to sit Complete Independence Rolling to Right Complete Independence Rolling to Left Complete Independence  TRANSFERS: Assistive device utilized: None  Sit to stand: Complete Independence Stand to sit: Complete Independence Chair  to chair: Complete Independence  GAIT: Gait pattern: decreased hip/knee flexion- Left and genu recurvatum- Left Distance walked: various clinic distances Assistive device utilized: None Level of assistance: SBA Comments: Pt has one instance of left LE buckling stating his hip was catching and hurting.  FUNCTIONAL TESTS:  5 times sit to stand: 6.72 seconds no UE support, intermittent touch of back of legs to chair, no LOB 6 minute walk test: To be assessed. Functional gait assessment: To be assessed.  PATIENT SURVEYS:  FOTO not captured at intake.  TODAY'S TREATMENT:                                                                                                                              -Discussed pt finding and returning to prior hobbies with caution and respect to ongoing blood thinners and physical deficits to be mindful of.  He wants to return to hunting and PT cautions pt regarding fall risk on unlevel terrain and LUE/LLE changes post-CVA.  Encouraged pt to be evaluated if he falls especially if he hits his head.  Discussed patient listing out possible risks to hobbies and ways to modify these tasks for safer performance so he can return to enjoyed tasks with less risk.  -Bosu surge forward lunges x10 front carry > shoulder carry x10 alt LE > repeated holding on opposite shoulder x10 -Alt LE elevated on 14" box overhead body blade x30 seconds on RUE > x2 minute front oriented blade due to increased LUE fatigue and loss  of dexterity  -Discussion of practicing drinking motion on left arm to re-establish motor control, proprioceptive and kinesthetic awareness as this remains difficult. -Discussion of replication of body blade task and benefits using a pool noodle at home.  On trampoline: -Bouncing x 30 seconds BUE support -Jumping x30 seconds BUE support > x30 seconds no UE support -Plank jacks BUE support x30 seconds > no UE support x 30 seconds Variable rest between each task.  Pt requiring monitored seated rest due to dyspnea and RLE fatigue worse than left following trampoline task.  He is able to ambulate out of clinic independently after x4 minutes of seated rest - no shortness of breath remaining and no notable instability on standing and walking.  PATIENT EDUCATION: Education details: Continue HEP, walking regularly, and problem solve hobbies he would like to return to/bring tasks to work on to next session based on list. Person educated: Patient Education method: Explanation Education comprehension: verbalized understanding  HOME EXERCISE PROGRAM: Access Code: [email or web address] URL: [email or web address] Date: 12/15/2022 Prepared by: Peter Congo  Exercises - Staggered Sit-to-Stand  - 1 x daily - 7 x weekly - 3 sets - 10 reps - Standing Terminal Knee Extension with Resistance  - 1 x daily - 7 x weekly - 3 sets - 10 reps - Staggered Stance Squat  - 1 x daily - 7 x weekly - 3 sets - 10 reps - Forward Step Down with Heel Tap  and Counter Support  - 1 x daily - 7 x weekly - 3 sets - 10 reps -Use sink for UE support and practice single leg squats and SLS progressing away from UE support as able over time/repetition   GOALS:  NEW GOALS:  Goals reviewed with patient? Yes  SHORT TERM GOALS:   Target date: 03/11/2023  Pt will ambulate >/=1200 feet on to demonstrate improved functional endurance for home and community participation. Baseline: 964 ft SBA-CGA (7/24); 1190 feet  (8/21) Goal status: IN PROGRESS  LONG TERM GOALS:  Target date: 04/08/2023  Pt will be independent and compliant with advanced strength and balance HEP in order to maintain functional progress and improve mobility for return to work as able. Baseline: To be advanced. Goal status: INITIAL  2.  Pt will improve FGA score to >/=26/30 in order to demonstrate improved balance and decreased fall risk. Baseline: 23/30 (7/24) Goal status: INITIAL  3.  Pt will ambulate >/=500 feet with LRAD over unlevel indoor and outdoor surfaces demonstrating improved left knee control at no more than modI level of assist to promote household and community access. Baseline: level surfaces 964 ft no AD SBA-CGA 3 instances of right toe catch (7/24) Goal status: INITIAL  4.  Patient will carry >/=20 lbs for at least 250 feet in order to demonstrate improved capability to return to work duties. Baseline: To be assessed. Goal status: INITIAL  ASSESSMENT:  CLINICAL IMPRESSION: Emphasis of skilled session on addressing ongoing power production and dynamic stability.  Pt does well with trampoline tasks today, better than prior session attempts progressing away from UE support with better quad control.  He remains challenged by LUE coordination and decreased proprioceptive and kinesthetic awareness.  Will continue per POC with less emphasis on return to work based on potential changes.  OBJECTIVE IMPAIRMENTS: Abnormal gait, decreased activity tolerance, decreased balance, decreased coordination, decreased endurance, decreased knowledge of use of DME, difficulty walking, decreased strength, and impaired sensation.   ACTIVITY LIMITATIONS: carrying, lifting, squatting, and locomotion level  PARTICIPATION LIMITATIONS: driving, community activity, and occupation  PERSONAL FACTORS: Fitness, Past/current experiences, Profession, Sex, Transportation, and 1-2 comorbidities: Left hip AVN and right oligodendroglioma  are also  affecting patient's functional outcome.   REHAB POTENTIAL: Excellent  CLINICAL DECISION MAKING: Evolving/moderate complexity  EVALUATION COMPLEXITY: Moderate  PLAN:  PT FREQUENCY: 1x/week + 1x/week  PT DURATION: 8 weeks + 8 weeks  PLANNED INTERVENTIONS: Therapeutic exercises, Therapeutic activity, Neuromuscular re-education, Balance training, Gait training, Patient/Family education, Self Care, Stair training, Vestibular training, DME instructions, Manual therapy, and Re-evaluation  PLAN FOR NEXT SESSION: shoulder and farmer carry, balance bar (pipe w/ wts), tandem, walking long unlevel distances in work boots,  lateral bounding, resisted walking and perturbations - in grass  Camille Bal, PT, DPT  03/23/2023, 11:49 AM

## 2023-03-23 NOTE — Patient Instructions (Signed)
Always good to see you. Continue all your medications. Continue PT and OT. Follow-up in 6 months, call for any changes   Seizure Precautions: 1. If medication has been prescribed for you to prevent seizures, take it exactly as directed.  Do not stop taking the medicine without talking to your doctor first, even if you have not had a seizure in a long time.   2. Avoid activities in which a seizure would cause danger to yourself or to others.  Don't operate dangerous machinery, swim alone, or climb in high or dangerous places, such as on ladders, roofs, or girders.  Do not drive unless your doctor says you may.  3. If you have any warning that you may have a seizure, lay down in a safe place where you can't hurt yourself.    4.  No driving for 6 months from last seizure, as per [facility].   Please refer to the following link on the [employer]'s website for more information: http://www.epilepsyfoundation.org/answerplace/Social/driving/drivingu.cfm   5.  Maintain good sleep hygiene. Avoid alcohol.  6.  Contact your doctor if you have any problems that may be related to the medicine you are taking.  7.  Call 911 and bring the patient back to the ED if:        A.  The seizure lasts longer than 5 minutes.       B.  The patient doesn't awaken shortly after the seizure  C.  The patient has new problems such as difficulty seeing, speaking or moving  D.  The patient was injured during the seizure  E.  The patient has a temperature over 102 F (39C)  F.  The patient vomited and now is having trouble breathing

## 2023-03-30 ENCOUNTER — Encounter: Payer: BC Managed Care – PPO | Admitting: Occupational Therapy

## 2023-03-30 ENCOUNTER — Encounter: Payer: Self-pay | Admitting: Physical Therapy

## 2023-03-30 ENCOUNTER — Ambulatory Visit: Payer: BC Managed Care – PPO | Admitting: Physical Therapy

## 2023-03-30 DIAGNOSIS — I69354 Hemiplegia and hemiparesis following cerebral infarction affecting left non-dominant side: Secondary | ICD-10-CM

## 2023-03-30 DIAGNOSIS — R278 Other lack of coordination: Secondary | ICD-10-CM | POA: Diagnosis not present

## 2023-03-30 DIAGNOSIS — M6281 Muscle weakness (generalized): Secondary | ICD-10-CM

## 2023-03-30 DIAGNOSIS — R2689 Other abnormalities of gait and mobility: Secondary | ICD-10-CM

## 2023-03-30 DIAGNOSIS — R208 Other disturbances of skin sensation: Secondary | ICD-10-CM

## 2023-03-30 NOTE — Therapy (Signed)
OUTPATIENT PHYSICAL THERAPY NEURO TREATMENT   Patient Name: Corey Chang MRN: [id number] DOB:Sep 09, 1980, 42 y.o., male Today's Date: 03/30/2023  PCP: Eden Emms, MD REFERRING PROVIDER: Charlton Amor, PA-C  END OF SESSION:  PT End of Session - 03/30/23 1103     Visit Number 15    Number of Visits 17   9 + 8 at re-cert   Date for PT Re-Evaluation 04/22/23   pushed out to accomodate scheduling w/ OT   Authorization Type BLUE CROSS BLUE [employer]IELD    PT Start Time 1101    PT Stop Time 1144    PT Time Calculation (min) 43 min    Equipment Utilized During Treatment Gait belt    Activity Tolerance Patient tolerated treatment well    Behavior During Therapy [facility] for tasks assessed/performed            Past Medical History:  Diagnosis Date   Allergic rhinitis 04/22/1997   Ankle fracture, right 07/2007   Brain cancer (HCC)    Cholelithiasis    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  He had external beam radiation in August 2001 with 54 Gy in 30 fractions on RTOG 9802 protocol.   He declined chemotherapy.    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   Hyponatremia 11/24/2022   Acute CVA (cerebrovascular accident) (HCC) 11/23/2022   Polycythemia 11/23/2022   Elevated LFTs 04/12/2022   Hyperlipidemia 04/12/2022   Acute cough 04/12/2022   Syncope 04/03/2022   Elevated troponin 04/03/2022   Avascular necrosis of bone of hip, left (HCC) 04/03/2022   Allergic rhinitis 04/03/2022   Eustachian tube dysfunction, left 10/06/2021   Bloody diarrhea 11/17/2017   History of  oligodendroglioma of brain 03/22/2017   Status post radiation therapy 03/22/2017   GERD (gastroesophageal reflux disease) 11/11/2016   Complex partial seizure (HCC) 12/21/2012   Oligodendroglioma (HCC) 10/14/2011   Seasonal allergies 07/08/2008   SMOKELESS TOBACCO ABUSE 08/02/2007   Intracranial tumor (HCC) 11/17/1999   Seizure disorder (HCC) 01/16/1997    ONSET DATE: 11/26/2022 (CVA)  REFERRING DIAG: I63.9 (ICD-10-CM) - Cerebral infarction, unspecified  THERAPY DIAG:  Other lack of coordination  Hemiplegia and hemiparesis following cerebral infarction affecting left non-dominant side (HCC)  Muscle weakness (generalized)  Other abnormalities of gait and mobility  Other disturbances of skin sensation  Rationale for Evaluation and Treatment: Rehabilitation  SUBJECTIVE:  SUBJECTIVE STATEMENT: Pt denies pain or recent falls.  He states he stayed up too late last night and is tired today.  Pt accompanied by: self  PERTINENT HISTORY: L hip AVN (ortho not planning to address unless significant limitations in function-followed by [facility]), ankle ORIF x3, oligodendroglioma s/p Postoperative radiotherapy (XRT), complex partial seizures  Right hand dominant male who was admitted to [facility] on 11/23/2022 with 1 day history of left-sided numbness with left lower extremity weakness followed by sudden onset of dizziness and diarrhea.  He was found to have acute infarct in medulla.  PAIN:  Are you having pain? 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 2. Unchanged nonenhancing right temporal lobe mass, previously reported to represent an oligodendroglioma.  COGNITION: Overall cognitive status: Within functional limits for tasks assessed   SENSATION: Light touch: Impaired ; left side feels more dull compared to right  COORDINATION: LE RAMS:  dysmetric Heel-to-shin:  limited by L Hip AVN  EDEMA:  None noted in BLE  MUSCLE TONE: None noted during functional assessment  POSTURE: No Significant postural limitations  LOWER EXTREMITY ROM:     Active  Right Eval Left Eval  Hip flexion Grossly WFL  Hip extension   Hip abduction   Hip adduction   Hip internal rotation   Hip external rotation   Knee flexion   Knee extension   Ankle dorsiflexion   Ankle plantarflexion    Ankle inversion    Ankle eversion     (Blank rows = not tested)  LOWER EXTREMITY MMT:    MMT Right Eval Left Eval  Hip flexion 5/5 4+/5  Hip extension    Hip abduction " "  Hip adduction " "  Hip internal rotation    Hip external rotation    Knee flexion " "  Knee extension " "  Ankle dorsiflexion " "  Ankle plantarflexion    Ankle inversion    Ankle eversion    (Blank rows = not tested)  BED MOBILITY:  Sit to supine Complete Independence Supine to sit Complete Independence Rolling to Right Complete  Independence Rolling to Left Complete Independence  TRANSFERS: Assistive device utilized: None  Sit to stand: Complete Independence Stand to sit: Complete Independence Chair to chair: Complete Independence  GAIT: Gait pattern: decreased hip/knee flexion- Left and genu recurvatum- Left Distance walked: various clinic distances Assistive device utilized: None Level of assistance: SBA Comments: Pt has one instance of left LE buckling stating his hip was catching and hurting.  FUNCTIONAL TESTS:  5 times sit to stand: 6.72 seconds no UE  support, intermittent touch of back of legs to chair, no LOB 6 minute walk test: To be assessed. Functional gait assessment: To be assessed.  PATIENT SURVEYS:  FOTO not captured at intake.  TODAY'S TREATMENT:                                                                                                                              -Elliptical x2 minutes forward then 2 minutes backwards for dynamic cardiovascular warmup, pt has some moderate left hip pain with forward motion so did not continue beyond 4 minutes.  Discussed benefit of low impact activities to maintain mobility without endangering left hip joint integrity and to discuss this further with ortho MD managing this aspect of his care.  Did caution him against heavy plyometric style/jumping and landing tasks and running due to impact on LE joints.  -Lateral bounding (slowed pace) left and right 3x25 ft each direction, CGA-SBA, good motor control and coordination noted -Resisted walking in grass x300 ft > multidirectional perturbations SBA x300 ft -Foam beam w/ bimanual pipe carry w/ bilateral 3lb weights for perturbation challenge 8 x10 ft, intermittent CGA and touch to bars -Standing in tandem on airex using pipe and bilateral 3lb weights to perform tilted raise and lower x5 each w/ each foot in rear  PATIENT EDUCATION: Education details: Continue HEP, walking regularly.  Discussed remaining 2  visits and goals of POC with discharge at last visit. Person educated: Patient Education method: Explanation Education comprehension: verbalized understanding  HOME EXERCISE PROGRAM: Access Code: [email or web address] URL: [email or web address] Date: 12/15/2022 Prepared by: Peter Congo  Exercises - Staggered Sit-to-Stand  - 1 x daily - 7 x weekly - 3 sets - 10 reps - Standing Terminal Knee Extension with Resistance  - 1 x daily - 7 x weekly - 3 sets - 10 reps - Staggered Stance Squat  - 1 x daily - 7 x weekly - 3 sets - 10 reps - Forward Step Down with Heel Tap and Counter Support  - 1 x daily - 7 x weekly - 3 sets - 10 reps -Use sink for UE support and practice single leg squats and SLS progressing away from UE support as able over time/repetition   GOALS:  NEW GOALS:  Goals reviewed with patient? Yes  SHORT TERM GOALS:   Target date: 03/11/2023  Pt will ambulate >/=1200 feet on to demonstrate improved functional endurance for home and community participation. Baseline: 964 ft SBA-CGA (7/24); 1190 feet (8/21) Goal status: IN PROGRESS  LONG TERM GOALS:  Target date: 04/08/2023  Pt will be independent and compliant with advanced strength and balance HEP in order to maintain functional progress and improve mobility for return to work as able. Baseline: To be advanced. Goal status: INITIAL  2.  Pt will improve FGA score to >/=26/30 in order to demonstrate improved balance and decreased fall risk. Baseline: 23/30 (7/24) Goal status: INITIAL  3.  Pt will ambulate >/=500 feet  with LRAD over unlevel indoor and outdoor surfaces demonstrating improved left knee control at no more than modI level of assist to promote household and community access. Baseline: level surfaces 964 ft no AD SBA-CGA 3 instances of right toe catch (7/24) Goal status: INITIAL  4.  Patient will carry >/=20 lbs for at least 250 feet in order to demonstrate improved capability to return to work  duties. Baseline: To be assessed. Goal status: INITIAL  ASSESSMENT:  CLINICAL IMPRESSION: Patient has great difficulty due to hip pain during elliptical task this visit.  He continues to do best with slower movements due to ongoing hip MSK issues.  His coordination and motor control of the left hemibody does appear to be improving and PT feels he is appropriate for discharge in coming visits.  Will continue to assess and address deficits outlined in ongoing PT POC to optimize patient independence and return to baseline as able.  OBJECTIVE IMPAIRMENTS: Abnormal gait, decreased activity tolerance, decreased balance, decreased coordination, decreased endurance, decreased knowledge of use of DME, difficulty walking, decreased strength, and impaired sensation.   ACTIVITY LIMITATIONS: carrying, lifting, squatting, and locomotion level  PARTICIPATION LIMITATIONS: driving, community activity, and occupation  PERSONAL FACTORS: Fitness, Past/current experiences, Profession, Sex, Transportation, and 1-2 comorbidities: Left hip AVN and right oligodendroglioma  are also affecting patient's functional outcome.   REHAB POTENTIAL: Excellent  CLINICAL DECISION MAKING: Evolving/moderate complexity  EVALUATION COMPLEXITY: Moderate  PLAN:  PT FREQUENCY: 1x/week + 1x/week  PT DURATION: 8 weeks + 8 weeks  PLANNED INTERVENTIONS: Therapeutic exercises, Therapeutic activity, Neuromuscular re-education, Balance training, Gait training, Patient/Family education, Self Care, Stair training, Vestibular training, DME instructions, Manual therapy, and Re-evaluation  PLAN FOR NEXT SESSION: shoulder and farmer carry, tandem  Camille Bal, PT, DPT  03/30/2023, 11:46 AM

## 2023-04-06 ENCOUNTER — Ambulatory Visit: Payer: BC Managed Care – PPO | Admitting: Physical Therapy

## 2023-04-06 ENCOUNTER — Encounter: Payer: BC Managed Care – PPO | Admitting: Occupational Therapy

## 2023-04-06 ENCOUNTER — Encounter: Payer: Self-pay | Admitting: Physical Therapy

## 2023-04-06 VITALS — BP 130/79 | HR 87

## 2023-04-06 DIAGNOSIS — I69354 Hemiplegia and hemiparesis following cerebral infarction affecting left non-dominant side: Secondary | ICD-10-CM

## 2023-04-06 DIAGNOSIS — R278 Other lack of coordination: Secondary | ICD-10-CM | POA: Diagnosis not present

## 2023-04-06 DIAGNOSIS — R2689 Other abnormalities of gait and mobility: Secondary | ICD-10-CM

## 2023-04-06 DIAGNOSIS — R208 Other disturbances of skin sensation: Secondary | ICD-10-CM

## 2023-04-06 DIAGNOSIS — M6281 Muscle weakness (generalized): Secondary | ICD-10-CM

## 2023-04-06 NOTE — Therapy (Signed)
OUTPATIENT PHYSICAL THERAPY NEURO TREATMENT   Patient Name: Corey Chang MRN: [id number] DOB:[birth year], 42 y.o., male Today's Date: 04/06/2023  PCP: Eden Emms, MD REFERRING PROVIDER: Charlton Amor, PA-C  END OF SESSION:  PT End of Session - 04/06/23 1116     Visit Number 16    Number of Visits 17   9 + 8 at re-cert   Date for PT Re-Evaluation 04/22/23   pushed out to accomodate scheduling w/ OT   Authorization Type BLUE CROSS BLUE SHIELD    PT Start Time 1104    PT Stop Time 1147    PT Time Calculation (min) 43 min    Equipment Utilized During Treatment Gait belt    Activity Tolerance Patient tolerated treatment well    Behavior During Therapy [facility] for tasks assessed/performed            Past Medical History:  Diagnosis Date   Allergic rhinitis 04/22/1997   Ankle fracture, right 07/2007   Brain cancer (HCC)    Cholelithiasis    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  He had external beam radiation in August 2001 with 54 Gy in 30 fractions on RTOG 9802 protocol.   He declined chemotherapy.    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   Hyponatremia 11/24/2022   Acute CVA (cerebrovascular accident) (HCC) 11/23/2022   Polycythemia 11/23/2022   Elevated LFTs 04/12/2022   Hyperlipidemia 04/12/2022   Acute cough 04/12/2022   Syncope 04/03/2022   Elevated troponin 04/03/2022   Avascular necrosis of bone of hip, left (HCC) 04/03/2022   Allergic rhinitis 04/03/2022   Eustachian tube dysfunction, left 10/06/2021   Bloody diarrhea 11/17/2017   History of  oligodendroglioma of brain 03/22/2017   Status post radiation therapy 03/22/2017   GERD (gastroesophageal reflux disease) 11/11/2016   Complex partial seizure (HCC) 12/21/2012   Oligodendroglioma (HCC) 10/14/2011   Seasonal allergies 07/08/2008   SMOKELESS TOBACCO ABUSE 08/02/2007   Intracranial tumor (HCC) 11/17/1999   Seizure disorder (HCC) 01/16/1997    ONSET DATE: 11/26/2022 (CVA)  REFERRING DIAG: I63.9 (ICD-10-CM) - Cerebral infarction, unspecified  THERAPY DIAG:  Other lack of coordination  Hemiplegia and hemiparesis following cerebral infarction affecting left non-dominant side (HCC)  Muscle weakness (generalized)  Other abnormalities of gait and mobility  Other disturbances of skin sensation  Rationale for Evaluation and Treatment: Rehabilitation  SUBJECTIVE:  SUBJECTIVE STATEMENT: Pt denies pain or recent falls.  He spoke to his old boss and with permission went to the fire department Saturday and used their gym (standard equipment - leg press, treadmill, free weights).  He is sick today from a post-nasal drip, denies fever or other symptoms aside from hoarseness.  He wants to proceed with PT.  Pt accompanied by: self  PERTINENT HISTORY: L hip AVN (ortho not planning to address unless significant limitations in function-followed by [facility]), ankle ORIF x3, oligodendroglioma s/p Postoperative radiotherapy (XRT), complex partial seizures  Right hand dominant male who was admitted to [facility] on 11/23/2022 with 1 day history of left-sided numbness with left lower extremity weakness followed by sudden onset of dizziness and diarrhea.  He was found to have acute infarct in medulla.  PAIN:  Are you having pain? No  PRECAUTIONS: Fall and Other: left knee cage ; near deaf in right  ear due to previous ear infection years ago per pt report  WEIGHT BEARING RESTRICTIONS: No  FALLS: Has patient fallen in last 6 months? 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 2. Unchanged nonenhancing right temporal lobe mass, previously reported to represent an oligodendroglioma.  COGNITION: Overall cognitive status: Within functional limits for tasks assessed   SENSATION: Light touch: Impaired ; left side feels more dull compared to right  COORDINATION: LE RAMS:  dysmetric Heel-to-shin:  limited by L Hip AVN  EDEMA:  None noted in BLE  MUSCLE TONE: None noted during functional assessment  POSTURE: No Significant postural limitations  LOWER EXTREMITY ROM:     Active  Right Eval Left Eval  Hip flexion Grossly WFL  Hip extension   Hip abduction   Hip adduction   Hip internal rotation   Hip external rotation   Knee flexion   Knee extension   Ankle dorsiflexion   Ankle plantarflexion    Ankle inversion    Ankle eversion     (Blank rows = not tested)  LOWER EXTREMITY MMT:    MMT Right Eval Left Eval  Hip flexion 5/5 4+/5  Hip extension    Hip abduction " "  Hip adduction " "  Hip internal rotation    Hip external rotation    Knee flexion " "  Knee extension " "  Ankle  dorsiflexion " "  Ankle plantarflexion    Ankle inversion    Ankle eversion    (Blank rows = not tested)  BED MOBILITY:  Sit to supine Complete Independence Supine to sit Complete Independence Rolling to Right Complete Independence Rolling to Left Complete Independence  TRANSFERS: Assistive device utilized: None  Sit to stand: Complete Independence Stand to sit: Complete Independence Chair to chair: Complete Independence  GAIT: Gait pattern: decreased hip/knee flexion- Left and genu recurvatum- Left Distance walked: various clinic  distances Assistive device utilized: None Level of assistance: SBA Comments: Pt has one instance of left LE buckling stating his hip was catching and hurting.  FUNCTIONAL TESTS:  5 times sit to stand: 6.72 seconds no UE support, intermittent touch of back of legs to chair, no LOB 6 minute walk test: To be assessed. Functional gait assessment: To be assessed.  PATIENT SURVEYS:  FOTO not captured at intake.  TODAY'S TREATMENT:                                                                                                                              -Y balance on firm x10 each LE progressed to unilateral UE support SBA > Y balance on airex x10 each LE BUE support SBA -Standing w/ RLE elevated on 6" step performing squats 2x10 w/ BUE support -Alternating lunges to blue dynadisc 2x10 BUE support -Rebounder w/ 2kg ball in normal stance x10 > semi-tandem 2x10 w/ each LE in rear > tandem CGA 2x10 each LE in rear, mild sway on LLE throughout task -10 lb farmer's carry x200 ft LUE > 10 lb shoulder carry (LUE) x200 ft  PATIENT EDUCATION: Education details: Continue HEP, walking regularly, gradual return to gym environment.  Plan for discharge next session. Person educated: Patient Education method: Explanation Education comprehension: verbalized understanding  HOME EXERCISE PROGRAM: Access Code: [email or web address] URL: [email or web address] Date:  12/15/2022 Prepared by: Peter Congo  Exercises - Staggered Sit-to-Stand  - 1 x daily - 7 x weekly - 3 sets - 10 reps - Standing Terminal Knee Extension with Resistance  - 1 x daily - 7 x weekly - 3 sets - 10 reps - Staggered Stance Squat  - 1 x daily - 7 x weekly - 3 sets - 10 reps - Forward Step Down with Heel Tap and Counter Support  - 1 x daily - 7 x weekly - 3 sets - 10 reps -Use sink for UE support and practice single leg squats and SLS progressing away from UE support as able over time/repetition   GOALS:  NEW GOALS:  Goals reviewed with patient? Yes  SHORT TERM GOALS:   Target date: 03/11/2023  Pt will ambulate >/=1200 feet on to demonstrate improved functional endurance for home and community participation. Baseline: 964 ft SBA-CGA (7/24); 1190 feet (8/21) Goal status: IN PROGRESS  LONG TERM GOALS:  Target date: 04/08/2023  Pt will be independent and compliant with advanced strength and balance HEP in order to maintain functional progress and improve mobility for return to work as able. Baseline: To be advanced. Goal status: INITIAL  2.  Pt will improve FGA score to >/=26/30 in order to demonstrate improved balance and decreased fall risk. Baseline: 23/30 (7/24) Goal status: INITIAL  3.  Pt will ambulate >/=500 feet with LRAD over unlevel indoor and outdoor surfaces demonstrating improved left knee control at no more than modI level of assist to promote household and community access. Baseline: level surfaces  964 ft no AD SBA-CGA 3 instances of right toe catch (7/24) Goal status: INITIAL  4.  Patient will carry >/=20 lbs for at least 250 feet in order to demonstrate improved capability to return to work duties. Baseline: To be assessed. Goal status: INITIAL  ASSESSMENT:  CLINICAL IMPRESSION: Patient limited by recent illness today, but tolerates all interventions without increased difficulty.  His overall gait mechanics appear much improved, but he does have  some residual left LE weakness noted in left focused SLS tasks today.  His static and dynamic balance have demonstrated great improvement and he is at a level where he is gradually returning to community based activity.  Will plan to discharge after LTG assessment at next visit.  OBJECTIVE IMPAIRMENTS: Abnormal gait, decreased activity tolerance, decreased balance, decreased coordination, decreased endurance, decreased knowledge of use of DME, difficulty walking, decreased strength, and impaired sensation.   ACTIVITY LIMITATIONS: carrying, lifting, squatting, and locomotion level  PARTICIPATION LIMITATIONS: driving, community activity, and occupation  PERSONAL FACTORS: Fitness, Past/current experiences, Profession, Sex, Transportation, and 1-2 comorbidities: Left hip AVN and right oligodendroglioma  are also affecting patient's functional outcome.   REHAB POTENTIAL: Excellent  CLINICAL DECISION MAKING: Evolving/moderate complexity  EVALUATION COMPLEXITY: Moderate  PLAN:  PT FREQUENCY: 1x/week + 1x/week  PT DURATION: 8 weeks + 8 weeks  PLANNED INTERVENTIONS: Therapeutic exercises, Therapeutic activity, Neuromuscular re-education, Balance training, Gait training, Patient/Family education, Self Care, Stair training, Vestibular training, DME instructions, Manual therapy, and Re-evaluation  PLAN FOR NEXT SESSION: ASSESS LTGs - discharge!  Camille Bal, PT, DPT  04/06/2023, 11:54 AM

## 2023-04-13 ENCOUNTER — Encounter: Payer: BC Managed Care – PPO | Admitting: Occupational Therapy

## 2023-04-13 ENCOUNTER — Telehealth: Payer: Self-pay | Admitting: Physical Therapy

## 2023-04-13 ENCOUNTER — Ambulatory Visit: Payer: BC Managed Care – PPO | Admitting: Physical Therapy

## 2023-04-13 NOTE — Telephone Encounter (Signed)
Patient spoke to front office requesting to be discharged without visit.  PT to discharge patient from outpatient setting today.  Camille Bal, PT, DPT

## 2023-04-19 ENCOUNTER — Encounter
Payer: BC Managed Care – PPO | Attending: Physical Medicine & Rehabilitation | Admitting: Physical Medicine & Rehabilitation

## 2023-04-19 ENCOUNTER — Other Ambulatory Visit: Payer: Self-pay | Admitting: Nurse Practitioner

## 2023-04-19 ENCOUNTER — Other Ambulatory Visit (HOSPITAL_COMMUNITY): Payer: Self-pay

## 2023-04-19 ENCOUNTER — Encounter: Payer: Self-pay | Admitting: Physical Medicine & Rehabilitation

## 2023-04-19 VITALS — BP 129/88 | HR 63 | Ht 71.0 in | Wt 182.0 lb

## 2023-04-19 DIAGNOSIS — I69393 Ataxia following cerebral infarction: Secondary | ICD-10-CM | POA: Insufficient documentation

## 2023-04-19 DIAGNOSIS — I639 Cerebral infarction, unspecified: Secondary | ICD-10-CM | POA: Insufficient documentation

## 2023-04-19 DIAGNOSIS — R209 Unspecified disturbances of skin sensation: Secondary | ICD-10-CM | POA: Diagnosis not present

## 2023-04-19 DIAGNOSIS — I69398 Other sequelae of cerebral infarction: Secondary | ICD-10-CM | POA: Insufficient documentation

## 2023-04-19 DIAGNOSIS — K219 Gastro-esophageal reflux disease without esophagitis: Secondary | ICD-10-CM

## 2023-04-19 MED ORDER — PANTOPRAZOLE SODIUM 40 MG PO TBEC
40.0000 mg | DELAYED_RELEASE_TABLET | Freq: Every day | ORAL | 1 refills | Status: DC
  Filled 2023-04-19 – 2023-06-08 (×5): qty 90, 90d supply, fill #0

## 2023-04-19 MED ORDER — ATORVASTATIN CALCIUM 80 MG PO TABS
80.0000 mg | ORAL_TABLET | Freq: Every day | ORAL | 1 refills | Status: DC
Start: 2023-04-19 — End: 2023-06-24
  Filled 2023-04-19 – 2023-06-08 (×5): qty 90, 90d supply, fill #0

## 2023-04-19 NOTE — Progress Notes (Signed)
Subjective:  42 y.o. right-handed male with history of oligodendroglioma s/p XRT, complex partial seizures, AVN left hip who was admitted to [facility] on 11/23/2022 with 1 day history of left-sided numbness with left lower extremity weakness followed by sudden onset of dizziness and diarrhea.  He was found to have acute infarct in [place] as well as unchanged nonenhancing right temporal lobe mass.  Dr. Selina Cooley felt the stroke was due to small vessel disease and recommended DAPT x 3 weeks followed by aspirin alone.  She also recommended cardiac monitor as well as hypercoagulable panel due to young age and results are pending.  JAK2 analysis and erythropoietin levels were ordered for workup of polycythemia.  He continued to be limited by left-sided sensory deficits, left knee instability as well as decrease in fine motor movement of RUE.     Admit date: 11/26/2022 Discharge date: 12/03/2022  Patient ID: Corey Chang, male    DOB: [birth year], 42 y.o.   MRN: [id number]  HPI 42 year old male with history of oligodendroglioma s/p radiation therapy as well as chronic seizure disorder who was admitted to [facility] with a right medullary infarct.  He underwent inpatient rehabilitation for approximately 7 days and is now receiving outpatient therapy.  He has not returned to work yet.  He has not returned to driving yet.  He had a seizure in April 2024 and is under the care of Dr. Karel Jarvis for his seizure disorder.  He has an appointment with her and plans to follow-up regarding the question of driving. We discussed the patient's job activities which involved caring objects up to the 200 pounds up 3 steps. He also had to use his left hand to steady objects while cutting using his right hand.  He does have some scars from previous injuries even prior to his stroke. Left hip AVN, Ortho states there is no need for surgical intervention of his AVN  Pain Inventory Average Pain 0 Pain Right Now 0 My pain is  No  pain only  constant numbness  LOCATION OF PAIN  Numbness on the total left side face to feet  BOWEL Number of stools per week: 7-8 Oral laxative use No    BLADDER Normal    Mobility ability to climb steps?  yes do you drive?  no use a wheelchair Do you have any goals in this area?  yes  Function what is your job? On short term disability  Do you have any goals in this area?  yes  Neuro/Psych weakness numbness tingling spasms  Prior Studies Any changes since last visit?  no  Physicians involved in your care Any changes since last visit?  no   Family History  Problem Relation Age of Onset   Alzheimer's disease Mother    Diabetes Father    Heart disease Maternal Grandfather        MI   Parkinsonism Paternal Grandmother    Alzheimer's disease Paternal Grandmother    Depression Neg Hx    Drug abuse Neg Hx    Alcohol abuse Neg Hx    Cancer Neg Hx        no colon, prostate, breast, uterine,ovarian   Social History   Socioeconomic History   Marital status: Married    Spouse name: Corey Chang   Number of children: 2   Years of education: college   Highest education level: Associate degree: occupational, Scientist, product/process development, or vocational program  Occupational History   Occupation: sign erector    Comment: [employer]  for [employer]; 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[place], [place]    Employer: [employer]  Tobacco Use   Smoking status: Former    Current packs/day: 0.00    Average packs/day: 0.5 packs/day for 15.0 years (7.5 ttl pk-yrs)    Types: Cigarettes    Start date: 07/20/1991    Quit date: 07/19/2006    Years since quitting: 16.7   Smokeless tobacco: Current    Types: Snuff   Tobacco comments:    Quit 2006  Dipping everyday  Vaping Use   Vaping status: Never Used  Substance and Sexual Activity   Alcohol use: Yes    Alcohol/week: 2.0 standard drinks of alcohol    Types: 2 Cans of beer per week    Comment: 2-3 beer daily   Drug use: No   Sexual activity: Yes    Birth  control/protection: Pill  Other Topics Concern   Not on file  Social History Narrative   10/06/21   From: the area   Living: with wife, Aundra Millet (2008) and children   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[employer]   Family: 2 children - Mollie (2011) and Armed forces logistics/support/administrative officer (2014)      Enjoys: shoot - hunting and shooting      Exercise: trying to get kids to do soccer and play with children in sports   Diet: whatever he wants      Safety   Seat belts: Yes  and occasionally does not   Guns: Yes and secure   Safe in relationships: Yes       Social Determinants of Health   Financial Resource Strain: Low Risk  (12/07/2022)   Overall Financial Resource Strain (CARDIA)    Difficulty of Paying Living Expenses: Not hard at all  Food Insecurity: No Food Insecurity (12/07/2022)   Hunger Vital Sign    Worried About Running Out of Food in the Last Year: Never true    Ran Out of Food in the Last Year: Never true  Transportation Needs: No Transportation Needs (12/07/2022)   PRAPARE - Administrator, Civil Service (Medical): No    Lack of Transportation (Non-Medical): No  Physical Activity: Sufficiently Active (12/07/2022)   Exercise Vital Sign    Days of Exercise per Week: 5 days    Minutes of Exercise per Session: 50 min  Stress: No Stress Concern Present (12/07/2022)   [employer] of Occupational Health - Occupational Stress Questionnaire    Feeling of Stress : Not at all  Social Connections: Socially Integrated (12/07/2022)   Social Connection and Isolation Panel [NHANES]    Frequency of Communication with Friends and Family: More than three times a week    Frequency of Social Gatherings with Friends and Family: Once a week    Attends Religious Services: More than 4 times per year    Active Member of [employer] or Organizations: Yes    Attends Engineer, structural: More than 4 times per year    Marital Status: Married   Past Surgical History:  Procedure Laterality Date   ANKLE  SURGERY     right, x 3   brain biopsy  1997, 2001   Past Medical History:  Diagnosis Date   Allergic rhinitis 04/22/1997   Ankle fracture, right 07/2007   Brain cancer (HCC)    Cholelithiasis    Oligodendroglioma (HCC) 2001   Left temporal; bx in 12/1999.  He had external beam radiation in August 2001 with 54 Gy in 30 fractions  on RTOG 9802 protocol.   He declined chemotherapy.    Seizure (HCC)    from history of oligodendroglioma   Stroke (HCC) 11/23/2022   Ht 5\' 11"  (1.803 m)   Wt 182 lb (82.6 kg)   BMI 25.38 kg/m   Opioid Risk Score:   Fall Risk Score:  `1  Depression screen [facility] 2/9     04/19/2023    9:50 AM 02/22/2023    9:22 AM 02/17/2023   10:36 AM 12/23/2022    2:15 PM 12/23/2022    1:00 PM 11/11/2016    9:37 AM  Depression screen PHQ 2/9  Decreased Interest 0 0 0 0 0 0  Down, Depressed, Hopeless 0 0 0 0 0 0  PHQ - 2 Score 0 0 0 0 0 0  Altered sleeping  0  0    Tired, decreased energy  0  0    Change in appetite  0  0    Feeling bad or failure about yourself   0  0    Trouble concentrating  0  0    Moving slowly or fidgety/restless  0  0    Suicidal thoughts  0  0    PHQ-9 Score  0  0    Difficult doing work/chores  Not difficult at all        Review of Systems  Neurological:  Positive for weakness and numbness.       Tingling, spasms  All other systems reviewed and are negative.      Objective:   Physical Exam General No acute distress Mood and affect appropriate Speech without dysarthria or aphasia. Motor strength is 5/5 in the deltoid by stress of grip on the right side and 4/5 in the deltoid by stress of grip on left side Mild dysmetria finger-nose-finger on the left side. Sensation reduced pinprick sensation on the left side in the upper extremity.  Intact proprioception and light touch. Patient also has decreased temperature sensation left upper extremity. Ambulates without assist device no evidence of toe drag her knees guilty.  Able to do tandem  gait.       Assessment & Plan:  1.  History of right medullary infarct with chronic left upper and lower extremity weakness and pain in sharp sensation. He is approximately 5 months post stroke.  While he still may have some improvement in some of his symptoms I do not think he will regain full functional abilities.  I like to see him back in 3 months.  At this point I do not foresee him going back to his previous job position.  To see whether he may be a [employer] candidate.  2.  Seizure disorder follow-up with neurology.  They will need to clear him for driving.

## 2023-04-20 ENCOUNTER — Encounter: Payer: BC Managed Care – PPO | Admitting: Occupational Therapy

## 2023-04-22 ENCOUNTER — Other Ambulatory Visit (HOSPITAL_COMMUNITY): Payer: Self-pay

## 2023-04-27 ENCOUNTER — Encounter: Payer: BC Managed Care – PPO | Admitting: Occupational Therapy

## 2023-05-18 ENCOUNTER — Other Ambulatory Visit: Payer: Self-pay | Admitting: Nurse Practitioner

## 2023-05-18 ENCOUNTER — Telehealth: Payer: Self-pay | Admitting: *Deleted

## 2023-05-18 ENCOUNTER — Other Ambulatory Visit: Payer: Self-pay | Admitting: Hematology and Oncology

## 2023-05-18 ENCOUNTER — Other Ambulatory Visit (HOSPITAL_COMMUNITY): Payer: Self-pay

## 2023-05-18 DIAGNOSIS — N529 Male erectile dysfunction, unspecified: Secondary | ICD-10-CM

## 2023-05-18 NOTE — Telephone Encounter (Signed)
Contacted patient at Dr. Maxine Glenn request regarding refill request received for Xarelto.  LVM asking him to contact office about the refill

## 2023-05-19 ENCOUNTER — Telehealth: Payer: Self-pay

## 2023-05-19 ENCOUNTER — Encounter (HOSPITAL_COMMUNITY): Payer: Self-pay

## 2023-05-19 ENCOUNTER — Other Ambulatory Visit (HOSPITAL_COMMUNITY): Payer: Self-pay

## 2023-05-19 ENCOUNTER — Encounter: Payer: Self-pay | Admitting: Nurse Practitioner

## 2023-05-19 ENCOUNTER — Other Ambulatory Visit: Payer: Self-pay

## 2023-05-19 MED ORDER — SILDENAFIL CITRATE 50 MG PO TABS
25.0000 mg | ORAL_TABLET | Freq: Every day | ORAL | 1 refills | Status: DC | PRN
  Filled 2023-05-19: qty 10, 10d supply, fill #0

## 2023-05-19 MED ORDER — RIVAROXABAN 10 MG PO TABS
10.0000 mg | ORAL_TABLET | Freq: Every day | ORAL | 0 refills | Status: DC
  Filled 2023-05-19: qty 30, 30d supply, fill #0

## 2023-05-19 MED ORDER — RIVAROXABAN 10 MG PO TABS: 10.0000 mg | ORAL_TABLET | Freq: Every day | ORAL | 1 refills | Status: DC

## 2023-05-19 NOTE — Telephone Encounter (Signed)
Called back. He will ask PCP to refill Xarelto. If PCP will not refill Xarelto he will call the office back for refill/ appt.

## 2023-05-19 NOTE — Telephone Encounter (Signed)
Called and left a message asking him to call the office back regarding Xarelto refill. Per prior discussion if Dr. Bertis Ruddy refills he needs a appt.

## 2023-05-20 ENCOUNTER — Other Ambulatory Visit: Payer: Self-pay

## 2023-05-23 ENCOUNTER — Other Ambulatory Visit (HOSPITAL_COMMUNITY): Payer: Self-pay

## 2023-06-09 ENCOUNTER — Other Ambulatory Visit: Payer: Self-pay

## 2023-06-09 ENCOUNTER — Other Ambulatory Visit (HOSPITAL_COMMUNITY): Payer: Self-pay

## 2023-06-24 ENCOUNTER — Other Ambulatory Visit: Payer: Self-pay | Admitting: Nurse Practitioner

## 2023-06-24 DIAGNOSIS — K219 Gastro-esophageal reflux disease without esophagitis: Secondary | ICD-10-CM

## 2023-06-24 DIAGNOSIS — I639 Cerebral infarction, unspecified: Secondary | ICD-10-CM

## 2023-06-28 ENCOUNTER — Telehealth: Payer: BC Managed Care – PPO | Admitting: Physician Assistant

## 2023-06-28 DIAGNOSIS — J019 Acute sinusitis, unspecified: Secondary | ICD-10-CM | POA: Diagnosis not present

## 2023-06-28 DIAGNOSIS — B9689 Other specified bacterial agents as the cause of diseases classified elsewhere: Secondary | ICD-10-CM

## 2023-06-28 MED ORDER — FLUTICASONE PROPIONATE 50 MCG/ACT NA SUSP
2.0000 | Freq: Every day | NASAL | 0 refills | Status: DC
Start: 2023-06-28 — End: 2023-06-28

## 2023-06-28 MED ORDER — AMOXICILLIN-POT CLAVULANATE 875-125 MG PO TABS
1.0000 | ORAL_TABLET | Freq: Two times a day (BID) | ORAL | 0 refills | Status: DC
Start: 2023-06-28 — End: 2023-10-11

## 2023-06-28 NOTE — Progress Notes (Signed)
Virtual Visit Consent   Corey Chang, you are scheduled for a virtual visit with a [facility] provider today. Just as with appointments in the office, your consent must be obtained to participate. Your consent will be active for this visit and any virtual visit you may have with one of our providers in the next 365 days. If you have a MyChart account, a copy of this consent can be sent to you electronically.  As this is a virtual visit, video technology does not allow for your provider to perform a traditional examination. This may limit your provider's ability to fully assess your condition. If your provider identifies any concerns that need to be evaluated in person or the need to arrange testing (such as labs, EKG, etc.), we will make arrangements to do so. Although advances in technology are sophisticated, we cannot ensure that it will always work on either your end or our end. If the connection with a video visit is poor, the visit may have to be switched to a telephone visit. With either a video or telephone visit, we are not always able to ensure that we have a secure connection.  By engaging in this virtual visit, you consent to the provision of healthcare and authorize for your insurance to be billed (if applicable) for the services provided during this visit. Depending on your insurance coverage, you may receive a charge related to this service.  I need to obtain your verbal consent now. Are you willing to proceed with your visit today? Corey Chang has provided verbal consent on 06/28/2023 for a virtual visit (video or telephone). Corey Chang, [place]  Date: 06/28/2023 1:50 PM  Virtual Visit via Video Note   I, Corey Chang, connected with  Corey Chang  ([id number], 01-06-1981) on 06/28/23 at  1:45 PM EST by a video-enabled telemedicine application and verified that I am speaking with the correct person using two identifiers.  Location: Patient: Virtual Visit  Location Patient: Home Provider: Virtual Visit Location Provider: Home Office   I discussed the limitations of evaluation and management by telemedicine and the availability of in person appointments. The patient expressed understanding and agreed to proceed.    History of Present Illness: Corey Chang is a 42 y.o. who identifies as a male who was assigned male at birth, and is being seen today for > 1 week of URI symptoms that have progressively worsened since onset. Now with thick green nasal discharge, sinus pressure, sinus pain, ear pressure. Occasionally with some bloody nasal discharge.   OTC -- Sudafed and Mucinex.   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   Factor V Leiden mutation (HCC) 01/24/2023   Homocysteinemia 01/24/2023   Brainstem infarction (HCC) 11/26/2022   Hyponatremia 11/24/2022   Acute CVA (cerebrovascular accident) (HCC) 11/23/2022   Polycythemia 11/23/2022   Elevated LFTs 04/12/2022   Hyperlipidemia 04/12/2022   Acute cough 04/12/2022   Syncope 04/03/2022   Elevated troponin 04/03/2022   Avascular necrosis of bone of hip, left (HCC) 04/03/2022   Allergic rhinitis 04/03/2022   Eustachian tube dysfunction, left 10/06/2021   Bloody diarrhea 11/17/2017   History of oligodendroglioma of brain 03/22/2017   Status post radiation therapy 03/22/2017   GERD (gastroesophageal reflux disease) 11/11/2016   Complex partial seizure (HCC) 12/21/2012   Oligodendroglioma (HCC)  10/14/2011   Seasonal allergies 07/08/2008   SMOKELESS TOBACCO ABUSE 08/02/2007   Intracranial tumor (HCC) 11/17/1999   Seizure disorder (HCC) 01/16/1997    Allergies:  Allergies  Allergen Reactions   Carbatrol [Carbamazepine]     Not sure but may have caused a rash   Sulfa Antibiotics Rash   Medications:  Current Outpatient  Medications:    amoxicillin-clavulanate (AUGMENTIN) 875-125 MG tablet, Take 1 tablet by mouth 2 (two) times daily., Disp: 14 tablet, Rfl: 0   acetaminophen (TYLENOL) 325 MG tablet, Take 1-2 tablets (325-650 mg total) by mouth every 4 (four) hours as needed for mild pain., Disp: , Rfl:    albuterol (VENTOLIN HFA) 108 (90 Base) MCG/ACT inhaler, Inhale 2 puffs into the lungs every 6 (six) hours as needed for wheezing or shortness of breath., Disp: 8 g, Rfl: 2   aspirin EC 81 MG tablet, Take 1 tablet (81 mg total) by mouth daily. Swallow whole., Disp: 240 tablet, Rfl: 0   atorvastatin (LIPITOR) 80 MG tablet, TAKE 1 TABLET BY MOUTH EVERY DAY, Disp: 90 tablet, Rfl: 1   folic acid (FOLVITE) 1 MG tablet, Take 1 mg by mouth daily., Disp: , Rfl:    LamoTRIgine 200 MG TB24 24 hour tablet, Take 1 tablet (200 mg total) by mouth every evening., Disp: 90 tablet, Rfl: 3   levETIRAcetam (KEPPRA) 500 MG tablet, Take 3 tablets (1,500 mg total) by mouth 2 (two) times daily., Disp: 540 tablet, Rfl: 3   loratadine (CLARITIN) 10 MG tablet, Take 10 mg by mouth daily., Disp: , Rfl:    pantoprazole (PROTONIX) 40 MG tablet, TAKE 1 TABLET BY MOUTH EVERY DAY, Disp: 90 tablet, Rfl: 1   rivaroxaban (XARELTO) 10 MG TABS tablet, Take 1 tablet (10 mg total) by mouth daily. No further future refills until patient contact the office, Disp: 90 tablet, Rfl: 1   sildenafil (VIAGRA) 50 MG tablet, Take 0.5-1 tablets (25-50 mg total) by mouth daily as needed for erectile dysfunction., Disp: 10 tablet, Rfl: 1  Observations/Objective: Patient is well-developed, well-nourished in no acute distress.  Resting comfortably at home.  Head is normocephalic, atraumatic.  No labored breathing. Speech is clear and coherent with logical content.  Patient is alert and oriented at baseline.   Assessment and Plan: 1. Acute bacterial sinusitis - amoxicillin-clavulanate (AUGMENTIN) 875-125 MG tablet; Take 1 tablet by mouth 2 (two) times daily.   Dispense: 14 tablet; Refill: 0  Rx Augmentin.  Increase fluids.  Rest.  Saline nasal spray.  Probiotic.  Mucinex as directed.  Humidifier in bedroom. Flonase per orders.  Call or return to clinic if symptoms are not improving.   Follow Up Instructions: I discussed the assessment and treatment plan with the patient. The patient was provided an opportunity to ask questions and all were answered. The patient agreed with the plan and demonstrated an understanding of the instructions.  A copy of instructions were sent to the patient via MyChart unless otherwise noted below.    The patient was advised to call back or seek an in-person evaluation if the symptoms worsen or if the condition fails to improve as anticipated.    Corey Climes, PA-C

## 2023-06-28 NOTE — Patient Instructions (Signed)
Corey Chang, thank you for joining [name], PA-C for today's virtual visit.  While this provider is not your primary care provider (PCP), if your PCP is located in our provider database this encounter information will be shared with them immediately following your visit.   A [facility] MyChart account gives you access to today's visit and all your visits, tests, and labs performed at [facility] " click here if you don't have a [facility] MyChart account or go to [email or web address]  Consent: (Patient) Corey Chang provided verbal consent for this virtual visit at the beginning of the encounter.  Current Medications:  Current Outpatient Medications:    acetaminophen (TYLENOL) 325 MG tablet, Take 1-2 tablets (325-650 mg total) by mouth every 4 (four) hours as needed for mild pain., Disp: , Rfl:    albuterol (VENTOLIN HFA) 108 (90 Base) MCG/ACT inhaler, Inhale 2 puffs into the lungs every 6 (six) hours as needed for wheezing or shortness of breath., Disp: 8 g, Rfl: 2   aspirin EC 81 MG tablet, Take 1 tablet (81 mg total) by mouth daily. Swallow whole., Disp: 240 tablet, Rfl: 0   atorvastatin (LIPITOR) 80 MG tablet, TAKE 1 TABLET BY MOUTH EVERY DAY, Disp: 90 tablet, Rfl: 1   folic acid (FOLVITE) 1 MG tablet, Take 1 mg by mouth daily., Disp: , Rfl:    LamoTRIgine 200 MG TB24 24 hour tablet, Take 1 tablet (200 mg total) by mouth every evening., Disp: 90 tablet, Rfl: 3   levETIRAcetam (KEPPRA) 500 MG tablet, Take 3 tablets (1,500 mg total) by mouth 2 (two) times daily., Disp: 540 tablet, Rfl: 3   loratadine (CLARITIN) 10 MG tablet, Take 10 mg by mouth daily., Disp: , Rfl:    pantoprazole (PROTONIX) 40 MG tablet, TAKE 1 TABLET BY MOUTH EVERY DAY, Disp: 90 tablet, Rfl: 1   rivaroxaban (XARELTO) 10 MG TABS tablet, Take 1 tablet (10 mg total) by mouth daily. No further future refills until patient contact the office, Disp: 90 tablet, Rfl: 1   sildenafil (VIAGRA)  50 MG tablet, Take 0.5-1 tablets (25-50 mg total) by mouth daily as needed for erectile dysfunction., Disp: 10 tablet, Rfl: 1   Medications ordered in this encounter:  No orders of the defined types were placed in this encounter.    *If you need refills on other medications prior to your next appointment, please contact your pharmacy*  Follow-Up: Call back or seek an in-person evaluation if the symptoms worsen or if the condition fails to improve as anticipated.  [facility] [phone]  Other Instructions Please take antibiotic as directed.  Increase fluid intake.  Use Saline nasal spray.  Take a daily multivitamin. Use your Flonase as directed. I recommend plain Mucinex.  Place a humidifier in the bedroom.  Please call or return clinic if symptoms are not improving.  Sinusitis Sinusitis is redness, soreness, and swelling (inflammation) of the paranasal sinuses. Paranasal sinuses are air pockets within the bones of your face (beneath the eyes, the middle of the forehead, or above the eyes). In healthy paranasal sinuses, mucus is able to drain out, and air is able to circulate through them by way of your nose. However, when your paranasal sinuses are inflamed, mucus and air can become trapped. This can allow bacteria and other germs to grow and cause infection. Sinusitis can develop quickly and last only a short time (acute) or continue over a long period (chronic). Sinusitis that lasts for more  than 12 weeks is considered chronic.  CAUSES  Causes of sinusitis include: Allergies. Structural abnormalities, such as displacement of the cartilage that separates your nostrils (deviated septum), which can decrease the air flow through your nose and sinuses and affect sinus drainage. Functional abnormalities, such as when the small hairs (cilia) that line your sinuses and help remove mucus do not work properly or are not present. SYMPTOMS  Symptoms of acute and chronic sinusitis are  the same. The primary symptoms are pain and pressure around the affected sinuses. Other symptoms include: Upper toothache. Earache. Headache. Bad breath. Decreased sense of smell and taste. A cough, which worsens when you are lying flat. Fatigue. Fever. Thick drainage from your nose, which often is green and may contain pus (purulent). Swelling and warmth over the affected sinuses. DIAGNOSIS  Your caregiver will perform a physical exam. During the exam, your caregiver may: Look in your nose for signs of abnormal growths in your nostrils (nasal polyps). Tap over the affected sinus to check for signs of infection. View the inside of your sinuses (endoscopy) with a special imaging device with a light attached (endoscope), which is inserted into your sinuses. If your caregiver suspects that you have chronic sinusitis, one or more of the following tests may be recommended: Allergy tests. Nasal culture A sample of mucus is taken from your nose and sent to a lab and screened for bacteria. Nasal cytology A sample of mucus is taken from your nose and examined by your caregiver to determine if your sinusitis is related to an allergy. TREATMENT  Most cases of acute sinusitis are related to a viral infection and will resolve on their own within 10 days. Sometimes medicines are prescribed to help relieve symptoms (pain medicine, decongestants, nasal steroid sprays, or saline sprays).  However, for sinusitis related to a bacterial infection, your caregiver will prescribe antibiotic medicines. These are medicines that will help kill the bacteria causing the infection.  Rarely, sinusitis is caused by a fungal infection. In theses cases, your caregiver will prescribe antifungal medicine. For some cases of chronic sinusitis, surgery is needed. Generally, these are cases in which sinusitis recurs more than 3 times per year, despite other treatments. HOME CARE INSTRUCTIONS  Drink plenty of water. Water helps  thin the mucus so your sinuses can drain more easily. Use a humidifier. Inhale steam 3 to 4 times a day (for example, sit in the bathroom with the shower running). Apply a warm, moist washcloth to your face 3 to 4 times a day, or as directed by your caregiver. Use saline nasal sprays to help moisten and clean your sinuses. Take over-the-counter or prescription medicines for pain, discomfort, or fever only as directed by your caregiver. SEEK IMMEDIATE MEDICAL CARE IF: You have increasing pain or severe headaches. You have nausea, vomiting, or drowsiness. You have swelling around your face. You have vision problems. You have a stiff neck. You have difficulty breathing. MAKE SURE YOU:  Understand these instructions. Will watch your condition. Will get help right away if you are not doing well or get worse. Document Released: 07/05/2005 Document Revised: 09/27/2011 Document Reviewed: 07/20/2011 [facility] Patient Information 2014 [place], [place].    If you have been instructed to have an in-person evaluation today at a local Urgent Care facility, please use the link below. It will take you to a list of all of our available [facility], including address, phone number and hours of operation. Please do not delay care.  [facility]  If you or a family member do not have a primary care provider, use the link below to schedule a visit and establish care. When you choose a [facility] primary care physician or advanced practice provider, you gain a long-term partner in health. Find a Primary Care Provider  Learn more about [facility]'s in-office and virtual care options: [facility] - Get Care Now

## 2023-06-29 ENCOUNTER — Ambulatory Visit: Payer: BC Managed Care – PPO

## 2023-07-18 ENCOUNTER — Other Ambulatory Visit (HOSPITAL_COMMUNITY): Payer: Self-pay

## 2023-07-22 ENCOUNTER — Encounter: Payer: BC Managed Care – PPO | Admitting: Physical Medicine & Rehabilitation

## 2023-07-28 ENCOUNTER — Encounter: Payer: 59 | Attending: Physical Medicine & Rehabilitation | Admitting: Physical Medicine & Rehabilitation

## 2023-07-28 ENCOUNTER — Other Ambulatory Visit: Payer: Self-pay | Admitting: Physical Medicine and Rehabilitation

## 2023-07-28 ENCOUNTER — Encounter: Payer: Self-pay | Admitting: Physical Medicine & Rehabilitation

## 2023-07-28 VITALS — BP 116/83 | HR 76 | Ht 71.0 in | Wt 184.0 lb

## 2023-07-28 DIAGNOSIS — I639 Cerebral infarction, unspecified: Secondary | ICD-10-CM | POA: Insufficient documentation

## 2023-07-28 NOTE — Progress Notes (Signed)
 Subjective:    Patient ID: Corey Chang, male    DOB: 1980/09/25, 43 y.o.   MRN: [id number] 43 y.o. right-handed male with history of oligodendroglioma s/p XRT, complex partial seizures, AVN left hip who was admitted to [facility] on 11/23/2022 with 1 day history of left-sided numbness with left lower extremity weakness followed by sudden onset of dizziness and diarrhea.  He was found to have acute infarct in [place] as well as unchanged nonenhancing right temporal lobe mass.  Dr. Matthews felt the stroke was due to small vessel disease and recommended DAPT x 3 weeks followed by aspirin  alone.  She also recommended cardiac monitor as well as hypercoagulable panel due to young age and results are pending.  JAK2 analysis and erythropoietin  levels were ordered for workup of polycythemia.  He continued to be limited by left-sided sensory deficits, left knee instability as well as decrease in fine motor movement of RUE.     Admit date: 11/26/2022 HPI  Patient has completed outpatient PT OT.  Still notices spasticity in the left upper extremity left lower limb mainly with position changes.  Has occasional shoulder pain but he thinks this is more less due to his sleeping position. He is independent with all self-care and mobility. We discussed his job requirements including 100 pound lifting and requirement to go up and down a 20 foot ladder.  We discussed that I do not anticipate he will ever be able to do this again. 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 General activity 0 Relation with others 0 Enjoyment of life 0 What TIME of day is your pain at its worst? morning  Sleep (in general) Good  Pain is worse with: some activites Pain improves with: rest Relief from Meds:  not taking  medications at this time   Family History  Problem Relation Age of Onset   Alzheimer's disease Mother    Diabetes Father    Heart disease Maternal Grandfather        MI   Parkinsonism Paternal Grandmother    Alzheimer's disease Paternal Grandmother    Depression Neg Hx    Drug abuse Neg Hx    Alcohol abuse Neg Hx    Cancer Neg Hx        no colon, prostate, breast, uterine,ovarian   Social History   Socioeconomic History   Marital status: Married    Spouse name: Magazine Features Editor   Number of children: 2   Years of education: college   Highest education level: Associate degree: occupational, scientist, product/process development, or vocational program  Occupational History   Occupation: sign erector    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[employer]; 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[place], [place]    Employer: [employer]  Tobacco Use   Smoking status: Former    Current packs/day: 0.00    Average packs/day: 0.5 packs/day for 15.0 years (7.5 ttl pk-yrs)    Types: Cigarettes    Start date: 07/20/1991    Quit date: 07/19/2006    Years since quitting: 17.0   Smokeless tobacco: Current    Types: Snuff   Tobacco comments:    Quit 2006  Dipping everyday  Vaping Use   Vaping status: Never Used  Substance and Sexual Activity   Alcohol use: Yes  Alcohol/week: 2.0 standard drinks of alcohol    Types: 2 Cans of beer per week    Comment: 2-3 beer daily   Drug use: No   Sexual activity: Yes    Birth control/protection: Pill  Other Topics Concern   Not on file  Social History Narrative   10/06/21   From: the area   Living: with wife, Duwaine (2008) and children   Work: traffic public relations account executive with [employer]   [employer]   Family: 2 children - Mollie (2011) and Armed Forces Logistics/support/administrative Officer (2014)      Enjoys: shoot - hunting and shooting      Exercise: trying to get kids to do soccer and play with children in sports   Diet: whatever he wants      Safety   Seat belts: Yes  and occasionally does not   Guns: Yes and secure   Safe in relationships: Yes       Social Drivers of Manufacturing Engineer Strain: Low Risk  (12/07/2022)   Overall Financial Resource Strain (CARDIA)    Difficulty of Paying Living Expenses: Not hard at all  Food Insecurity: No Food Insecurity (12/07/2022)   Hunger Vital Sign    Worried About Running Out of Food in the Last Year: Never true    Ran Out of Food in the Last Year: Never true  Transportation Needs: No Transportation Needs (12/07/2022)   PRAPARE - Administrator, Civil Service (Medical): No    Lack of Transportation (Non-Medical): No  Physical Activity: Sufficiently Active (12/07/2022)   Exercise Vital Sign    Days of Exercise per Week: 5 days    Minutes of Exercise per Session: 50 min  Stress: No Stress Concern Present (12/07/2022)   [employer] of Occupational Health - Occupational Stress Questionnaire    Feeling of Stress : Not at all  Social Connections: Socially Integrated (12/07/2022)   Social Connection and Isolation Panel [NHANES]    Frequency of Communication with Friends and Family: More than three times a week    Frequency of Social Gatherings with Friends and Family: Once a week    Attends Religious Services: More than 4 times per year    Active Member of [employer] or Organizations: Yes    Attends Engineer, Structural: More than 4 times per year    Marital Status: Married   Past Surgical History:  Procedure Laterality Date   ANKLE SURGERY     right, x 3   brain biopsy  1997, 2001   Past Surgical History:  Procedure Laterality Date   ANKLE SURGERY     right, x 3   brain biopsy  1997, 2001   Past Medical History:  Diagnosis Date   Allergic rhinitis 04/22/1997   Ankle fracture, right 07/2007   Brain cancer (HCC)    Cholelithiasis    Oligodendroglioma (HCC) 2001   Left temporal; bx in 12/1999.  He had external beam radiation in August 2001 with 54 Gy in 30 fractions on RTOG 9802 protocol.   He declined chemotherapy.    Seizure (HCC)    from history of oligodendroglioma   Stroke (HCC)  11/23/2022   Ht 5' 11 (1.803 m)   Wt 184 lb (83.5 kg)   BMI 25.66 kg/m   Opioid Risk Score:   Fall Risk Score:  `1  Depression screen [facility] 2/9     07/28/2023   12:18 PM 04/19/2023    9:50 AM 02/22/2023    9:22  AM 02/17/2023   10:36 AM 12/23/2022    2:15 PM 12/23/2022    1:00 PM 11/11/2016    9:37 AM  Depression screen PHQ 2/9  Decreased Interest 0 0 0 0 0 0 0  Down, Depressed, Hopeless 0 0 0 0 0 0 0  PHQ - 2 Score 0 0 0 0 0 0 0  Altered sleeping   0  0    Tired, decreased energy   0  0    Change in appetite   0  0    Feeling bad or failure about yourself    0  0    Trouble concentrating   0  0    Moving slowly or fidgety/restless   0  0    Suicidal thoughts   0  0    PHQ-9 Score   0  0    Difficult doing work/chores   Not difficult at all          Review of Systems  Musculoskeletal:        Left shoulder pain   All other systems reviewed and are negative.     Objective:   Physical Exam General No acute distress Mood and affect appropriate. Extremities without edema Motor strength is 4+ at the left deltoid bicep tricep grip hip flexor knee extensor ankle dorsiflexor 5/5 on the right deltoid bicep tricep grip hip flexor knee extensor ankle dorsiflexor Ambulates without assistive device he is able to toe walk. He has mild antalgic gait favoring the right side. Speech without evidence of dysarthria or aphasia. Show Ataxia with runny nose finger testing as well as heel-to-shin testing left upper limb left lower limb      Assessment & Plan:   1.  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 Currently on aspirin  and Xarelto  follows up with his primary care as well as neurology. 2.  History of seizure disorder follows up with neurology for medications. As discussed with think the patient is permanently disabled from his current job but is able to do a sedentary job. I will see him back in 3  months

## 2023-08-01 ENCOUNTER — Other Ambulatory Visit (HOSPITAL_COMMUNITY): Payer: Self-pay

## 2023-08-03 ENCOUNTER — Other Ambulatory Visit (HOSPITAL_COMMUNITY): Payer: Self-pay

## 2023-09-27 ENCOUNTER — Other Ambulatory Visit: Payer: Self-pay | Admitting: Nurse Practitioner

## 2023-09-27 ENCOUNTER — Encounter: Payer: Self-pay | Admitting: Nurse Practitioner

## 2023-09-27 ENCOUNTER — Other Ambulatory Visit (HOSPITAL_COMMUNITY): Payer: Self-pay

## 2023-09-27 DIAGNOSIS — K219 Gastro-esophageal reflux disease without esophagitis: Secondary | ICD-10-CM

## 2023-09-27 DIAGNOSIS — I639 Cerebral infarction, unspecified: Secondary | ICD-10-CM

## 2023-10-11 ENCOUNTER — Ambulatory Visit: Payer: BC Managed Care – PPO | Admitting: Neurology

## 2023-10-11 ENCOUNTER — Encounter: Payer: Self-pay | Admitting: Neurology

## 2023-10-11 DIAGNOSIS — G40219 Localization-related (focal) (partial) symptomatic epilepsy and epileptic syndromes with complex partial seizures, intractable, without status epilepticus: Secondary | ICD-10-CM

## 2023-10-11 MED ORDER — LEVETIRACETAM 500 MG PO TABS: 1500.0000 mg | ORAL_TABLET | Freq: Two times a day (BID) | ORAL | 3 refills | Status: DC

## 2023-10-11 MED ORDER — LAMOTRIGINE ER 200 MG PO TB24: 200.0000 mg | ORAL_TABLET | Freq: Every evening | ORAL | 3 refills | Status: DC
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
## 2023-10-11 NOTE — Patient Instructions (Signed)
 Always good to see you. Continue Lamotrigine ER 200mg  every night and Levetiracetam 500mg : take 3 tablets twice a day. Follow-up in 6 months, call for any changes.    Seizure Precautions: 1. If medication has been prescribed for you to prevent seizures, take it exactly as directed.  Do not stop taking the medicine without talking to your doctor first, even if you have not had a seizure in a long time.   2. Avoid activities in which a seizure would cause danger to yourself or to others.  Don't operate dangerous machinery, swim alone, or climb in high or dangerous places, such as on ladders, roofs, or girders.  Do not drive unless your doctor says you may.  3. If you have any warning that you may have a seizure, lay down in a safe place where you can't hurt yourself.    4.  No driving for 6 months from last seizure, as per [facility].   Please refer to the following link on the Epilepsy Foundation of America's website for more information: http://www.epilepsyfoundation.org/answerplace/Social/driving/drivingu.cfm   5.  Maintain good sleep hygiene. Avoid alcohol.  6.  Contact your doctor if you have any problems that may be related to the medicine you are taking.  7.  Call 911 and bring the patient back to the ED if:        A.  The seizure lasts longer than 5 minutes.       B.  The patient doesn't awaken shortly after the seizure  C.  The patient has new problems such as difficulty seeing, speaking or moving  D.  The patient was injured during the seizure  E.  The patient has a temperature over 102 F (39C)  F.  The patient vomited and now is having trouble breathing

## 2023-10-27 ENCOUNTER — Encounter: Payer: Self-pay | Admitting: Physical Medicine & Rehabilitation

## 2023-10-27 ENCOUNTER — Encounter: Payer: 59 | Attending: Physical Medicine & Rehabilitation | Admitting: Physical Medicine & Rehabilitation

## 2023-10-27 VITALS — BP 133/86 | HR 74 | Ht 71.0 in | Wt 151.0 lb

## 2023-10-27 DIAGNOSIS — I69393 Ataxia following cerebral infarction: Secondary | ICD-10-CM | POA: Insufficient documentation

## 2023-10-27 DIAGNOSIS — G40909 Epilepsy, unspecified, not intractable, without status epilepticus: Secondary | ICD-10-CM | POA: Diagnosis present

## 2023-10-27 DIAGNOSIS — I639 Cerebral infarction, unspecified: Secondary | ICD-10-CM | POA: Insufficient documentation

## 2023-10-27 NOTE — Progress Notes (Signed)
 Subjective:    Patient ID: Corey Chang, male    DOB: [birth year], 43 y.o.   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  He completed inpatient rehabilitation as well as outpatient rehabilitation.  He has residual left upper extremity motor control issues due to persistent ataxia.  He has some balance issues as a result as well. No speech or swallowing issues. He had a very physical job requiring up to 100 pounds lifting which he no longer can do.  He is a IT sales professional.  He is on short-term disability and additional forms are filled out today.  He is able to do his dressing and bathing. Additional morbidities AVN left hip Seizure disorder on Keppra Pain Inventory Average Pain 0 Pain Right Now 0 My pain is constant, dull, and tingling, numb  LOCATION OF PAIN  Numbness, tingling on the left side of body  BOWEL Number of stools per week: 7 Oral laxative use No   BLADDER Normal    Mobility walk without assistance ability to climb steps?  yes do you drive?  yes Do you have any goals in this area?  yes  Function disabled: date disabled 2024  Neuro/Psych weakness numbness tingling trouble walking spasms  Prior Studies Any changes since last visit?  no  Physicians involved in your care Any changes since last visit?  yes   Family History  Problem Relation Age of Onset   Alzheimer's disease Mother    Diabetes Father    Heart disease Maternal Grandfather        MI   Parkinsonism Paternal Grandmother    Alzheimer's disease Paternal Grandmother    Depression Neg Hx    Drug abuse Neg Hx    Alcohol abuse Neg Hx    Cancer Neg Hx        no colon, prostate, breast, uterine,ovarian   Social History   Socioeconomic History   Marital status: Married    Spouse name: Magazine features editor   Number of children: 2   Years of education: college   Highest education level:  Associate degree: occupational, Scientist, product/process development, or vocational program  Occupational History   Occupation: sign erector    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[employer]; 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[place], [place]    Employer: [employer]  Tobacco Use   Smoking status: Former    Current packs/day: 0.00    Average packs/day: 0.5 packs/day for 15.0 years (7.5 ttl pk-yrs)    Types: Cigarettes    Start date: 07/20/1991    Quit date: 07/19/2006    Years since quitting: 17.2   Smokeless tobacco: Current    Types: Snuff   Tobacco comments:    Quit 2006  Dipping everyday  Vaping Use   Vaping status: Never Used  Substance and Sexual Activity   Alcohol use: Yes    Alcohol/week: 2.0 standard drinks of alcohol    Types: 2 Cans of beer per week    Comment: 2-3 beer daily   Drug use: No   Sexual activity: Yes    Birth control/protection: Pill  Other Topics Concern   Not on file  Social History Narrative   10/06/21   From: the area   Living: with wife, Aundra Millet (2008) and children   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[employer]   [employer]   Family: 2 children - Mollie (2011) and Armed forces logistics/support/administrative officer (2014)      Enjoys: shoot -  hunting and shooting      Exercise: trying to get kids to do soccer and play with children in sports   Diet: whatever he wants      Safety   Seat belts: Yes  and occasionally does not   Guns: Yes and secure   Safe in relationships: Yes       Social Drivers of Health   Financial Resource Strain: Low Risk  (12/07/2022)   Overall Financial Resource Strain (CARDIA)    Difficulty of Paying Living Expenses: Not hard at all  Food Insecurity: No Food Insecurity (12/07/2022)   Hunger Vital Sign    Worried About Running Out of Food in the Last Year: Never true    Ran Out of Food in the Last Year: Never true  Transportation Needs: No Transportation Needs (12/07/2022)   PRAPARE - Administrator, Civil Service (Medical): No    Lack of Transportation (Non-Medical): No  Physical Activity: Sufficiently Active (12/07/2022)    Exercise Vital Sign    Days of Exercise per Week: 5 days    Minutes of Exercise per Session: 50 min  Stress: No Stress Concern Present (12/07/2022)   [employer] of Occupational Health - Occupational Stress Questionnaire    Feeling of Stress : Not at all  Social Connections: Socially Integrated (12/07/2022)   Social Connection and Isolation Panel [NHANES]    Frequency of Communication with Friends and Family: More than three times a week    Frequency of Social Gatherings with Friends and Family: Once a week    Attends Religious Services: More than 4 times per year    Active Member of [employer] or Organizations: Yes    Attends Engineer, structural: More than 4 times per year    Marital Status: Married   Past Surgical History:  Procedure Laterality Date   ANKLE SURGERY     right, x 3   brain biopsy  1997, 2001   Past Medical History:  Diagnosis Date   Allergic rhinitis 04/22/1997   Ankle fracture, right 07/2007   Brain cancer (HCC)    Cholelithiasis    Oligodendroglioma (HCC) 2001   Left temporal; bx in 12/1999.  He had external beam radiation in August 2001 with 54 Gy in 30 fractions on RTOG 9802 protocol.   He declined chemotherapy.    Seizure (HCC)    from history of oligodendroglioma   Stroke (HCC) 11/23/2022   There were no vitals taken for this visit.  Opioid Risk Score:   Fall Risk Score:  `1  Depression screen [facility] 2/9     07/28/2023   12:18 PM 04/19/2023    9:50 AM 02/22/2023    9:22 AM 02/17/2023   10:36 AM 12/23/2022    2:15 PM 12/23/2022    1:00 PM 11/11/2016    9:37 AM  Depression screen PHQ 2/9  Decreased Interest 0 0 0 0 0 0 0  Down, Depressed, Hopeless 0 0 0 0 0 0 0  PHQ - 2 Score 0 0 0 0 0 0 0  Altered sleeping   0  0    Tired, decreased energy   0  0    Change in appetite   0  0    Feeling bad or failure about yourself    0  0    Trouble concentrating   0  0    Moving slowly or fidgety/restless   0  0    Suicidal thoughts   0  0    PHQ-9 Score    0  0    Difficult doing work/chores   Not difficult at all        Review of Systems  Musculoskeletal:  Positive for gait problem.       Spasms  Neurological:  Positive for weakness.       Tingling  All other systems reviewed and are negative.      Objective:   Physical Exam General No acute distress Mood and affect appropriate Extremities without edema Motor strength is 4+ in left deltoid bicep tricep grip 5/5 in the right deltoid biceps grip 5/5 bilateral lower extremity Positive dysmetria left finger-nose-finger and left heel-to-shin. Ambulates without assistive device no evidence toe drag and instability Speech without dysarthria or aphasia      Assessment & Plan:  1.  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  This has impacted on his ability to return to his job.  I have completed short-term disability forms.  Anticipate conversion to long-term. I will see him back next month.  Continued to advise home exercise program

## 2023-11-11 ENCOUNTER — Other Ambulatory Visit: Payer: Self-pay | Admitting: Nurse Practitioner

## 2023-11-11 NOTE — Telephone Encounter (Signed)
 Called  pt and schedule a appt for cpe / labs

## 2023-11-11 NOTE — Telephone Encounter (Signed)
 lvm for pt to call office to schedule appt.

## 2023-11-11 NOTE — Telephone Encounter (Signed)
 Patient is over due for CPE please schedule

## 2023-11-17 ENCOUNTER — Other Ambulatory Visit (INDEPENDENT_AMBULATORY_CARE_PROVIDER_SITE_OTHER): Payer: Self-pay | Admitting: Nurse Practitioner

## 2023-11-17 ENCOUNTER — Other Ambulatory Visit (INDEPENDENT_AMBULATORY_CARE_PROVIDER_SITE_OTHER)

## 2023-11-17 DIAGNOSIS — Z131 Encounter for screening for diabetes mellitus: Secondary | ICD-10-CM | POA: Diagnosis not present

## 2023-11-17 DIAGNOSIS — E785 Hyperlipidemia, unspecified: Secondary | ICD-10-CM | POA: Diagnosis not present

## 2023-11-17 DIAGNOSIS — Z8673 Personal history of transient ischemic attack (TIA), and cerebral infarction without residual deficits: Secondary | ICD-10-CM

## 2023-11-17 DIAGNOSIS — D751 Secondary polycythemia: Secondary | ICD-10-CM | POA: Diagnosis not present

## 2023-11-17 DIAGNOSIS — D6851 Activated protein C resistance: Secondary | ICD-10-CM

## 2023-11-17 DIAGNOSIS — I69393 Ataxia following cerebral infarction: Secondary | ICD-10-CM

## 2023-11-17 LAB — COMPREHENSIVE METABOLIC PANEL WITH GFR
ALT: 29 U/L (ref 0–53)
AST: 28 U/L (ref 0–37)
Albumin: 4.8 g/dL (ref 3.5–5.2)
Alkaline Phosphatase: 79 U/L (ref 39–117)
BUN: 8 mg/dL (ref 6–23)
CO2: 27 meq/L (ref 19–32)
Calcium: 9.9 mg/dL (ref 8.4–10.5)
Chloride: 102 meq/L (ref 96–112)
Creatinine, Ser: 0.84 mg/dL (ref 0.40–1.50)
GFR: 107.88 mL/min (ref >60.00)
Glucose, Bld: 82 mg/dL (ref 70–99)
Potassium: 3.7 meq/L (ref 3.5–5.1)
Sodium: 138 meq/L (ref 135–145)
Total Bilirubin: 0.5 mg/dL (ref 0.2–1.2)
Total Protein: 7 g/dL (ref 6.0–8.3)

## 2023-11-17 LAB — TSH: TSH: 3.85 u[IU]/mL (ref 0.35–5.50)

## 2023-11-17 LAB — LIPID PANEL
Cholesterol: 121 mg/dL (ref 0–200)
HDL: 35.7 mg/dL — ABNORMAL LOW (ref >39.00)
LDL Cholesterol: 23 mg/dL (ref 0–99)
NonHDL: 85.34
Total CHOL/HDL Ratio: 3
Triglycerides: 314 mg/dL — ABNORMAL HIGH (ref 0.0–149.0)
VLDL: 62.8 mg/dL — ABNORMAL HIGH (ref 0.0–40.0)

## 2023-11-17 LAB — HEMOGLOBIN A1C: Hgb A1c MFr Bld: 5.5 % (ref 4.6–6.5)

## 2023-11-17 LAB — CBC
HCT: 45.7 % (ref 39.0–52.0)
Hemoglobin: 15.5 g/dL (ref 13.0–17.0)
MCHC: 33.8 g/dL (ref 30.0–36.0)
MCV: 88 fl (ref 78.0–100.0)
Platelets: 220 10*3/uL (ref 150.0–400.0)
RBC: 5.19 Mil/uL (ref 4.22–5.81)
RDW: 13.3 % (ref 11.5–15.5)
WBC: 4.5 10*3/uL (ref 4.0–10.5)

## 2023-11-18 ENCOUNTER — Encounter: Payer: Self-pay | Admitting: Nurse Practitioner

## 2023-11-23 ENCOUNTER — Encounter: Payer: Self-pay | Admitting: Nurse Practitioner

## 2023-11-23 ENCOUNTER — Ambulatory Visit: Admitting: Nurse Practitioner

## 2023-11-23 VITALS — BP 128/88 | HR 81 | Temp 98.0°F | Ht 70.0 in | Wt 183.2 lb

## 2023-11-23 DIAGNOSIS — Z8673 Personal history of transient ischemic attack (TIA), and cerebral infarction without residual deficits: Secondary | ICD-10-CM

## 2023-11-23 DIAGNOSIS — Z0000 Encounter for general adult medical examination without abnormal findings: Secondary | ICD-10-CM | POA: Diagnosis not present

## 2023-11-23 DIAGNOSIS — D6851 Activated protein C resistance: Secondary | ICD-10-CM | POA: Diagnosis not present

## 2023-11-23 DIAGNOSIS — R209 Unspecified disturbances of skin sensation: Secondary | ICD-10-CM

## 2023-11-23 DIAGNOSIS — G40909 Epilepsy, unspecified, not intractable, without status epilepticus: Secondary | ICD-10-CM | POA: Diagnosis not present

## 2023-11-23 DIAGNOSIS — C712 Malignant neoplasm of temporal lobe: Secondary | ICD-10-CM | POA: Insufficient documentation

## 2023-11-23 DIAGNOSIS — I69398 Other sequelae of cerebral infarction: Secondary | ICD-10-CM

## 2023-11-23 MED ORDER — TADALAFIL 10 MG PO TABS: 10.0000 mg | ORAL_TABLET | ORAL | 1 refills | Status: AC | PRN

## 2023-11-23 NOTE — Assessment & Plan Note (Signed)
 History of the same patient currently on anticoagulation and high intensity statin.  Reviewed lipid panel with patient today

## 2023-11-23 NOTE — Assessment & Plan Note (Signed)
 History of the same is followed by neurology and gets MRIs.  Continue following with specialist as recommended

## 2023-11-23 NOTE — Assessment & Plan Note (Signed)
Discussed age-appropriate immunizations and screening exams.  Did review patient's personal, surgical, social, family histories.  Patient is up-to-date on all age-appropriate vaccinations he would like.  Patient is too young for CRC screening or prostate cancer screening.  Patient was given information at discharge about preventative healthcare maintenance with anticipatory guidance.

## 2023-11-23 NOTE — Assessment & Plan Note (Signed)
 History of left-sided altered sensation.  He has been followed by PMR and is going through short-term to long-term disability process.

## 2023-11-23 NOTE — Patient Instructions (Signed)
 Nice to see you today I want to see you in 1 year, sooner if you need me

## 2023-11-23 NOTE — Assessment & Plan Note (Signed)
 Patient currently followed by neurology he is on levetiracetam  and Lamictal .  Continue taking medication as prescribed continue following with specialist as recommended

## 2023-11-23 NOTE — Assessment & Plan Note (Signed)
 History of the same likely what caused the patient's stroke.  He has been seen by hematology patient will require lifelong anticoagulation.  Hematology request PCP to continue Xarelto  10 mg.  We are more than happy to do so

## 2023-11-23 NOTE — Progress Notes (Signed)
 Established Patient Office Visit  Subjective   Patient ID: Corey Chang, male    DOB: 05-Oct-1980  Age: 43 y.o. MRN: [id number]  Chief Complaint  Patient presents with   Annual Exam    HPI   CVA: hx of the same do to factor V. He is followed by PMR and Neruroogy. He is on anticoagulation  and high intensity statin.   GERD: he is on protonix  40mg  dialy. He does well with the med and does not have to change his diet   Seixure: he is on keppra  and lamitical and followed by neurology   Factor V: has been seen by heme and on xarleto 10 mg daily. Request primary to continue anticoagulation    for complete physical and follow up of chronic conditions.  Immunizations: -Tetanus: Completed in 2024 -Influenza: out of season  -Shingles: too young  -Pneumonia: too young   Diet: Fair diet. He is eating at least 3 meals a day and sometimes a snack. He drinks mostly water.  Exercise:  He goes to the gym and will weight lift he will do cardio also  Eye exam: PRN  Dental exam: Completes semi-annually    Colonoscopy: too young, currently average risk  Lung Cancer Screening: NA  PSA: too young, currently average risk   Sleep: goes to bed around 11 and get up around 8 and feels rested. Does not snore       Review of Systems  Constitutional:  Negative for chills and fever.  Respiratory:  Negative for shortness of breath.   Cardiovascular:  Negative for chest pain and leg swelling.  Gastrointestinal:  Negative for abdominal pain, blood in stool, constipation, diarrhea, nausea and vomiting.       BM daily   Genitourinary:  Negative for dysuria and hematuria.  Neurological:  Positive for tingling (left sided post stroke). Negative for dizziness and headaches.  Psychiatric/Behavioral:  Negative for hallucinations and suicidal ideas.       Objective:     BP 128/88   Pulse 81   Temp 98 F (36.7 C) (Oral)   Ht 5\' 10"  (1.778 m)   Wt 183 lb 3.2 oz (83.1 kg)   SpO2 98%   BMI  26.29 kg/m  BP Readings from Last 3 Encounters:  11/23/23 128/88  10/27/23 133/86  10/11/23 129/89   Wt Readings from Last 3 Encounters:  11/23/23 183 lb 3.2 oz (83.1 kg)  10/27/23 151 lb (68.5 kg)  10/11/23 183 lb 1.6 oz (83.1 kg)   SpO2 Readings from Last 3 Encounters:  11/23/23 98%  10/27/23 98%  10/11/23 100%      Physical Exam Vitals and nursing note reviewed.  Constitutional:      Appearance: Normal appearance.  HENT:     Right Ear: Tympanic membrane, ear canal and external ear normal.     Left Ear: Tympanic membrane, ear canal and external ear normal.     Mouth/Throat:     Mouth: Mucous membranes are moist.     Pharynx: Oropharynx is clear.  Eyes:     Extraocular Movements: Extraocular movements intact.     Pupils: Pupils are equal, round, and reactive to light.  Cardiovascular:     Rate and Rhythm: Normal rate and regular rhythm.     Pulses: Normal pulses.     Heart sounds: Normal heart sounds.  Pulmonary:     Effort: Pulmonary effort is normal.     Breath sounds: Normal breath sounds.  Abdominal:  General: Bowel sounds are normal. There is no distension.     Palpations: There is no mass.     Tenderness: There is no abdominal tenderness.     Hernia: No hernia is present.  Genitourinary:    Comments: deferred Musculoskeletal:     Right lower leg: No edema.     Left lower leg: No edema.  Lymphadenopathy:     Cervical: No cervical adenopathy.  Skin:    General: Skin is warm.  Neurological:     General: No focal deficit present.     Mental Status: He is alert.     Deep Tendon Reflexes:     Reflex Scores:      Bicep reflexes are 2+ on the right side and 2+ on the left side.      Patellar reflexes are 2+ on the right side and 2+ on the left side.    Comments: Bilateral upper and lower extremity strength 5/5  Psychiatric:        Mood and Affect: Mood normal.        Behavior: Behavior normal.        Thought Content: Thought content normal.         Judgment: Judgment normal.      No results found for any visits on 11/23/23.    The ASCVD Risk score (Arnett DK, et al., 2019) failed to calculate for the following reasons:   Risk score cannot be calculated because patient has a medical history suggesting prior/existing ASCVD    Assessment & Plan:   Problem List Items Addressed This Visit       Nervous and Auditory   Seizure disorder [facility])   Patient currently followed by neurology he is on levetiracetam  and Lamictal .  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  He has been followed by PMR and is going through short-term to long-term disability process.      Oligodendroglioma of temporal lobe [facility])   History of the same is followed by neurology and gets MRIs.  Continue following with specialist as recommended        Hematopoietic and Hemostatic   Factor V Leiden mutation (HCC)   History of the same likely what caused the patient's stroke.  He has been seen by hematology patient will require lifelong anticoagulation.  Hematology request PCP to continue Xarelto  10 mg.  We are more than happy to do so        Other   Preventative health care - Primary   Discussed age-appropriate immunizations and screening exams.  Did review patient's personal, surgical, social, family histories.  Patient is up-to-date on all age-appropriate vaccinations he would like.  Patient is too young for CRC screening or prostate cancer screening.  Patient was given information at discharge about preventative healthcare maintenance with anticipatory guidance      History of stroke   History of the same patient currently on anticoagulation and high intensity statin.  Reviewed lipid panel with patient today       Return in about 1 year (around 11/22/2024) for CPE and Labs.    Margarie Shay, NP

## 2023-11-24 ENCOUNTER — Encounter: Payer: Self-pay | Admitting: Physical Medicine & Rehabilitation

## 2023-11-24 ENCOUNTER — Encounter: Attending: Physical Medicine & Rehabilitation | Admitting: Physical Medicine & Rehabilitation

## 2023-11-24 VITALS — BP 122/81 | HR 73 | Ht 70.0 in | Wt 181.0 lb

## 2023-11-24 DIAGNOSIS — I69393 Ataxia following cerebral infarction: Secondary | ICD-10-CM | POA: Diagnosis not present

## 2023-11-24 DIAGNOSIS — I69398 Other sequelae of cerebral infarction: Secondary | ICD-10-CM | POA: Diagnosis present

## 2023-11-24 DIAGNOSIS — R209 Unspecified disturbances of skin sensation: Secondary | ICD-10-CM | POA: Diagnosis present

## 2023-11-24 DIAGNOSIS — I639 Cerebral infarction, unspecified: Secondary | ICD-10-CM | POA: Diagnosis present

## 2023-11-24 NOTE — Patient Instructions (Signed)
 Fax over insurance forms as needed

## 2023-11-24 NOTE — Progress Notes (Signed)
 Subjective:    Patient ID: Corey Chang, male    DOB: 21-May-1981, 43 y.o.   MRN: [id number]  HPI 43 year old male with prior history of oligodendroglioma s/p extensive radiation treatment with history of seizure disorder who also has a history of left hip avascular necrosis.  Had a right medullary infarct causing left hemiataxia onset 1 year ago. He has completed inpatient rehabilitation as well as outpatient rehabilitation.  He is at a modified independent level however he cannot return to work because of moderate ataxia affecting the left upper extremity.  He also has a balance disorder. Pain Inventory Average Pain 0 Pain Right Now 0 My pain is intermittent, constant, sharp, tingling, and numbness  In the last 24 hours, has pain interfered with the following? General activity 1 Relation with others 0 Enjoyment of life 0 What TIME of day is your pain at its worst? morning , daytime, evening, and night Sleep (in general) Good  Pain is worse with: walking, standing, and some activites Pain improves with: nothing  Relief from Meds: none  Family History  Problem Relation Age of Onset   Alzheimer's disease Mother    Diabetes Father    Heart disease Maternal Grandfather        MI   Parkinsonism Paternal Grandmother    Alzheimer's disease Paternal Grandmother    Depression Neg Hx    Drug abuse Neg Hx    Alcohol abuse Neg Hx    Cancer Neg Hx        no colon, prostate, breast, uterine,ovarian   Social History   Socioeconomic History   Marital status: Married    Spouse name: Magazine features editor   Number of children: 2   Years of education: college   Highest education level: Associate degree: occupational, Scientist, product/process development, or vocational program  Occupational History   Occupation: sign erector    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[employer]; 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[place], [place]    Employer: [employer]  Tobacco Use   Smoking status: Former    Current packs/day: 0.00    Average packs/day: 0.5 packs/day for 15.0  years (7.5 ttl pk-yrs)    Types: Cigarettes    Start date: 07/20/1991    Quit date: 07/19/2006    Years since quitting: 17.3   Smokeless tobacco: Current    Types: Snuff   Tobacco comments:    Quit 2006  Dipping everyday  Vaping Use   Vaping status: Never Used  Substance and Sexual Activity   Alcohol use: Yes    Alcohol/week: 8.0 standard drinks of alcohol    Types: 2 Cans of beer, 6 Standard drinks or equivalent per week    Comment: 2-3 beer daily   Drug use: No   Sexual activity: Yes    Birth control/protection: Pill  Other Topics Concern   Not on file  Social History Narrative   10/06/21   From: the area   Living: with wife, Jacqlyn Matas (2008) and children   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[employer]   [employer]   Family: 2 children - Mollie (2011) and Armed forces logistics/support/administrative officer (2014)      Enjoys: shoot - hunting and shooting      Exercise: trying to get kids to do soccer and play with children in sports   Diet: whatever he wants      Safety   Seat belts: Yes  and occasionally does not   Guns: Yes and secure   Safe in relationships: Yes       Social  Drivers of Health   Financial Resource Strain: Low Risk  (12/07/2022)   Overall Financial Resource Strain (CARDIA)    Difficulty of Paying Living Expenses: Not hard at all  Food Insecurity: No Food Insecurity (12/07/2022)   Hunger Vital Sign    Worried About Running Out of Food in the Last Year: Never true    Ran Out of Food in the Last Year: Never true  Transportation Needs: No Transportation Needs (12/07/2022)   PRAPARE - Administrator, Civil Service (Medical): No    Lack of Transportation (Non-Medical): No  Physical Activity: Sufficiently Active (12/07/2022)   Exercise Vital Sign    Days of Exercise per Week: 5 days    Minutes of Exercise per Session: 50 min  Stress: No Stress Concern Present (12/07/2022)   [employer] of Occupational Health - Occupational Stress Questionnaire    Feeling of Stress : Not at all  Social Connections:  Socially Integrated (12/07/2022)   Social Connection and Isolation Panel [NHANES]    Frequency of Communication with Friends and Family: More than three times a week    Frequency of Social Gatherings with Friends and Family: Once a week    Attends Religious Services: More than 4 times per year    Active Member of [employer] or Organizations: Yes    Attends Engineer, structural: More than 4 times per year    Marital Status: Married   Past Surgical History:  Procedure Laterality Date   ANKLE SURGERY     right, x 3   brain biopsy  1997, 2001   Past Surgical History:  Procedure Laterality Date   ANKLE SURGERY     right, x 3   brain biopsy  1997, 2001   Past Medical History:  Diagnosis Date   Allergic rhinitis 04/22/1997   Ankle fracture, right 07/2007   Brain cancer (HCC)    Cholelithiasis    Oligodendroglioma (HCC) 2001   Left temporal; bx in 12/1999.  He had external beam radiation in August 2001 with 54 Gy in 30 fractions on RTOG 9802 protocol.   He declined chemotherapy.    Seizure (HCC)    from history of oligodendroglioma   Stroke (HCC) 11/23/2022   There were no vitals taken for this visit.  Opioid Risk Score:   Fall Risk Score:  `1  Depression screen [facility] 2/9     11/23/2023    2:17 PM 10/27/2023   12:46 PM 07/28/2023   12:18 PM 04/19/2023    9:50 AM 02/22/2023    9:22 AM 02/17/2023   10:36 AM 12/23/2022    2:15 PM  Depression screen PHQ 2/9  Decreased Interest 0 0 0 0 0 0 0  Down, Depressed, Hopeless 0 0 0 0 0 0 0  PHQ - 2 Score 0 0 0 0 0 0 0  Altered sleeping 0    0  0  Tired, decreased energy 0    0  0  Change in appetite 0    0  0  Feeling bad or failure about yourself  0    0  0  Trouble concentrating 0    0  0  Moving slowly or fidgety/restless 0    0  0  Suicidal thoughts 0    0  0  PHQ-9 Score 0    0  0  Difficult doing work/chores Not difficult at all    Not difficult at all      Review of Systems  Musculoskeletal:        Tingling & numbness on left:  arm, leg, knee, ankle, abdomen, foot  All other systems reviewed and are negative.      Objective:   Physical Exam  No acute distress Mood and affect appropriate Extremities without edema Finger-nose-finger testing on left side moderate dysmetria  sensation is mildly altered on the left side including his face to pinprick. He ambulates without assistive device no evidence of toe drag or knee instability he has difficulty with tandem gait Motor strength is 4/5 in left deltoid muscle strength is 5/5 in the right deltoid muscle tricep grip 5/5 bilateral hip flexors knee extensors ankle dorsiflexors     Assessment & Plan:  1.  Right medullary infarct approximately 1 year post.  He has reached maximal medical improvement.  He still has a moderate ataxia affecting the left upper limb greater than left lower limb.  He also has altered balance.  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  Have completed long-term disability papers I do not expect his condition to improve. I will see him back in 6 months for follow-up Will follow-up with neurology as well as primary care.

## 2023-12-07 ENCOUNTER — Telehealth: Payer: Self-pay

## 2023-12-07 NOTE — Telephone Encounter (Signed)
 Called and left a message about the forms the pt. Needed additional information on. Needed clarification on if the pt. drop forms off here in the clinic, or if he has the forms at home.

## 2023-12-07 NOTE — Telephone Encounter (Signed)
 Pt. Dropped off forms 12/05/2023 to be filled out and has questions about more information needed on the form.

## 2023-12-08 NOTE — Telephone Encounter (Signed)
 Patient called stated that his employer will allow Corey Ide, NP to fill out disability forms in Dr. Sharl Davies absence.

## 2024-01-19 ENCOUNTER — Other Ambulatory Visit

## 2024-01-26 ENCOUNTER — Encounter: Admitting: Nurse Practitioner

## 2024-03-10 ENCOUNTER — Other Ambulatory Visit: Payer: Self-pay | Admitting: Nurse Practitioner

## 2024-03-10 DIAGNOSIS — I639 Cerebral infarction, unspecified: Secondary | ICD-10-CM

## 2024-03-10 DIAGNOSIS — K219 Gastro-esophageal reflux disease without esophagitis: Secondary | ICD-10-CM

## 2024-05-08 ENCOUNTER — Ambulatory Visit (INDEPENDENT_AMBULATORY_CARE_PROVIDER_SITE_OTHER): Payer: Self-pay | Admitting: Neurology

## 2024-05-08 ENCOUNTER — Encounter: Payer: Self-pay | Admitting: Neurology

## 2024-05-08 VITALS — BP 122/79 | HR 70 | Ht 70.0 in | Wt 179.0 lb

## 2024-05-08 DIAGNOSIS — G40219 Localization-related (focal) (partial) symptomatic epilepsy and epileptic syndromes with complex partial seizures, intractable, without status epilepticus: Secondary | ICD-10-CM

## 2024-05-08 MED ORDER — LAMOTRIGINE ER 200 MG PO TB24: 200.0000 mg | ORAL_TABLET | Freq: Every evening | ORAL | 4 refills | Status: AC

## 2024-05-08 MED ORDER — LEVETIRACETAM 500 MG PO TABS: 1500.0000 mg | ORAL_TABLET | Freq: Two times a day (BID) | ORAL | 4 refills | Status: AC

## 2024-05-08 NOTE — Patient Instructions (Signed)
 It's always a pleasure to see you. Wishing you all the best. Continue all your medications. Follow-up in 1 year, call for any changes.    Seizure Precautions: 1. If medication has been prescribed for you to prevent seizures, take it exactly as directed.  Do not stop taking the medicine without talking to your doctor first, even if you have not had a seizure in a long time.   2. Avoid activities in which a seizure would cause danger to yourself or to others.  Don't operate dangerous machinery, swim alone, or climb in high or dangerous places, such as on ladders, roofs, or girders.  Do not drive unless your doctor says you may.  3. If you have any warning that you may have a seizure, lay down in a safe place where you can't hurt yourself.    4.  No driving for 6 months from last seizure, as per [place]  state law.   Please refer to the following link on the Epilepsy Foundation of America's website for more information: http://www.epilepsyfoundation.org/answerplace/Social/driving/drivingu.cfm   5.  Maintain good sleep hygiene. Avoid alcohol.  6.  Contact your doctor if you have any problems that may be related to the medicine you are taking.  7.  Call 911 and bring the patient back to the ED if:        A.  The seizure lasts longer than 5 minutes.       B.  The patient doesn't awaken shortly after the seizure  C.  The patient has new problems such as difficulty seeing, speaking or moving  D.  The patient was injured during the seizure  E.  The patient has a temperature over 102 F (39C)  F.  The patient vomited and now is having trouble breathing
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
## 2024-05-16 ENCOUNTER — Other Ambulatory Visit: Payer: Self-pay | Admitting: Nurse Practitioner

## 2024-05-25 ENCOUNTER — Ambulatory Visit: Admitting: Physical Medicine & Rehabilitation

## 2024-05-29 ENCOUNTER — Encounter: Attending: Physical Medicine & Rehabilitation | Admitting: Physical Medicine & Rehabilitation

## 2024-05-29 ENCOUNTER — Encounter: Payer: Self-pay | Admitting: Physical Medicine & Rehabilitation

## 2024-05-29 VITALS — BP 124/85 | HR 78 | Ht 70.0 in | Wt 180.6 lb

## 2024-05-29 DIAGNOSIS — M87052 Idiopathic aseptic necrosis of left femur: Secondary | ICD-10-CM | POA: Insufficient documentation

## 2024-05-29 DIAGNOSIS — I69393 Ataxia following cerebral infarction: Secondary | ICD-10-CM | POA: Insufficient documentation

## 2024-05-29 DIAGNOSIS — I639 Cerebral infarction, unspecified: Secondary | ICD-10-CM | POA: Insufficient documentation

## 2024-05-29 DIAGNOSIS — G40909 Epilepsy, unspecified, not intractable, without status epilepticus: Secondary | ICD-10-CM | POA: Insufficient documentation

## 2024-05-29 DIAGNOSIS — I69398 Other sequelae of cerebral infarction: Secondary | ICD-10-CM | POA: Insufficient documentation

## 2024-05-29 DIAGNOSIS — R209 Unspecified disturbances of skin sensation: Secondary | ICD-10-CM | POA: Insufficient documentation

## 2024-05-29 NOTE — Progress Notes (Signed)
 Subjective:    Patient ID: Corey Chang, male    DOB: 1981-04-11, 43 y.o.   MRN: [id number]  HPI Discussed the use of AI scribe software for clinical note transcription with the patient, who gave verbal consent to proceed.  History of Present Illness Corey Chang is a 43 year old male with a history of stroke who presents for follow-up regarding post-stroke symptoms.  He experiences persistent numbness on the left side of his body, including his arm and leg, with slight improvement in sensation. He can now feel heat and cold slightly, whereas he could not before. He has regained significant movement and engages in exercises at home to improve his condition.  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 Despite this, he has been cleared to drive, allowing him to assist with family activities such as picking up his children from school.  He experiences weakness and discomfort in his left shoulder. He also has a hip problem that limits certain movements. He prefers to manage his condition through physical activity rather than medication or injections.   Pain Inventory Average Pain 2 Pain Right Now 2 My pain is dull and tingling  In the last 24 hours, has pain interfered with the following? General activity 1 Relation with others 0 Enjoyment of life 0 What TIME of day is your pain at its worst? daytime Sleep (in general) Good  Pain is worse with: walking, bending, standing, and some activites Pain improves with: rest Relief from Meds: na  Family History  Problem Relation Age of Onset   Alzheimer's disease Mother    Diabetes Father    Heart disease Maternal Grandfather        MI   Parkinsonism Paternal Grandmother    Alzheimer's disease Paternal Grandmother    Depression Neg Hx    Drug abuse Neg Hx    Alcohol abuse Neg Hx    Cancer Neg Hx        no colon, prostate, breast, uterine,ovarian   Social History   Socioeconomic  History   Marital status: Married    Spouse name: Magazine Features Editor   Number of children: 2   Years of education: college   Highest education level: Associate degree: occupational, scientist, product/process development, or vocational program  Occupational History   Occupation: sign erector    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[employer]; 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[place], [place]    Employer: [employer]  Tobacco Use   Smoking status: Former    Current packs/day: 0.00    Average packs/day: 0.5 packs/day for 15.0 years (7.5 ttl pk-yrs)    Types: Cigarettes    Start date: 07/20/1991    Quit date: 07/19/2006    Years since quitting: 17.8   Smokeless tobacco: Current    Types: Snuff   Tobacco comments:    Quit 2006  Dipping everyday  Vaping Use   Vaping status: Never Used  Substance and Sexual Activity   Alcohol use: Yes    Alcohol/week: 8.0 standard drinks of alcohol    Types: 2 Cans of beer, 6 Standard drinks or equivalent per week    Comment: 2-3 beer daily   Drug use: No   Sexual activity: Yes    Birth control/protection: Pill  Other Topics Concern   Not on file  Social History Narrative   10/06/21   From: the area   Living: with wife, Duwaine (2008) and children   Work: chief technology officer with [employer]   Family: 2 children - Mollie (2011) and Armed Forces Logistics/support/administrative Officer (2014)      Enjoys: shoot - hunting and shooting      Exercise: trying to get kids to do soccer and play with children in sports   Diet: whatever he wants      Safety   Seat belts: Yes  and occasionally does not   Guns: Yes and secure   Safe in relationships: Yes       Social Drivers of Corporate Investment Banker Strain: Low Risk  (12/07/2022)   Overall Financial Resource Strain (CARDIA)    Difficulty of Paying Living Expenses: Not hard at all  Food Insecurity: No Food Insecurity (12/07/2022)   Hunger Vital Sign    Worried About Running Out of Food in the Last Year: Never true    Ran Out of Food in the Last Year: Never true  Transportation Needs: No Transportation Needs  (12/07/2022)   PRAPARE - Administrator, Civil Service (Medical): No    Lack of Transportation (Non-Medical): No  Physical Activity: Sufficiently Active (12/07/2022)   Exercise Vital Sign    Days of Exercise per Week: 5 days    Minutes of Exercise per Session: 50 min  Stress: No Stress Concern Present (12/07/2022)   [employer] of Occupational Health - Occupational Stress Questionnaire    Feeling of Stress : Not at all  Social Connections: Socially Integrated (12/07/2022)   Social Connection and Isolation Panel    Frequency of Communication with Friends and Family: More than three times a week    Frequency of Social Gatherings with Friends and Family: Once a week    Attends Religious Services: More than 4 times per year    Active Member of [employer] or Organizations: Yes    Attends Engineer, Structural: More than 4 times per year    Marital Status: Married   Past Surgical History:  Procedure Laterality Date   ANKLE SURGERY     right, x 3   brain biopsy  1997, 2001   Past Surgical History:  Procedure Laterality Date   ANKLE SURGERY     right, x 3   brain biopsy  1997, 2001   Past Medical History:  Diagnosis Date   Allergic rhinitis 04/22/1997   Ankle fracture, right 07/2007   Brain cancer (HCC)    Cholelithiasis    Oligodendroglioma (HCC) 2001   Left temporal; bx in 12/1999.  He had external beam radiation in August 2001 with 54 Gy in 30 fractions on RTOG 9802 protocol.   He declined chemotherapy.    Seizure (HCC)    from history of oligodendroglioma   Stroke (HCC) 11/23/2022   BP 124/85   Pulse 78   Ht 5' 10 (1.778 m)   Wt 180 lb 9.6 oz (81.9 kg)   SpO2 98%   BMI 25.91 kg/m   Opioid Risk Score:   Fall Risk Score:  `1  Depression screen PHQ 2/9     05/29/2024    1:58 PM 11/24/2023    2:24 PM 11/23/2023    2:17 PM 10/27/2023   12:46 PM 07/28/2023   12:18 PM 04/19/2023    9:50 AM 02/22/2023    9:22 AM  Depression screen PHQ 2/9  Decreased  Interest 0 0 0 0 0 0 0  Down, Depressed, Hopeless 0 0 0 0 0 0 0  PHQ - 2 Score 0 0 0 0 0 0 0  Altered sleeping  0    0  Tired, decreased energy   0    0  Change in appetite   0    0  Feeling bad or failure about yourself    0    0  Trouble concentrating   0    0  Moving slowly or fidgety/restless   0    0  Suicidal thoughts   0    0  PHQ-9 Score   0     0   Difficult doing work/chores   Not difficult at all    Not difficult at all     Data saved with a previous flowsheet row definition     Review of Systems  Musculoskeletal:        Left side  All other systems reviewed and are negative.      Objective:   Physical Exam  Motor strength is 5/5 in the right deltoid, bicep, tricep, grip, hip flexor, knee extensor, ankle dorsiflexion plantarflexion 4/5 in the left deltoid, bicep, tricep, grip, hip flexor, knee extensor, ankle dorsiflexor and plantar flexor Sensation is reduced to light touch and pinprick in the left upper extremity reduced to light touch in the left lower extremity There is mild ataxia with finger-nose-finger testing left upper extremity.  He has mildly reduced finger to thumb opposition for fine motor testing in the left upper extremity. Ambulates without assistive device he does have an antalgic gait due to hip pain and limitations      Assessment & Plan:   Assessment and Plan Assessment & Plan Right medullary infarct with left-sided sensory and motor deficits Persistent left-sided deficits post-infarct with some sensory improvement. Prefers non-pharmacological management. - Monitor for new or worsening symptoms. - Advised to call 911 if experiencing symptoms suggestive of a new stroke. - Scheduled follow-up in one year unless new issues arise.  Chronic left hip pain and weakness, history of AVN Chronic pain and weakness. Prefers non-pharmacological management. - Continue physical activity to maintain strength and mobility. - Monitor for worsening  symptoms. - Consider further interventions such as injections or ultrasound if symptoms worsen.   seizure disorder, follow-up with neurology.  He has been seizure-free for greater than 6 months and cleared to drive.  They will continue with his Keppra  prescription.

## 2024-06-05 ENCOUNTER — Encounter: Payer: Self-pay | Admitting: Neurology

## 2024-08-07 ENCOUNTER — Other Ambulatory Visit: Payer: Self-pay | Admitting: Family

## 2024-08-07 DIAGNOSIS — K219 Gastro-esophageal reflux disease without esophagitis: Secondary | ICD-10-CM

## 2024-11-23 ENCOUNTER — Encounter: Admitting: Nurse Practitioner

## 2025-05-08 ENCOUNTER — Ambulatory Visit: Payer: Self-pay | Admitting: Neurology

## 2025-05-28 ENCOUNTER — Encounter: Admitting: Physical Medicine & Rehabilitation
